# Patient Record
Sex: Male | Born: 1955
Health system: Southern US, Community
[De-identification: ages and names within clinical notes are randomized; demographics above are authoritative.]

## PROBLEM LIST (undated history)

## (undated) DIAGNOSIS — Z9889 Other specified postprocedural states: Secondary | ICD-10-CM

## (undated) DIAGNOSIS — R7303 Prediabetes: Secondary | ICD-10-CM

## (undated) DIAGNOSIS — K219 Gastro-esophageal reflux disease without esophagitis: Secondary | ICD-10-CM

## (undated) DIAGNOSIS — M1990 Unspecified osteoarthritis, unspecified site: Secondary | ICD-10-CM

## (undated) DIAGNOSIS — J189 Pneumonia, unspecified organism: Secondary | ICD-10-CM

## (undated) DIAGNOSIS — R011 Cardiac murmur, unspecified: Secondary | ICD-10-CM

## (undated) DIAGNOSIS — R112 Nausea with vomiting, unspecified: Secondary | ICD-10-CM

## (undated) DIAGNOSIS — I00 Rheumatic fever without heart involvement: Secondary | ICD-10-CM

## (undated) DIAGNOSIS — T8859XA Other complications of anesthesia, initial encounter: Secondary | ICD-10-CM

## (undated) DIAGNOSIS — E782 Mixed hyperlipidemia: Secondary | ICD-10-CM

## (undated) DIAGNOSIS — R42 Dizziness and giddiness: Secondary | ICD-10-CM

## (undated) DIAGNOSIS — I10 Essential (primary) hypertension: Secondary | ICD-10-CM

## (undated) HISTORY — DX: Prediabetes: R73.03

## (undated) HISTORY — DX: Gastro-esophageal reflux disease without esophagitis: K21.9

## (undated) HISTORY — DX: Cardiac murmur, unspecified: R01.1

## (undated) HISTORY — DX: Unspecified osteoarthritis, unspecified site: M19.90

## (undated) HISTORY — DX: Mixed hyperlipidemia: E78.2

## (undated) HISTORY — DX: Rheumatic fever without heart involvement: I00

---
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
## 1993-06-14 HISTORY — PX: SPINE SURGERY: SHX786

## 2010-06-14 HISTORY — PX: BACK SURGERY: SHX140

## 2011-06-15 HISTORY — PX: JOINT REPLACEMENT: SHX530

## 2014-05-01 ENCOUNTER — Ambulatory Visit (INDEPENDENT_AMBULATORY_CARE_PROVIDER_SITE_OTHER): Payer: 59 | Admitting: Family

## 2014-05-01 ENCOUNTER — Encounter: Payer: Self-pay | Admitting: Family

## 2014-05-01 VITALS — BP 140/92 | HR 63 | Temp 98.3°F | Resp 18 | Ht 74.0 in | Wt 212.1 lb

## 2014-05-01 DIAGNOSIS — M545 Low back pain, unspecified: Secondary | ICD-10-CM

## 2014-05-01 DIAGNOSIS — N200 Calculus of kidney: Secondary | ICD-10-CM

## 2014-05-01 LAB — POCT URINALYSIS DIPSTICK
BILIRUBIN UA: NEGATIVE
Blood, UA: NEGATIVE
Glucose, UA: NEGATIVE
KETONES UA: NEGATIVE
LEUKOCYTES UA: NEGATIVE
Protein, UA: NEGATIVE
Spec Grav, UA: 1.015
Urobilinogen, UA: NEGATIVE
pH, UA: 6

## 2014-05-01 MED ORDER — METHOCARBAMOL 500 MG PO TABS: 500.0000 mg | ORAL_TABLET | Freq: Four times a day (QID) | ORAL | Status: DC | PRN

## 2014-05-01 NOTE — Assessment & Plan Note (Addendum)
Chronic pain currently managed with lidocaine patch. Appears stable at present. Pt would like referral to neurosurgeon for when he is ready for follow up. Start mobic daily as needed for pain. Start robaxin as needed for sleep. Follow up as needed.

## 2014-05-01 NOTE — Assessment & Plan Note (Signed)
Currently asymptomatic. UA was negative. Continue to monitor. If symptoms develop will refer to urology for further management.

## 2014-05-01 NOTE — Progress Notes (Signed)
Pre visit review using our clinic review tool, if applicable. No additional management support is needed unless otherwise documented below in the visit note. 

## 2014-05-01 NOTE — Patient Instructions (Signed)
Thank you for choosing [facility].  Summary/Instructions:  Your prescription(s) have been submitted to your pharmacy. Please take as directed and contact our office if you believe you are having problem(s) with the medication(s).  Referrals have been made during this visit. You should expect to hear back from our schedulers in about 7-10 days in regards to establishing an appointment with the specialists we discussed.   If your symptoms worsen or fail to improve, please contact our office for further instruction, or in case of emergency go directly to the emergency room at the closest medical facility.   [place] - for your neurosurgery options.  Please start your robaxin as needed and let us know how it works.

## 2014-05-01 NOTE — Progress Notes (Signed)
   Subjective:    Patient ID: Joseph [name], male    DOB: 1955/07/08, 58 y.o.   MRN: [id number] . Chief Complaint  Patient presents with  . Establish Care    Discuss back pain and x-ray results    HPI:  Joseph [name] is a 58 y.o. male who presents today to establish care and discuss back pain and x-ray results.   1) X-ray results - Was recently seen for a disability exam and x-rays showed potential for kidney stone and atherosclerosis of abdominal aorta. Recommended follow up of UA to determine status of kidney stone. Currently not experiencing any urinary symptoms or back pain that is related to kidney stones on either side of his back. Does have back pain from previous surgery and that pain has not changed.   2) Back pain - Describes low back pain in the lumbar region that has been fairly consistent. He is limited in the medications that he can take secondary to intolerances to narcotics. The course of his back pain has remained the same without any improvement or worsening. Was recently prescribed a lidocaine patch which has helped somewhat with the pain. Denies any radiculopathy. Extension of his back makes the pain worse.  Previous history of back surgery -- laminectomy L3 for radiculopathy which relieved his radiating pain,   Allergies  Allergen Reactions  . Other     Intolerance to narcotic medication - causes stomach upset and dizziness and does not relieve pain.    No current outpatient prescriptions on file prior to visit.   No current facility-administered medications on file prior to visit.   Past Medical History  Diagnosis Date  . Arthritis   . Heart murmur   . Rheumatic fever   . GERD (gastroesophageal reflux disease)     Review of Systems    See HPI   Objective:    BP 140/92 mmHg  Pulse 63  Temp(Src) 98.3 F (36.8 C) (Oral)  Resp 18  Ht 6\' 2"  (1.88 m)  Wt 212 lb 1.9 oz (96.217 kg)  BMI 27.22 kg/m2  SpO2 97%  Nursing note and vital signs  reviewed.  Physical Exam  Constitutional: He is oriented to person, place, and time. He appears well-developed and well-nourished. No distress.  Cardiovascular: Normal rate, regular rhythm, normal heart sounds and intact distal pulses.   Pulmonary/Chest: Effort normal and breath sounds normal.  Abdominal: There is no CVA tenderness.  Musculoskeletal:  No obvious discoloration or edema of lower back noted. ROM limited in lumbar extension. All other are intact and appropriate. Sensation and reflexes are intact and appropriate bilaterally. (+) SLR on the right side  Neurological: He is alert and oriented to person, place, and time.  Skin: Skin is warm and dry.  Psychiatric: He has a normal mood and affect. His behavior is normal. Judgment and thought content normal.       Assessment & Plan:

## 2014-07-08 ENCOUNTER — Emergency Department (INDEPENDENT_AMBULATORY_CARE_PROVIDER_SITE_OTHER)
Admission: EM | Admit: 2014-07-08 | Discharge: 2014-07-08 | Disposition: A | Discharge status: Home / Self Care | Payer: 59 | Source: Home / Self Care | Attending: Emergency Medicine | Admitting: Emergency Medicine

## 2014-07-08 ENCOUNTER — Encounter (HOSPITAL_COMMUNITY): Payer: Self-pay | Admitting: Emergency Medicine

## 2014-07-08 DIAGNOSIS — B029 Zoster without complications: Secondary | ICD-10-CM | POA: Diagnosis not present

## 2014-07-08 MED ORDER — VALACYCLOVIR HCL 1 G PO TABS: 1000.0000 mg | ORAL_TABLET | Freq: Three times a day (TID) | ORAL | Status: AC

## 2014-07-08 MED ORDER — GABAPENTIN 300 MG PO CAPS: 300.0000 mg | ORAL_CAPSULE | Freq: Three times a day (TID) | ORAL | Status: DC | PRN

## 2014-07-08 NOTE — ED Notes (Signed)
Reports neck pain for 5 days, pain left side of head and into left arm: describes pain as aching.  Patient has a rash to left side of neck and around left ear and into hair

## 2014-07-08 NOTE — Discharge Instructions (Signed)
You likely have shingles. Take Valtrex 1 pill 3 times a day for 7 days. Use gabapentin every 8 hours as needed for pain. You can take 1 g, that 2 extra strength tablets, of Tylenol every 8 hours as needed for pain. You can also take 800 mg, that is 4 over-the-counter ibuprofen tablets, of ibuprofen every 8 hours as needed for pain. You should see improvement by the end of the week.  Follow up here or with her primary care provider as needed.

## 2014-07-08 NOTE — ED Provider Notes (Signed)
CSN: [id number]     Arrival date & time 07/08/14  1107 History   First MD Initiated Contact with Patient 07/08/14 1248     Chief Complaint  Patient presents with  . Neck Pain   (Consider location/radiation/quality/duration/timing/severity/associated sxs/prior Treatment) HPI He is a 59-year-old man here for evaluation of left neck pain and rash. He states 5 days ago he started having pain in the middle and the left side of his neck. It is an achy pain and went on to involve the left side of his head and his left arm as well. He states the pain on the left side of his head is a sensitive type of pain. He also has developed a rash on his left posterior neck, behind his left ear, the left neck, and the left shoulder. He does not remember having chickenpox as a child. He has had 2 cervical spine fusions. No numbness, tingling, weakness in his upper extremities.  Past Medical History  Diagnosis Date  . Arthritis   . Heart murmur   . Rheumatic fever   . GERD (gastroesophageal reflux disease)    Past Surgical History  Procedure Laterality Date  . Spine surgery  1995    C4-5, C5-C6   . Joint replacement Right 2013  . Tonsillectomy  1963   Family History  Problem Relation Age of Onset  . Arthritis Mother     Rheumatoid  . Diabetes Mother   . Arthritis Father   . Hypertension Father   . Diabetes Brother   . Lupus Brother    History  Substance Use Topics  . Smoking status: Former Smoker -- 1.50 packs/day for 20 years    Types: Cigarettes    Quit date: 05/01/1990  . Smokeless tobacco: Never Used  . Alcohol Use: No    Review of Systems  Musculoskeletal: Positive for neck pain.  Skin: Positive for rash.    Allergies  Other  Home Medications   Prior to Admission medications   Medication Sig Start Date End Date Taking? Authorizing Provider  gabapentin (NEURONTIN) 300 MG capsule Take 1 capsule (300 mg total) by mouth 3 (three) times daily as needed. 07/08/14   Melony Overly, MD   lidocaine (LIDODERM) 5 % Place 1 patch onto the skin daily. Remove & Discard patch within 12 hours or as directed by MD    Historical Provider, MD  meloxicam (MOBIC) 15 MG tablet Take 15 mg by mouth daily.    Historical Provider, MD  methocarbamol (ROBAXIN) 500 MG tablet Take 1 tablet (500 mg total) by mouth every 6 (six) hours as needed for muscle spasms. 05/01/14   Mauricio Po, FNP  omeprazole (PRILOSEC) 40 MG capsule Take 40 mg by mouth daily.    Historical Provider, MD  valACYclovir (VALTREX) 1000 MG tablet Take 1 tablet (1,000 mg total) by mouth 3 (three) times daily. 07/08/14 07/22/14  Melony Overly, MD   BP 176/112 mmHg  Pulse 70  Temp(Src) 98.1 F (36.7 C)  Resp 16  SpO2 100% Physical Exam  Constitutional: He is oriented to person, place, and time. He appears well-developed and well-nourished. No distress.  Neck: Neck supple.  Spurling causes neck pain on the left, no radiating pain.  Cardiovascular: Normal rate.   Pulmonary/Chest: Effort normal.  Neurological: He is alert and oriented to person, place, and time. He has normal strength. He exhibits normal muscle tone.  Reflex Scores:      Bicep reflexes are 2+ on the right side and  2+ on the left side.      Brachioradialis reflexes are 2+ on the right side and 2+ on the left side. Skin:  Multiple areas of erythema with overlying vesicles/papules. Located in the left posterior hairline, behind left ear, anterior to left ear, left lateral neck, left shoulder.    ED Course  Procedures (including critical care time) Labs Review Labs Reviewed - No data to display  Imaging Review No results found.   MDM   1. Shingles    Neuro exam is normal. We'll treat with Valtrex for 1 week. Gabapentin 3 times a day as needed for pain. He declined narcotic medication for pain. Reviewed doses of Tylenol and ibuprofen as in after visit summary. Follow-up as needed.    Melony Overly, MD 07/08/14 1345

## 2014-07-10 ENCOUNTER — Ambulatory Visit (INDEPENDENT_AMBULATORY_CARE_PROVIDER_SITE_OTHER): Payer: 59 | Admitting: Family

## 2014-07-10 ENCOUNTER — Encounter: Payer: Self-pay | Admitting: Family

## 2014-07-10 VITALS — BP 150/98 | HR 63 | Temp 98.0°F | Resp 18 | Ht 74.0 in | Wt 213.4 lb

## 2014-07-10 DIAGNOSIS — B029 Zoster without complications: Secondary | ICD-10-CM | POA: Diagnosis not present

## 2014-07-10 MED ORDER — ACETAMINOPHEN-CODEINE #3 300-30 MG PO TABS: 1.0000 | ORAL_TABLET | ORAL | Status: DC | PRN

## 2014-07-10 NOTE — Assessment & Plan Note (Signed)
Symptoms and exam consistent with herpes zoster. Already on Valtrex and gabapentin. Continue current dosage of Valtrex and gabapentin. Start Tylenol #3 as tolerated and needed for pain. Follow up if symptoms worsen or fail to improve.

## 2014-07-10 NOTE — Progress Notes (Signed)
Pre visit review using our clinic review tool, if applicable. No additional management support is needed unless otherwise documented below in the visit note. 

## 2014-07-10 NOTE — Patient Instructions (Signed)
Thank you for choosing [facility].  Summary/Instructions:  If your symptoms worsen or fail to improve, please contact our office for further instruction, or in case of emergency go directly to the emergency room at the closest medical facility.    Shingles Shingles (herpes zoster) is an infection that is caused by the same virus that causes chickenpox (varicella). The infection causes a painful skin rash and fluid-filled blisters, which eventually break open, crust over, and heal. It may occur in any area of the body, but it usually affects only one side of the body or face. The pain of shingles usually lasts about 1 month. However, some people with shingles may develop long-term (chronic) pain in the affected area of the body. Shingles often occurs many years after the person had chickenpox. It is more common:  In people older than 50 years.  In people with weakened immune systems, such as those with HIV, AIDS, or cancer.  In people taking medicines that weaken the immune system, such as transplant medicines.  In people under great stress. CAUSES  Shingles is caused by the varicella zoster virus (VZV), which also causes chickenpox. After a person is infected with the virus, it can remain in the person's body for years in an inactive state (dormant). To cause shingles, the virus reactivates and breaks out as an infection in a nerve root. The virus can be spread from person to person (contagious) through contact with open blisters of the shingles rash. It will only spread to people who have not had chickenpox. When these people are exposed to the virus, they may develop chickenpox. They will not develop shingles. Once the blisters scab over, the person is no longer contagious and cannot spread the virus to others. SIGNS AND SYMPTOMS  Shingles shows up in stages. The initial symptoms may be pain, itching, and tingling in an area of the skin. This pain is usually described as burning,  stabbing, or throbbing.In a few days or weeks, a painful red rash will appear in the area where the pain, itching, and tingling were felt. The rash is usually on one side of the body in a band or belt-like pattern. Then, the rash usually turns into fluid-filled blisters. They will scab over and dry up in approximately 2-3 weeks. Flu-like symptoms may also occur with the initial symptoms, the rash, or the blisters. These may include:  Fever.  Chills.  Headache.  Upset stomach. DIAGNOSIS  Your health care provider will perform a skin exam to diagnose shingles. Skin scrapings or fluid samples may also be taken from the blisters. This sample will be examined under a microscope or sent to a lab for further testing. TREATMENT  There is no specific cure for shingles. Your health care provider will likely prescribe medicines to help you manage the pain, recover faster, and avoid long-term problems. This may include antiviral drugs, anti-inflammatory drugs, and pain medicines. HOME CARE INSTRUCTIONS   Take a cool bath or apply cool compresses to the area of the rash or blisters as directed. This may help with the pain and itching.   Take medicines only as directed by your health care provider.   Rest as directed by your health care provider.  Keep your rash and blisters clean with mild soap and cool water or as directed by your health care provider.  Do not pick your blisters or scratch your rash. Apply an anti-itch cream or numbing creams to the affected area as directed by your health care  provider.  Keep your shingles rash covered with a loose bandage (dressing).  Avoid skin contact with:  Babies.   Pregnant women.   Children with eczema.   Elderly people with transplants.   People with chronic illnesses, such as leukemia or AIDS.   Wear loose-fitting clothing to help ease the pain of material rubbing against the rash.  Keep all follow-up visits as directed by your health  care provider.If the area involved is on your face, you may receive a referral for a specialist, such as an eye doctor (ophthalmologist) or an ear, nose, and throat (ENT) doctor. Keeping all follow-up visits will help you avoid eye problems, chronic pain, or disability.  SEEK IMMEDIATE MEDICAL CARE IF:   You have facial pain, pain around the eye area, or loss of feeling on one side of your face.  You have ear pain or ringing in your ear.  You have loss of taste.  Your pain is not relieved with prescribed medicines.   Your redness or swelling spreads.   You have more pain and swelling.  Your condition is worsening or has changed.   You have a fever. MAKE SURE YOU:  Understand these instructions.  Will watch your condition.  Will get help right away if you are not doing well or get worse. Document Released: 05/31/2005 Document Revised: 10/15/2013 Document Reviewed: 01/13/2012 [facility] Patient Information 2015 [place], [place]. This information is not intended to replace advice given to you by your health care provider. Make sure you discuss any questions you have with your health care provider.

## 2014-07-10 NOTE — Progress Notes (Signed)
   Subjective:    Patient ID: Joseph [name], male    DOB: Feb 27, 1956, 58 y.o.   MRN: [id number]  Chief Complaint  Patient presents with  . Shingles    possible shingles, rash that has spread all over neck and back of ear, nerve pain assited with it    HPI:  Joseph [name] is a 59 y.o. male who presents today   Associated symptoms of a vesicular rash that spread from his neck to the back of his ear  been going on for about a week. Indicates that he has been experiencing neuropathic pain in his neck and around the rash. Was seen at an Urgent Care and diagnosed with shingles and started on Valtrex and gabapentin. Pain continues to be of moderate to high intensity.   Allergies  Allergen Reactions  . Other     Intolerance to narcotic medication - causes stomach upset and dizziness and does not relieve pain.     Current Outpatient Prescriptions on File Prior to Visit  Medication Sig Dispense Refill  . gabapentin (NEURONTIN) 300 MG capsule Take 1 capsule (300 mg total) by mouth 3 (three) times daily as needed. 60 capsule 0  . lidocaine (LIDODERM) 5 % Place 1 patch onto the skin daily. Remove & Discard patch within 12 hours or as directed by MD    . meloxicam (MOBIC) 15 MG tablet Take 15 mg by mouth daily.    . methocarbamol (ROBAXIN) 500 MG tablet Take 1 tablet (500 mg total) by mouth every 6 (six) hours as needed for muscle spasms. 10 tablet 0  . omeprazole (PRILOSEC) 40 MG capsule Take 40 mg by mouth daily.    . valACYclovir (VALTREX) 1000 MG tablet Take 1 tablet (1,000 mg total) by mouth 3 (three) times daily. 21 tablet 0   No current facility-administered medications on file prior to visit.     Review of Systems  Constitutional: Negative for fever and chills.  Musculoskeletal: Positive for neck pain.  Skin: Positive for rash.      Objective:    BP 150/98 mmHg  Pulse 63  Temp(Src) 98 F (36.7 C) (Oral)  Resp 18  Ht 6\' 2"  (1.88 m)  Wt 213 lb 6.4 oz (96.798 kg)  BMI  27.39 kg/m2  SpO2 98% Nursing note and vital signs reviewed.  Physical Exam  Constitutional: He is oriented to person, place, and time. He appears well-developed and well-nourished. No distress.  Cardiovascular: Normal rate, regular rhythm, normal heart sounds and intact distal pulses.   Pulmonary/Chest: Effort normal and breath sounds normal.  Neurological: He is alert and oriented to person, place, and time.  Skin: Skin is warm and dry.  Grouped vesicles noted around C2-C3 dermatome.   Psychiatric: He has a normal mood and affect. His behavior is normal. Judgment and thought content normal.       Assessment & Plan:

## 2014-07-22 ENCOUNTER — Other Ambulatory Visit: Payer: Self-pay

## 2014-07-22 ENCOUNTER — Telehealth: Payer: Self-pay | Admitting: Family

## 2014-07-22 MED ORDER — GABAPENTIN 300 MG PO CAPS: 300.0000 mg | ORAL_CAPSULE | Freq: Three times a day (TID) | ORAL | Status: DC | PRN

## 2014-07-22 NOTE — Telephone Encounter (Signed)
Patient is requesting refill on gabapentin.  Patient uses [facility] in [place]

## 2014-07-22 NOTE — Telephone Encounter (Signed)
Rx sent 

## 2014-07-23 ENCOUNTER — Telehealth: Payer: Self-pay | Admitting: Family

## 2014-07-23 NOTE — Telephone Encounter (Signed)
[facility] in [place] is advising that they have not received gabapentin RX. Can we please fax one more time?

## 2014-07-23 NOTE — Telephone Encounter (Signed)
Faxed this morning.

## 2014-08-02 ENCOUNTER — Other Ambulatory Visit: Payer: Self-pay | Admitting: Family

## 2014-08-05 ENCOUNTER — Other Ambulatory Visit: Payer: Self-pay | Admitting: Family

## 2014-08-05 ENCOUNTER — Telehealth: Payer: Self-pay | Admitting: *Deleted

## 2014-08-05 MED ORDER — GABAPENTIN 300 MG PO CAPS: 600.0000 mg | ORAL_CAPSULE | Freq: Three times a day (TID) | ORAL | Status: DC | PRN

## 2014-08-05 NOTE — Telephone Encounter (Signed)
Pt stated that Joseph [name] told him to double up on his gabapentin, and which he did and is running out early. Pt is take 2 tablets three times a day. Will need updated script sent to pharmacy...Johny Chess

## 2014-08-05 NOTE — Telephone Encounter (Signed)
Left msg on triage stating Marya Amsler told him to double up on his gabapentin he has been taking two pills three times a day. Pt is needing up dated script. Per chart it still has 1 pill three times a day. Is this ok to change...Johny Chess

## 2014-08-05 NOTE — Telephone Encounter (Signed)
New prescription sent to pharmacy 

## 2014-09-13 ENCOUNTER — Other Ambulatory Visit: Payer: Self-pay

## 2014-09-13 ENCOUNTER — Telehealth: Payer: Self-pay | Admitting: Family

## 2014-09-13 DIAGNOSIS — M545 Low back pain, unspecified: Secondary | ICD-10-CM

## 2014-09-13 MED ORDER — MELOXICAM 15 MG PO TABS: 15.0000 mg | ORAL_TABLET | Freq: Every day | ORAL | Status: DC

## 2014-09-13 NOTE — Telephone Encounter (Signed)
Patient wants Meloxicam 15mg  prescribed to him. He claims that the provider knows that he takes this medicine but have not prescribed it for him. Does he need to come in for this?

## 2014-09-13 NOTE — Telephone Encounter (Signed)
erx done

## 2014-11-26 ENCOUNTER — Other Ambulatory Visit: Payer: Self-pay | Admitting: Family

## 2014-12-25 ENCOUNTER — Other Ambulatory Visit: Payer: Self-pay | Admitting: Family

## 2015-02-11 ENCOUNTER — Ambulatory Visit (INDEPENDENT_AMBULATORY_CARE_PROVIDER_SITE_OTHER): Payer: 59 | Admitting: Family

## 2015-02-11 ENCOUNTER — Other Ambulatory Visit (INDEPENDENT_AMBULATORY_CARE_PROVIDER_SITE_OTHER): Payer: 59

## 2015-02-11 ENCOUNTER — Encounter: Payer: Self-pay | Admitting: Family

## 2015-02-11 ENCOUNTER — Telehealth: Payer: Self-pay | Admitting: Family

## 2015-02-11 VITALS — BP 148/84 | HR 68 | Temp 98.3°F | Resp 18 | Ht 74.0 in | Wt 218.0 lb

## 2015-02-11 DIAGNOSIS — I10 Essential (primary) hypertension: Secondary | ICD-10-CM

## 2015-02-11 DIAGNOSIS — M545 Low back pain, unspecified: Secondary | ICD-10-CM

## 2015-02-11 LAB — BASIC METABOLIC PANEL
BUN: 19 mg/dL (ref 6–23)
CHLORIDE: 106 meq/L (ref 96–112)
CO2: 28 mEq/L (ref 19–32)
Calcium: 9.3 mg/dL (ref 8.4–10.5)
Creatinine, Ser: 1.27 mg/dL (ref 0.40–1.50)
GFR: 61.36 mL/min (ref >60.00)
Glucose, Bld: 80 mg/dL (ref 70–99)
POTASSIUM: 4.1 meq/L (ref 3.5–5.1)
SODIUM: 140 meq/L (ref 135–145)

## 2015-02-11 MED ORDER — CYCLOBENZAPRINE HCL 10 MG PO TABS: 10.0000 mg | ORAL_TABLET | Freq: Three times a day (TID) | ORAL | Status: DC | PRN

## 2015-02-11 MED ORDER — NEBIVOLOL HCL 10 MG PO TABS: 10.0000 mg | ORAL_TABLET | Freq: Every day | ORAL | Status: DC

## 2015-02-11 NOTE — Telephone Encounter (Signed)
Please inform patient that his kidney function and electrolytes look fine. No further treatments are needed at this time.

## 2015-02-11 NOTE — Progress Notes (Signed)
Pre visit review using our clinic review tool, if applicable. No additional management support is needed unless otherwise documented below in the visit note. 

## 2015-02-11 NOTE — Assessment & Plan Note (Addendum)
Blood pressure has been elevated recently with lifestyle management. Restart nebivolol to improve blood pressure. Follow up in 2 weeks for blood pressure evaluation. Obtain BMET to check kidney function.

## 2015-02-11 NOTE — Assessment & Plan Note (Signed)
Requests change from robaxin to Flexeril. Discontinue robaxin and start flexeril as needed for muscle spasms. Follow up if symptoms worsen or are no longer improved.

## 2015-02-11 NOTE — Progress Notes (Signed)
Subjective:    Patient ID: Joseph [name], male    DOB: 1956/03/22, 59 y.o.   MRN: [id number]  Chief Complaint  Patient presents with  . Hypertension    states that his BP has been up and down alot but its been more up the last couple of weeks    HPI:  Neville Walston is a 59 y.o. male with a PMH of shingles, low back pain and kidney stones who presents today for an acute office visit.   Previously controlled on blood pressure medication and experiencing the associated symptom of increased blood pressure for the last couple of weeks and reports that it waxes and wanes. Also notes that he has been doing more activities and working which increase his blood pressure. Modifying factors previously included Bystolic which he has not taken in a long time.   Allergies  Allergen Reactions  . Other     Intolerance to narcotic medication - causes stomach upset and dizziness and does not relieve pain.     Current Outpatient Prescriptions on File Prior to Visit  Medication Sig Dispense Refill  . gabapentin (NEURONTIN) 300 MG capsule TAKE 2 CAPSULES BY MOUTH 3 TIMES A DAY AS NEEDED 180 capsule 2  . lidocaine (LIDODERM) 5 % Place 1 patch onto the skin daily. Remove & Discard patch within 12 hours or as directed by MD    . meloxicam (MOBIC) 15 MG tablet TAKE 1 TABLET EVERY DAY 30 tablet 2  . omeprazole (PRILOSEC) 40 MG capsule Take 40 mg by mouth daily.     No current facility-administered medications on file prior to visit.    Review of Systems  Constitutional: Negative for fever and chills.  Eyes:       Negative for changes vision.   Respiratory: Negative for chest tightness and shortness of breath.   Cardiovascular: Negative for chest pain, palpitations and leg swelling.  Neurological: Negative for headaches.      Objective:    BP 148/84 mmHg  Pulse 68  Temp(Src) 98.3 F (36.8 C) (Oral)  Resp 18  Ht 6\' 2"  (1.88 m)  Wt 218 lb (98.884 kg)  BMI 27.98 kg/m2  SpO2 97% Nursing  note and vital signs reviewed.  Physical Exam  Constitutional: He is oriented to person, place, and time. He appears well-developed and well-nourished. No distress.  Cardiovascular: Normal rate, regular rhythm, normal heart sounds and intact distal pulses.   Pulmonary/Chest: Effort normal and breath sounds normal.  Neurological: He is alert and oriented to person, place, and time.  Skin: Skin is warm and dry.  Psychiatric: He has a normal mood and affect. His behavior is normal. Judgment and thought content normal.       Assessment & Plan:   Problem List Items Addressed This Visit      Cardiovascular and Mediastinum   Essential hypertension - Primary    Blood pressure has been elevated recently with lifestyle management. Restart nebivolol to improve blood pressure. Follow up in 2 weeks for blood pressure evaluation. Obtain BMET to check kidney function.       Relevant Medications   nebivolol (BYSTOLIC) 10 MG tablet   Other Relevant Orders   Basic Metabolic Panel (BMET)     Other   Low back pain    Requests change from robaxin to Flexeril. Discontinue robaxin and start flexeril as needed for muscle spasms. Follow up if symptoms worsen or are no longer improved.       Relevant Medications  cyclobenzaprine (FLEXERIL) 10 MG tablet

## 2015-02-11 NOTE — Patient Instructions (Addendum)
Thank you for choosing [facility].  Summary/Instructions:  Your prescription(s) have been submitted to your pharmacy or been printed and provided for you. Please take as directed and contact our office if you believe you are having problem(s) with the medication(s) or have any questions.  If your symptoms worsen or fail to improve, please contact our office for further instruction, or in case of emergency go directly to the emergency room at the closest medical facility.   Please continue to monitor your blood pressure at home.

## 2015-02-12 NOTE — Telephone Encounter (Signed)
Pt aware of results 

## 2015-02-12 NOTE — Telephone Encounter (Signed)
LVM for pt to call back.

## 2015-03-14 ENCOUNTER — Telehealth: Payer: Self-pay | Admitting: Family

## 2015-03-14 NOTE — Telephone Encounter (Signed)
Pt called and said that he lost he health ins.  He can not afford his Bysloic . Does Marya Amsler have any suggestion for this pt to get his meds?    Best number [phone]

## 2015-03-14 NOTE — Telephone Encounter (Signed)
There are two different options - the first is to switch medications to something that is cheaper and the second is to contact the drug company for a drug assistance request (this can usually be done through the drug website ([employer])

## 2015-03-27 DIAGNOSIS — R937 Abnormal findings on diagnostic imaging of other parts of musculoskeletal system: Secondary | ICD-10-CM | POA: Diagnosis not present

## 2015-03-27 DIAGNOSIS — M5412 Radiculopathy, cervical region: Secondary | ICD-10-CM | POA: Diagnosis not present

## 2015-03-27 DIAGNOSIS — M65311 Trigger thumb, right thumb: Secondary | ICD-10-CM | POA: Diagnosis not present

## 2015-03-27 DIAGNOSIS — M19019 Primary osteoarthritis, unspecified shoulder: Secondary | ICD-10-CM | POA: Diagnosis not present

## 2015-03-27 DIAGNOSIS — M47812 Spondylosis without myelopathy or radiculopathy, cervical region: Secondary | ICD-10-CM | POA: Diagnosis not present

## 2015-03-28 DIAGNOSIS — M47812 Spondylosis without myelopathy or radiculopathy, cervical region: Secondary | ICD-10-CM | POA: Diagnosis not present

## 2015-03-28 DIAGNOSIS — M19019 Primary osteoarthritis, unspecified shoulder: Secondary | ICD-10-CM | POA: Diagnosis not present

## 2015-03-28 DIAGNOSIS — M65311 Trigger thumb, right thumb: Secondary | ICD-10-CM | POA: Diagnosis not present

## 2015-04-21 ENCOUNTER — Other Ambulatory Visit: Payer: Self-pay | Admitting: Family

## 2015-04-21 NOTE — Telephone Encounter (Signed)
Last refill was 6/14 with quantity of 180 and 2 refills. Please advise

## 2015-04-30 ENCOUNTER — Encounter: Payer: Self-pay | Admitting: Family

## 2015-04-30 ENCOUNTER — Ambulatory Visit (INDEPENDENT_AMBULATORY_CARE_PROVIDER_SITE_OTHER): Payer: Medicare Other | Admitting: Family

## 2015-04-30 VITALS — BP 138/90 | HR 83 | Temp 97.8°F | Resp 18 | Ht 74.0 in | Wt 215.0 lb

## 2015-04-30 DIAGNOSIS — M545 Low back pain, unspecified: Secondary | ICD-10-CM

## 2015-04-30 MED ORDER — MELOXICAM 15 MG PO TABS: 15.0000 mg | ORAL_TABLET | Freq: Every day | ORAL | Status: DC

## 2015-04-30 NOTE — Assessment & Plan Note (Signed)
Symptoms and exam consistent with lumbar disc pathology that has been refractory to home exercise therapy. Obtain MRI. Refer to physical therapy and neurosurgery for further management. Continue current dosage of meloxicam, lidocaine patch, and gabapentin as needed. Continue home exercise therapy with exercises provided. Recommend heat 2-3 times per day or as needed. Follow-up pending physical therapy referral and neurosurgery referral.

## 2015-04-30 NOTE — Progress Notes (Signed)
Subjective:    Patient ID: Joseph [name], male    DOB: 02-Jan-1956, 59 y.o.   MRN: [id number]  Chief Complaint  Patient presents with  . Medication Management    having back pain and wants to know if he can get MRI    HPI:  Joseph [name] is a 59 y.o. male who  has a past medical history of Arthritis; Heart murmur; Rheumatic fever; and GERD (gastroesophageal reflux disease). and presents today for a follow up office visit.    Back issues - Continues to experience the associated symptom of pain located in his lower back and does express some radiculopathy down the left side. The new onset of pain has worsened in the last few months. Describes the pain as interfering with moving and walking. Previous history of micropscopic discectomy in his lower back about 5 years ago. Modifying factors include cyclobenzaprine and gabapentin which which does help with his symptoms. Relieving factors are sitting and resting. Does effect his ADLs and ability to work due to pain. Denies saddle anesthesia or changes to bowel or bladder habits.   Allergies  Allergen Reactions  . Other     Intolerance to narcotic medication - causes stomach upset and dizziness and does not relieve pain.      Current Outpatient Prescriptions on File Prior to Visit  Medication Sig Dispense Refill  . cyclobenzaprine (FLEXERIL) 10 MG tablet Take 1 tablet (10 mg total) by mouth 3 (three) times daily as needed for muscle spasms. 90 tablet 0  . gabapentin (NEURONTIN) 300 MG capsule TAKE 2 CAPSULES BY MOUTH 3 TIMES A DAY AS NEEDED 180 capsule 2  . lidocaine (LIDODERM) 5 % Place 1 patch onto the skin daily. Remove & Discard patch within 12 hours or as directed by MD    . nebivolol (BYSTOLIC) 10 MG tablet Take 1 tablet (10 mg total) by mouth daily. 90 tablet 0  . omeprazole (PRILOSEC) 40 MG capsule Take 40 mg by mouth daily.     No current facility-administered medications on file prior to visit.     Review of Systems    Constitutional: Negative for fever and chills.  Musculoskeletal: Positive for back pain.  Neurological: Negative for weakness and numbness.      Objective:    BP 138/90 mmHg  Pulse 83  Temp(Src) 97.8 F (36.6 C) (Oral)  Resp 18  Ht 6\' 2"  (1.88 m)  Wt 215 lb (97.523 kg)  BMI 27.59 kg/m2  SpO2 97% Nursing note and vital signs reviewed.  Physical Exam  Constitutional: He is oriented to person, place, and time. He appears well-developed and well-nourished. No distress.  Cardiovascular: Normal rate, regular rhythm, normal heart sounds and intact distal pulses.   Pulmonary/Chest: Effort normal and breath sounds normal.  Musculoskeletal:  Low back - no obvious deformity, discoloration, or edema noted. Tenderness elicited along lumbar spine at L4-L5 level. Range of motion is decreased in flexion to approximately half normal. Lateral bending to the left results in discomfort. Straight leg raise is positive bilaterally. Faber's test is negative. Distal pulses, sensation, and reflexes are intact and appropriate.  Neurological: He is alert and oriented to person, place, and time.  Skin: Skin is warm and dry.  Psychiatric: He has a normal mood and affect. His behavior is normal. Judgment and thought content normal.       Assessment & Plan:   Problem List Items Addressed This Visit      Other   Low back pain -  Primary    Symptoms and exam consistent with lumbar disc pathology that has been refractory to home exercise therapy. Obtain MRI. Refer to physical therapy and neurosurgery for further management. Continue current dosage of meloxicam, lidocaine patch, and gabapentin as needed. Continue home exercise therapy with exercises provided. Recommend heat 2-3 times per day or as needed. Follow-up pending physical therapy referral and neurosurgery referral.      Relevant Medications   meloxicam (MOBIC) 15 MG tablet   Other Relevant Orders   MR Lumbar Spine Wo Contrast   Ambulatory referral  to Physical Therapy   Ambulatory referral to Neurosurgery

## 2015-04-30 NOTE — Progress Notes (Signed)
Pre visit review using our clinic review tool, if applicable. No additional management support is needed unless otherwise documented below in the visit note. 

## 2015-04-30 NOTE — Patient Instructions (Signed)
Thank you for choosing [facility].  Summary/Instructions:  Your prescription(s) have been submitted to your pharmacy or been printed and provided for you. Please take as directed and contact our office if you believe you are having problem(s) with the medication(s) or have any questions.  If your symptoms worsen or fail to improve, please contact our office for further instruction, or in case of emergency go directly to the emergency room at the closest medical facility.   Sciatica With Rehab The sciatic nerve runs from the back down the leg and is responsible for sensation and control of the muscles in the back (posterior) side of the thigh, lower leg, and foot. Sciatica is a condition that is characterized by inflammation of this nerve.  SYMPTOMS   Signs of nerve damage, including numbness and/or weakness along the posterior side of the lower extremity.  Pain in the back of the thigh that may also travel down the leg.  Pain that worsens when sitting for long periods of time.  Occasionally, pain in the back or buttock. CAUSES  Inflammation of the sciatic nerve is the cause of sciatica. The inflammation is due to something irritating the nerve. Common sources of irritation include:  Sitting for long periods of time.  Direct trauma to the nerve.  Arthritis of the spine.  Herniated or ruptured disk.  Slipping of the vertebrae (spondylolisthesis).  Pressure from soft tissues, such as muscles or ligament-like tissue (fascia). RISK INCREASES WITH:  Sports that place pressure or stress on the spine (football or weightlifting).  Poor strength and flexibility.  Failure to warm up properly before activity.  Family history of low back pain or disk disorders.  Previous back injury or surgery.  Poor body mechanics, especially when lifting, or poor posture. PREVENTION   Warm up and stretch properly before activity.  Maintain physical fitness:  Strength, flexibility, and  endurance.  Cardiovascular fitness.  Learn and use proper technique, especially with posture and lifting. When possible, have coach correct improper technique.  Avoid activities that place stress on the spine. PROGNOSIS If treated properly, then sciatica usually resolves within 6 weeks. However, occasionally surgery is necessary.  RELATED COMPLICATIONS   Permanent nerve damage, including pain, numbness, tingle, or weakness.  Chronic back pain.  Risks of surgery: infection, bleeding, nerve damage, or damage to surrounding tissues. TREATMENT Treatment initially involves resting from any activities that aggravate your symptoms. The use of ice and medication may help reduce pain and inflammation. The use of strengthening and stretching exercises may help reduce pain with activity. These exercises may be performed at home or with referral to a therapist. A therapist may recommend further treatments, such as transcutaneous electronic nerve stimulation (TENS) or ultrasound. Your caregiver may recommend corticosteroid injections to help reduce inflammation of the sciatic nerve. If symptoms persist despite non-surgical (conservative) treatment, then surgery may be recommended. MEDICATION  If pain medication is necessary, then nonsteroidal anti-inflammatory medications, such as aspirin and ibuprofen, or other minor pain relievers, such as acetaminophen, are often recommended.  Do not take pain medication for 7 days before surgery.  Prescription pain relievers may be given if deemed necessary by your caregiver. Use only as directed and only as much as you need.  Ointments applied to the skin may be helpful.  Corticosteroid injections may be given by your caregiver. These injections should be reserved for the most serious cases, because they may only be given a certain number of times. HEAT AND COLD  Cold treatment (icing) relieves  pain and reduces inflammation. Cold treatment should be applied  for 10 to 15 minutes every 2 to 3 hours for inflammation and pain and immediately after any activity that aggravates your symptoms. Use ice packs or massage the area with a piece of ice (ice massage).  Heat treatment may be used prior to performing the stretching and strengthening activities prescribed by your caregiver, physical therapist, or athletic trainer. Use a heat pack or soak the injury in warm water. SEEK MEDICAL CARE IF:  Treatment seems to offer no benefit, or the condition worsens.  Any medications produce adverse side effects. EXERCISES  RANGE OF MOTION (ROM) AND STRETCHING EXERCISES - Sciatica Most people with sciatic will find that their symptoms worsen with either excessive bending forward (flexion) or arching at the low back (extension). The exercises which will help resolve your symptoms will focus on the opposite motion. Your physician, physical therapist or athletic trainer will help you determine which exercises will be most helpful to resolve your low back pain. Do not complete any exercises without first consulting with your clinician. Discontinue any exercises which worsen your symptoms until you speak to your clinician. If you have pain, numbness or tingling which travels down into your buttocks, leg or foot, the goal of the therapy is for these symptoms to move closer to your back and eventually resolve. Occasionally, these leg symptoms will get better, but your low back pain may worsen; this is typically an indication of progress in your rehabilitation. Be certain to be very alert to any changes in your symptoms and the activities in which you participated in the 24 hours prior to the change. Sharing this information with your clinician will allow him/her to most efficiently treat your condition. These exercises may help you when beginning to rehabilitate your injury. Your symptoms may resolve with or without further involvement from your physician, physical therapist or  athletic trainer. While completing these exercises, remember:   Restoring tissue flexibility helps normal motion to return to the joints. This allows healthier, less painful movement and activity.  An effective stretch should be held for at least 30 seconds.  A stretch should never be painful. You should only feel a gentle lengthening or release in the stretched tissue. FLEXION RANGE OF MOTION AND STRETCHING EXERCISES: STRETCH - Flexion, Single Knee to Chest   Lie on a firm bed or floor with both legs extended in front of you.  Keeping one leg in contact with the floor, bring your opposite knee to your chest. Hold your leg in place by either grabbing behind your thigh or at your knee.  Pull until you feel a gentle stretch in your low back. Hold __________ seconds.  Slowly release your grasp and repeat the exercise with the opposite side. Repeat __________ times. Complete this exercise __________ times per day.  STRETCH - Flexion, Double Knee to Chest  Lie on a firm bed or floor with both legs extended in front of you.  Keeping one leg in contact with the floor, bring your opposite knee to your chest.  Tense your stomach muscles to support your back and then lift your other knee to your chest. Hold your legs in place by either grabbing behind your thighs or at your knees.  Pull both knees toward your chest until you feel a gentle stretch in your low back. Hold __________ seconds.  Tense your stomach muscles and slowly return one leg at a time to the floor. Repeat __________ times. Complete this exercise  __________ times per day.  STRETCH - Low Trunk Rotation   Lie on a firm bed or floor. Keeping your legs in front of you, bend your knees so they are both pointed toward the ceiling and your feet are flat on the floor.  Extend your arms out to the side. This will stabilize your upper body by keeping your shoulders in contact with the floor.  Gently and slowly drop both knees together  to one side until you feel a gentle stretch in your low back. Hold for __________ seconds.  Tense your stomach muscles to support your low back as you bring your knees back to the starting position. Repeat the exercise to the other side. Repeat __________ times. Complete this exercise __________ times per day  EXTENSION RANGE OF MOTION AND FLEXIBILITY EXERCISES: STRETCH - Extension, Prone on Elbows  Lie on your stomach on the floor, a bed will be too soft. Place your palms about shoulder width apart and at the height of your head.  Place your elbows under your shoulders. If this is too painful, stack pillows under your chest.  Allow your body to relax so that your hips drop lower and make contact more completely with the floor.  Hold this position for __________ seconds.  Slowly return to lying flat on the floor. Repeat __________ times. Complete this exercise __________ times per day.  RANGE OF MOTION - Extension, Prone Press Ups  Lie on your stomach on the floor, a bed will be too soft. Place your palms about shoulder width apart and at the height of your head.  Keeping your back as relaxed as possible, slowly straighten your elbows while keeping your hips on the floor. You may adjust the placement of your hands to maximize your comfort. As you gain motion, your hands will come more underneath your shoulders.  Hold this position __________ seconds.  Slowly return to lying flat on the floor. Repeat __________ times. Complete this exercise __________ times per day.  STRENGTHENING EXERCISES - Sciatica  These exercises may help you when beginning to rehabilitate your injury. These exercises should be done near your "sweet spot." This is the neutral, low-back arch, somewhere between fully rounded and fully arched, that is your least painful position. When performed in this safe range of motion, these exercises can be used for people who have either a flexion or extension based injury. These  exercises may resolve your symptoms with or without further involvement from your physician, physical therapist or athletic trainer. While completing these exercises, remember:   Muscles can gain both the endurance and the strength needed for everyday activities through controlled exercises.  Complete these exercises as instructed by your physician, physical therapist or athletic trainer. Progress with the resistance and repetition exercises only as your caregiver advises.  You may experience muscle soreness or fatigue, but the pain or discomfort you are trying to eliminate should never worsen during these exercises. If this pain does worsen, stop and make certain you are following the directions exactly. If the pain is still present after adjustments, discontinue the exercise until you can discuss the trouble with your clinician. STRENGTHENING - Deep Abdominals, Pelvic Tilt   Lie on a firm bed or floor. Keeping your legs in front of you, bend your knees so they are both pointed toward the ceiling and your feet are flat on the floor.  Tense your lower abdominal muscles to press your low back into the floor. This motion will rotate your pelvis so  that your tail bone is scooping upwards rather than pointing at your feet or into the floor.  With a gentle tension and even breathing, hold this position for __________ seconds. Repeat __________ times. Complete this exercise __________ times per day.  STRENGTHENING - Abdominals, Crunches   Lie on a firm bed or floor. Keeping your legs in front of you, bend your knees so they are both pointed toward the ceiling and your feet are flat on the floor. Cross your arms over your chest.  Slightly tip your chin down without bending your neck.  Tense your abdominals and slowly lift your trunk high enough to just clear your shoulder blades. Lifting higher can put excessive stress on the low back and does not further strengthen your abdominal muscles.  Control  your return to the starting position. Repeat __________ times. Complete this exercise __________ times per day.  STRENGTHENING - Quadruped, Opposite UE/LE Lift  Assume a hands and knees position on a firm surface. Keep your hands under your shoulders and your knees under your hips. You may place padding under your knees for comfort.  Find your neutral spine and gently tense your abdominal muscles so that you can maintain this position. Your shoulders and hips should form a rectangle that is parallel with the floor and is not twisted.  Keeping your trunk steady, lift your right hand no higher than your shoulder and then your left leg no higher than your hip. Make sure you are not holding your breath. Hold this position __________ seconds.  Continuing to keep your abdominal muscles tense and your back steady, slowly return to your starting position. Repeat with the opposite arm and leg. Repeat __________ times. Complete this exercise __________ times per day.  STRENGTHENING - Abdominals and Quadriceps, Straight Leg Raise   Lie on a firm bed or floor with both legs extended in front of you.  Keeping one leg in contact with the floor, bend the other knee so that your foot can rest flat on the floor.  Find your neutral spine, and tense your abdominal muscles to maintain your spinal position throughout the exercise.  Slowly lift your straight leg off the floor about 6 inches for a count of 15, making sure to not hold your breath.  Still keeping your neutral spine, slowly lower your leg all the way to the floor. Repeat this exercise with each leg __________ times. Complete this exercise __________ times per day. POSTURE AND BODY MECHANICS CONSIDERATIONS - Sciatica Keeping correct posture when sitting, standing or completing your activities will reduce the stress put on different body tissues, allowing injured tissues a chance to heal and limiting painful experiences. The following are general  guidelines for improved posture. Your physician or physical therapist will provide you with any instructions specific to your needs. While reading these guidelines, remember:  The exercises prescribed by your provider will help you have the flexibility and strength to maintain correct postures.  The correct posture provides the optimal environment for your joints to work. All of your joints have less wear and tear when properly supported by a spine with good posture. This means you will experience a healthier, less painful body.  Correct posture must be practiced with all of your activities, especially prolonged sitting and standing. Correct posture is as important when doing repetitive low-stress activities (typing) as it is when doing a single heavy-load activity (lifting). RESTING POSITIONS Consider which positions are most painful for you when choosing a resting position. If you  have pain with flexion-based activities (sitting, bending, stooping, squatting), choose a position that allows you to rest in a less flexed posture. You would want to avoid curling into a fetal position on your side. If your pain worsens with extension-based activities (prolonged standing, working overhead), avoid resting in an extended position such as sleeping on your stomach. Most people will find more comfort when they rest with their spine in a more neutral position, neither too rounded nor too arched. Lying on a non-sagging bed on your side with a pillow between your knees, or on your back with a pillow under your knees will often provide some relief. Keep in mind, being in any one position for a prolonged period of time, no matter how correct your posture, can still lead to stiffness. PROPER SITTING POSTURE In order to minimize stress and discomfort on your spine, you must sit with correct posture Sitting with good posture should be effortless for a healthy body. Returning to good posture is a gradual process. Many  people can work toward this most comfortably by using various supports until they have the flexibility and strength to maintain this posture on their own. When sitting with proper posture, your ears will fall over your shoulders and your shoulders will fall over your hips. You should use the back of the chair to support your upper back. Your low back will be in a neutral position, just slightly arched. You may place a small pillow or folded towel at the base of your low back for support.  When working at a desk, create an environment that supports good, upright posture. Without extra support, muscles fatigue and lead to excessive strain on joints and other tissues. Keep these recommendations in mind: CHAIR:   A chair should be able to slide under your desk when your back makes contact with the back of the chair. This allows you to work closely.  The chair's height should allow your eyes to be level with the upper part of your monitor and your hands to be slightly lower than your elbows. BODY POSITION  Your feet should make contact with the floor. If this is not possible, use a foot rest.  Keep your ears over your shoulders. This will reduce stress on your neck and low back. INCORRECT SITTING POSTURES   If you are feeling tired and unable to assume a healthy sitting posture, do not slouch or slump. This puts excessive strain on your back tissues, causing more damage and pain. Healthier options include:  Using more support, like a lumbar pillow.  Switching tasks to something that requires you to be upright or walking.  Talking a brief walk.  Lying down to rest in a neutral-spine position. PROLONGED STANDING WHILE SLIGHTLY LEANING FORWARD  When completing a task that requires you to lean forward while standing in one place for a long time, place either foot up on a stationary 2-4 inch high object to help maintain the best posture. When both feet are on the ground, the low back tends to lose its  slight inward curve. If this curve flattens (or becomes too large), then the back and your other joints will experience too much stress, fatigue more quickly and can cause pain.  CORRECT STANDING POSTURES Proper standing posture should be assumed with all daily activities, even if they only take a few moments, like when brushing your teeth. As in sitting, your ears should fall over your shoulders and your shoulders should fall over your hips. You  should keep a slight tension in your abdominal muscles to brace your spine. Your tailbone should point down to the ground, not behind your body, resulting in an over-extended swayback posture.  INCORRECT STANDING POSTURES  Common incorrect standing postures include a forward head, locked knees and/or an excessive swayback. WALKING Walk with an upright posture. Your ears, shoulders and hips should all line-up. PROLONGED ACTIVITY IN A FLEXED POSITION When completing a task that requires you to bend forward at your waist or lean over a low surface, try to find a way to stabilize 3 of 4 of your limbs. You can place a hand or elbow on your thigh or rest a knee on the surface you are reaching across. This will provide you more stability so that your muscles do not fatigue as quickly. By keeping your knees relaxed, or slightly bent, you will also reduce stress across your low back. CORRECT LIFTING TECHNIQUES DO :   Assume a wide stance. This will provide you more stability and the opportunity to get as close as possible to the object which you are lifting.  Tense your abdominals to brace your spine; then bend at the knees and hips. Keeping your back locked in a neutral-spine position, lift using your leg muscles. Lift with your legs, keeping your back straight.  Test the weight of unknown objects before attempting to lift them.  Try to keep your elbows locked down at your sides in order get the best strength from your shoulders when carrying an object.  Always  ask for help when lifting heavy or awkward objects. INCORRECT LIFTING TECHNIQUES DO NOT:   Lock your knees when lifting, even if it is a small object.  Bend and twist. Pivot at your feet or move your feet when needing to change directions.  Assume that you cannot safely pick up a paperclip without proper posture.   This information is not intended to replace advice given to you by your health care provider. Make sure you discuss any questions you have with your health care provider.   Document Released: 05/31/2005 Document Revised: 10/15/2014 Document Reviewed: 09/12/2008 [employer]vier Interactive Patient Education [employer].

## 2015-05-15 ENCOUNTER — Other Ambulatory Visit: Payer: Self-pay | Admitting: Family

## 2015-05-15 DIAGNOSIS — M545 Low back pain, unspecified: Secondary | ICD-10-CM

## 2015-05-23 ENCOUNTER — Telehealth: Payer: Self-pay | Admitting: *Deleted

## 2015-05-23 DIAGNOSIS — M545 Low back pain, unspecified: Secondary | ICD-10-CM

## 2015-05-23 MED ORDER — OMEPRAZOLE 40 MG PO CPDR: 40.0000 mg | DELAYED_RELEASE_CAPSULE | Freq: Every day | ORAL | Status: DC

## 2015-05-23 MED ORDER — MELOXICAM 15 MG PO TABS: 15.0000 mg | ORAL_TABLET | Freq: Every day | ORAL | Status: DC

## 2015-05-23 MED ORDER — GABAPENTIN 300 MG PO CAPS: ORAL_CAPSULE | ORAL | Status: DC

## 2015-05-23 NOTE — Telephone Encounter (Signed)
Received call pt states he has to use mail service "[employer]" for his omeperazole, meloxicam & Gabapentin. Inform pt will send [employer] new rx;'s...Joseph [name]

## 2015-05-26 ENCOUNTER — Other Ambulatory Visit: Payer: Self-pay | Admitting: Family

## 2015-05-28 ENCOUNTER — Ambulatory Visit
Admission: RE | Admit: 2015-05-28 | Discharge: 2015-05-28 | Disposition: A | Discharge status: Home / Self Care | Payer: Medicare Other | Source: Ambulatory Visit | Attending: Family | Admitting: Family

## 2015-05-28 DIAGNOSIS — M545 Low back pain, unspecified: Secondary | ICD-10-CM

## 2015-05-28 DIAGNOSIS — M4806 Spinal stenosis, lumbar region: Secondary | ICD-10-CM | POA: Diagnosis not present

## 2015-05-28 DIAGNOSIS — Z01818 Encounter for other preprocedural examination: Secondary | ICD-10-CM | POA: Diagnosis not present

## 2015-06-04 DIAGNOSIS — Z6827 Body mass index (BMI) 27.0-27.9, adult: Secondary | ICD-10-CM | POA: Diagnosis not present

## 2015-06-04 DIAGNOSIS — M5136 Other intervertebral disc degeneration, lumbar region: Secondary | ICD-10-CM | POA: Diagnosis not present

## 2015-06-04 DIAGNOSIS — I10 Essential (primary) hypertension: Secondary | ICD-10-CM | POA: Diagnosis not present

## 2015-06-19 ENCOUNTER — Telehealth: Payer: Self-pay | Admitting: Family

## 2015-06-19 DIAGNOSIS — M545 Low back pain, unspecified: Secondary | ICD-10-CM

## 2015-06-19 NOTE — Telephone Encounter (Signed)
Pt called in and he needs a refill on his lidocaine (LIDODERM) 5 % [id number] .  He said that the pharmacy told him that he would need a PA for it?  Have you seen this refill or a PA for this ?????

## 2015-06-26 MED ORDER — LIDOCAINE 5 % EX PTCH: 1.0000 | MEDICATED_PATCH | CUTANEOUS | Status: DC

## 2015-06-26 NOTE — Telephone Encounter (Signed)
Pt left msg on triage inquiring about PA status on his Lidoderm. Corrine or Taylor pls contact pt concerning PA...Joseph [name]

## 2015-06-26 NOTE — Telephone Encounter (Signed)
The medication was never sent in by [facility] so we never received PA for it. Joseph [name] ok'd sending the lidoderm in. Sent in the rx and will start on the PA

## 2015-06-27 NOTE — Telephone Encounter (Signed)
PA done for lidocaine patch 5% waiting on response

## 2015-07-04 DIAGNOSIS — M5416 Radiculopathy, lumbar region: Secondary | ICD-10-CM | POA: Diagnosis not present

## 2015-07-04 DIAGNOSIS — M5136 Other intervertebral disc degeneration, lumbar region: Secondary | ICD-10-CM | POA: Diagnosis not present

## 2015-07-04 NOTE — Telephone Encounter (Signed)
Fax received that PA has been denied.   Forwarding to PCP and Assistance for alternative solutions.

## 2015-07-08 NOTE — Telephone Encounter (Signed)
Spoke with pts wife and she advised that pt was in pain management clinic. I made her aware that the pain management clinic should be handling the pain and to let them know that lidocaine patches work best for him. Pts wife is going to call Pain management. They are aware PA has been denied.

## 2015-07-08 NOTE — Telephone Encounter (Signed)
Pt left msg on triage stating needing status on Lidocaine Patches. MD order 2 weeks ago no one has called him to inform him of anything. Lovena Le will you contact pt concerning PA...Johny Chess

## 2015-07-28 DIAGNOSIS — M5136 Other intervertebral disc degeneration, lumbar region: Secondary | ICD-10-CM | POA: Diagnosis not present

## 2015-07-28 DIAGNOSIS — M5416 Radiculopathy, lumbar region: Secondary | ICD-10-CM | POA: Diagnosis not present

## 2015-08-14 ENCOUNTER — Other Ambulatory Visit: Payer: Self-pay | Admitting: Family

## 2015-08-18 DIAGNOSIS — M5416 Radiculopathy, lumbar region: Secondary | ICD-10-CM | POA: Diagnosis not present

## 2015-09-10 DIAGNOSIS — M5136 Other intervertebral disc degeneration, lumbar region: Secondary | ICD-10-CM | POA: Diagnosis not present

## 2015-09-10 DIAGNOSIS — M5416 Radiculopathy, lumbar region: Secondary | ICD-10-CM | POA: Diagnosis not present

## 2015-09-19 DIAGNOSIS — M5416 Radiculopathy, lumbar region: Secondary | ICD-10-CM | POA: Diagnosis not present

## 2015-09-30 DIAGNOSIS — M5416 Radiculopathy, lumbar region: Secondary | ICD-10-CM | POA: Diagnosis not present

## 2015-10-21 ENCOUNTER — Encounter: Payer: Self-pay | Admitting: Family

## 2015-10-21 ENCOUNTER — Ambulatory Visit (INDEPENDENT_AMBULATORY_CARE_PROVIDER_SITE_OTHER): Payer: Medicare Other | Admitting: Family

## 2015-10-21 VITALS — BP 148/92 | HR 72 | Temp 98.2°F | Resp 16 | Ht 74.0 in | Wt 215.0 lb

## 2015-10-21 DIAGNOSIS — G629 Polyneuropathy, unspecified: Secondary | ICD-10-CM

## 2015-10-21 DIAGNOSIS — M545 Low back pain, unspecified: Secondary | ICD-10-CM

## 2015-10-21 NOTE — Patient Instructions (Addendum)
Thank you for choosing [facility].  Summary/Instructions:  Please continue to take the medications as prescribed.   They will call to schedule your appointment with the [facility].   If your symptoms worsen or fail to improve, please contact our office for further instruction, or in case of emergency go directly to the emergency room at the closest medical facility.

## 2015-10-21 NOTE — Progress Notes (Signed)
Pre visit review using our clinic review tool, if applicable. No additional management support is needed unless otherwise documented below in the visit note. 

## 2015-10-21 NOTE — Assessment & Plan Note (Signed)
Continues to experience low back pain despite pain interventions with worsening of symptoms. Continue current dosage of cyclobenzaprine and gabapentin. Continue lidocaine patches as needed. Has failed treatment with Lyrica. Continue ice/heat and home exercise therapy and follow up pending referral.

## 2015-10-21 NOTE — Assessment & Plan Note (Signed)
New onset neuropathy/radiculopathy since most recent spinal injection and confirmed nerve irritation on nerve conduction study from pain management. Gabapentin does help. Would like referral to back specialist with referral placed to [facility].

## 2015-10-21 NOTE — Progress Notes (Signed)
Subjective:    Patient ID: Joseph [name], male    DOB: 15-Oct-1955, 60 y.o.   MRN: [id number]  Chief Complaint  Patient presents with  . Back Pain    says that an injection that was given to him messed his back up and he wants to know what to do from here, right lower back    HPI:  Joseph [name] is a 60 y.o. male who  has a past medical history of Arthritis; Heart murmur; Rheumatic fever; and GERD (gastroesophageal reflux disease). and presents today for a follow up office visit.   Previously evaluated in the office for lumbar disc pathology that was refractory to conservative treatment including home exercise therapy. Has been seen by neurosurgery and pain management where he received injections into his lumbar spine. Reports that he notes extreme pain in his right leg during the injection which has continued to occur. Has returned to see Dr. Maryjean Ka with a nerve conduction test which showed that he had an "irritated nerve" that there was nothing that they could do anything. Modifying factors include gabapentin which has helped a little with his symptoms.   Allergies  Allergen Reactions  . Other     Intolerance to narcotic medication - causes stomach upset and dizziness and does not relieve pain.      Current Outpatient Prescriptions on File Prior to Visit  Medication Sig Dispense Refill  . cyclobenzaprine (FLEXERIL) 10 MG tablet Take 1 tablet (10 mg total) by mouth 3 (three) times daily as needed for muscle spasms. 90 tablet 0  . gabapentin (NEURONTIN) 300 MG capsule TAKE 2 CAPSULES BY MOUTH 3 TIMES A DAY AS NEEDED 540 capsule 1  . lidocaine (LIDODERM) 5 % Place 1 patch onto the skin daily. Remove & Discard patch within 12 hours or as directed by MD 30 patch 0  . meloxicam (MOBIC) 15 MG tablet TAKE 1 TABLET (15 MG TOTAL) BY MOUTH DAILY. 90 tablet 1  . omeprazole (PRILOSEC) 40 MG capsule TAKE 1 CAPSULE (40 MG TOTAL) BY MOUTH DAILY. 90 capsule 1   No current facility-administered  medications on file prior to visit.     Past Surgical History  Procedure Laterality Date  . Spine surgery  1995    C4-5, C5-C6   . Joint replacement Right 2013  . Tonsillectomy  1963    Review of Systems  Constitutional: Negative for fever and chills.  Musculoskeletal: Positive for back pain.  Neurological: Positive for numbness. Negative for weakness.      Objective:    BP 148/92 mmHg  Pulse 72  Temp(Src) 98.2 F (36.8 C) (Oral)  Resp 16  Ht 6\' 2"  (1.88 m)  Wt 215 lb (97.523 kg)  BMI 27.59 kg/m2  SpO2 95% Nursing note and vital signs reviewed.  Physical Exam  Constitutional: He is oriented to person, place, and time. He appears well-developed and well-nourished. No distress.  Cardiovascular: Normal rate, regular rhythm, normal heart sounds and intact distal pulses.   Pulmonary/Chest: Effort normal and breath sounds normal.  Musculoskeletal:  Low back - no obvious deformity, discoloration, or edema noted. Tenderness elicited along lumbar spine and right side paraspinal musculature. Range of motion is limited secondary to pain. Straight leg raise is positive on the right side. Distal pulses and sensation are intact and appropriate.  Neurological: He is alert and oriented to person, place, and time.  Skin: Skin is warm and dry.  Psychiatric: He has a normal mood and affect. His behavior is normal.  Judgment and thought content normal.       Assessment & Plan:   Problem List Items Addressed This Visit    None       I have discontinued Mr. Rothgeb's nebivolol. I am also having him maintain his cyclobenzaprine, gabapentin, lidocaine, omeprazole, and meloxicam.   No orders of the defined types were placed in this encounter.     Follow-up: No Follow-up on file.  Mauricio Po, FNP

## 2015-10-31 ENCOUNTER — Encounter: Payer: Self-pay | Admitting: Internal Medicine

## 2015-10-31 ENCOUNTER — Ambulatory Visit (INDEPENDENT_AMBULATORY_CARE_PROVIDER_SITE_OTHER): Payer: Medicare Other | Admitting: Internal Medicine

## 2015-10-31 VITALS — BP 126/80 | HR 74 | Resp 20 | Wt 208.0 lb

## 2015-10-31 DIAGNOSIS — I10 Essential (primary) hypertension: Secondary | ICD-10-CM | POA: Diagnosis not present

## 2015-10-31 DIAGNOSIS — R1310 Dysphagia, unspecified: Secondary | ICD-10-CM | POA: Diagnosis not present

## 2015-10-31 DIAGNOSIS — K219 Gastro-esophageal reflux disease without esophagitis: Secondary | ICD-10-CM

## 2015-10-31 NOTE — Progress Notes (Signed)
Pre visit review using our clinic review tool, if applicable. No additional management support is needed unless otherwise documented below in the visit note. 

## 2015-10-31 NOTE — Progress Notes (Signed)
   Subjective:    Patient ID: Joseph [name], male    DOB: 26-Sep-1955, 60 y.o.   MRN: [id number]  HPI  Here with onset 2-3 mo worsening of difficulty swallowing, which seemed to culminate in simply being unable to eat or drink at all today, Mylanta not helping.  Tried solids last PM, and fluids that seem to get caught about mid chest and results in immediate vomiting.  Has had mild worsening reflux over last few months, but no other abd pain,  bowel change or blood.  Pt denies fever, wt loss, night sweats, loss of appetite, or other constitutional symptoms  Has lost wt recently. Wt Readings from Last 3 Encounters:  10/31/15 208 lb (94.348 kg)  10/21/15 215 lb (97.523 kg)  04/30/15 215 lb (97.523 kg)   Past Medical History  Diagnosis Date  . Arthritis   . Heart murmur   . Rheumatic fever   . GERD (gastroesophageal reflux disease)    Past Surgical History  Procedure Laterality Date  . Spine surgery  1995    C4-5, C5-C6   . Joint replacement Right 2013  . Tonsillectomy  1963    reports that he quit smoking about 25 years ago. His smoking use included Cigarettes. He has a 30 pack-year smoking history. He has never used smokeless tobacco. He reports that he does not drink alcohol or use illicit drugs. family history includes Arthritis in his father and mother; Diabetes in his brother and mother; Hypertension in his father; Lupus in his brother. Allergies  Allergen Reactions  . Other     Intolerance to narcotic medication - causes stomach upset and dizziness and does not relieve pain.     Review of Systems All otherwise neg per pt    Objective:   Physical Exam BP 126/80 mmHg  Pulse 74  Resp 20  Wt 208 lb (94.348 kg)  SpO2 94% VS noted,  Constitutional: Pt appears in no apparent distress HENT: Head: NCAT.  Right Ear: External ear normal.  Left Ear: External ear normal.  Eyes: . Pupils are equal, round, and reactive to light. Conjunctivae and EOM are normal Neck: Normal range  of motion. Neck supple.  Cardiovascular: Normal rate and regular rhythm.   Pulmonary/Chest: Effort normal and breath sounds without rales or wheezing.  Abd:  Soft, NT, ND, + BS Neurological: Pt is alert. Not confused , motor grossly intact Skin: Skin is warm. No rash, no LE edema Psychiatric: Pt behavior is normal. No agitation.     Assessment & Plan:

## 2015-10-31 NOTE — Assessment & Plan Note (Signed)
Quite severe, simply unable to tolerate any amount of solid or liquid since last PM without vomiting, have high suspicion for mechanical obstruttion, less likely functional, for ER referral now

## 2015-10-31 NOTE — Patient Instructions (Signed)
Please go to ER now for further evaluation  Please continue all other medications as before  Please have the pharmacy call with any other refills you may need.  Please keep your appointments with your specialists as you may have planned

## 2015-10-31 NOTE — Assessment & Plan Note (Signed)
To cont PPI,  to f/u any worsening symptoms or concerns  

## 2015-10-31 NOTE — Assessment & Plan Note (Signed)
stable overall by history and exam, recent data reviewed with pt, and pt to continue medical treatment as before,  to f/u any worsening symptoms or concerns BP Readings from Last 3 Encounters:  10/31/15 126/80  10/21/15 148/92  04/30/15 138/90

## 2015-11-04 DIAGNOSIS — R195 Other fecal abnormalities: Secondary | ICD-10-CM | POA: Diagnosis not present

## 2015-11-04 DIAGNOSIS — M5431 Sciatica, right side: Secondary | ICD-10-CM | POA: Diagnosis not present

## 2015-11-04 DIAGNOSIS — Z1211 Encounter for screening for malignant neoplasm of colon: Secondary | ICD-10-CM | POA: Diagnosis not present

## 2015-11-04 DIAGNOSIS — M4806 Spinal stenosis, lumbar region: Secondary | ICD-10-CM | POA: Diagnosis not present

## 2015-11-04 DIAGNOSIS — M5416 Radiculopathy, lumbar region: Secondary | ICD-10-CM | POA: Diagnosis not present

## 2015-11-04 DIAGNOSIS — M549 Dorsalgia, unspecified: Secondary | ICD-10-CM | POA: Diagnosis not present

## 2015-11-04 DIAGNOSIS — R1314 Dysphagia, pharyngoesophageal phase: Secondary | ICD-10-CM | POA: Diagnosis not present

## 2015-11-04 DIAGNOSIS — M4726 Other spondylosis with radiculopathy, lumbar region: Secondary | ICD-10-CM | POA: Diagnosis not present

## 2015-11-17 DIAGNOSIS — M5116 Intervertebral disc disorders with radiculopathy, lumbar region: Secondary | ICD-10-CM | POA: Diagnosis not present

## 2015-11-17 DIAGNOSIS — M4806 Spinal stenosis, lumbar region: Secondary | ICD-10-CM | POA: Diagnosis not present

## 2015-11-17 DIAGNOSIS — M5117 Intervertebral disc disorders with radiculopathy, lumbosacral region: Secondary | ICD-10-CM | POA: Diagnosis not present

## 2015-11-17 DIAGNOSIS — M961 Postlaminectomy syndrome, not elsewhere classified: Secondary | ICD-10-CM | POA: Diagnosis not present

## 2015-11-17 DIAGNOSIS — M5126 Other intervertebral disc displacement, lumbar region: Secondary | ICD-10-CM | POA: Diagnosis not present

## 2015-11-17 DIAGNOSIS — M47812 Spondylosis without myelopathy or radiculopathy, cervical region: Secondary | ICD-10-CM | POA: Diagnosis not present

## 2015-11-17 DIAGNOSIS — N281 Cyst of kidney, acquired: Secondary | ICD-10-CM | POA: Diagnosis not present

## 2015-11-17 DIAGNOSIS — M5021 Other cervical disc displacement,  high cervical region: Secondary | ICD-10-CM | POA: Diagnosis not present

## 2015-11-17 DIAGNOSIS — Z9889 Other specified postprocedural states: Secondary | ICD-10-CM | POA: Diagnosis not present

## 2015-11-17 DIAGNOSIS — M4726 Other spondylosis with radiculopathy, lumbar region: Secondary | ICD-10-CM | POA: Diagnosis not present

## 2015-11-20 DIAGNOSIS — Z6826 Body mass index (BMI) 26.0-26.9, adult: Secondary | ICD-10-CM | POA: Diagnosis not present

## 2015-11-20 DIAGNOSIS — M4726 Other spondylosis with radiculopathy, lumbar region: Secondary | ICD-10-CM | POA: Diagnosis not present

## 2015-11-20 DIAGNOSIS — M4722 Other spondylosis with radiculopathy, cervical region: Secondary | ICD-10-CM | POA: Diagnosis not present

## 2015-11-20 DIAGNOSIS — Q8509 Other neurofibromatosis: Secondary | ICD-10-CM | POA: Diagnosis not present

## 2015-11-25 DIAGNOSIS — R1310 Dysphagia, unspecified: Secondary | ICD-10-CM | POA: Diagnosis not present

## 2015-11-25 DIAGNOSIS — K649 Unspecified hemorrhoids: Secondary | ICD-10-CM | POA: Diagnosis not present

## 2015-11-25 DIAGNOSIS — K210 Gastro-esophageal reflux disease with esophagitis: Secondary | ICD-10-CM | POA: Diagnosis not present

## 2015-11-25 DIAGNOSIS — R195 Other fecal abnormalities: Secondary | ICD-10-CM | POA: Diagnosis not present

## 2015-11-25 DIAGNOSIS — K209 Esophagitis, unspecified: Secondary | ICD-10-CM | POA: Diagnosis not present

## 2015-11-25 DIAGNOSIS — D125 Benign neoplasm of sigmoid colon: Secondary | ICD-10-CM | POA: Diagnosis not present

## 2015-11-25 DIAGNOSIS — R194 Change in bowel habit: Secondary | ICD-10-CM | POA: Diagnosis not present

## 2015-11-25 DIAGNOSIS — K2950 Unspecified chronic gastritis without bleeding: Secondary | ICD-10-CM | POA: Diagnosis not present

## 2015-11-26 DIAGNOSIS — K224 Dyskinesia of esophagus: Secondary | ICD-10-CM | POA: Diagnosis not present

## 2015-11-26 DIAGNOSIS — K219 Gastro-esophageal reflux disease without esophagitis: Secondary | ICD-10-CM | POA: Diagnosis not present

## 2015-11-26 DIAGNOSIS — R1310 Dysphagia, unspecified: Secondary | ICD-10-CM | POA: Diagnosis not present

## 2015-11-26 DIAGNOSIS — R001 Bradycardia, unspecified: Secondary | ICD-10-CM | POA: Diagnosis not present

## 2015-11-26 DIAGNOSIS — Z8719 Personal history of other diseases of the digestive system: Secondary | ICD-10-CM | POA: Diagnosis not present

## 2015-11-26 DIAGNOSIS — J029 Acute pharyngitis, unspecified: Secondary | ICD-10-CM | POA: Diagnosis not present

## 2015-12-08 DIAGNOSIS — Z6827 Body mass index (BMI) 27.0-27.9, adult: Secondary | ICD-10-CM | POA: Diagnosis not present

## 2015-12-08 DIAGNOSIS — I10 Essential (primary) hypertension: Secondary | ICD-10-CM | POA: Diagnosis not present

## 2015-12-08 DIAGNOSIS — R200 Anesthesia of skin: Secondary | ICD-10-CM | POA: Diagnosis not present

## 2015-12-08 DIAGNOSIS — M5416 Radiculopathy, lumbar region: Secondary | ICD-10-CM | POA: Diagnosis not present

## 2015-12-08 DIAGNOSIS — R202 Paresthesia of skin: Secondary | ICD-10-CM | POA: Diagnosis not present

## 2015-12-12 DIAGNOSIS — M47816 Spondylosis without myelopathy or radiculopathy, lumbar region: Secondary | ICD-10-CM | POA: Insufficient documentation

## 2015-12-12 DIAGNOSIS — G5711 Meralgia paresthetica, right lower limb: Secondary | ICD-10-CM | POA: Diagnosis not present

## 2015-12-12 DIAGNOSIS — M48061 Spinal stenosis, lumbar region without neurogenic claudication: Secondary | ICD-10-CM | POA: Insufficient documentation

## 2015-12-12 DIAGNOSIS — M48062 Spinal stenosis, lumbar region with neurogenic claudication: Secondary | ICD-10-CM | POA: Insufficient documentation

## 2015-12-12 DIAGNOSIS — M5136 Other intervertebral disc degeneration, lumbar region: Secondary | ICD-10-CM | POA: Insufficient documentation

## 2015-12-12 DIAGNOSIS — M1288 Other specific arthropathies, not elsewhere classified, other specified site: Secondary | ICD-10-CM | POA: Diagnosis not present

## 2015-12-12 DIAGNOSIS — D3610 Benign neoplasm of peripheral nerves and autonomic nervous system, unspecified: Secondary | ICD-10-CM | POA: Insufficient documentation

## 2015-12-12 DIAGNOSIS — M4806 Spinal stenosis, lumbar region: Secondary | ICD-10-CM | POA: Diagnosis not present

## 2015-12-12 DIAGNOSIS — G5710 Meralgia paresthetica, unspecified lower limb: Secondary | ICD-10-CM | POA: Insufficient documentation

## 2016-01-15 DIAGNOSIS — M545 Low back pain: Secondary | ICD-10-CM | POA: Diagnosis not present

## 2016-01-15 DIAGNOSIS — M4806 Spinal stenosis, lumbar region: Secondary | ICD-10-CM | POA: Diagnosis not present

## 2016-01-15 DIAGNOSIS — M47892 Other spondylosis, cervical region: Secondary | ICD-10-CM | POA: Diagnosis not present

## 2016-01-15 DIAGNOSIS — M5136 Other intervertebral disc degeneration, lumbar region: Secondary | ICD-10-CM | POA: Diagnosis not present

## 2016-01-15 DIAGNOSIS — M4712 Other spondylosis with myelopathy, cervical region: Secondary | ICD-10-CM | POA: Insufficient documentation

## 2016-02-09 DIAGNOSIS — M19049 Primary osteoarthritis, unspecified hand: Secondary | ICD-10-CM | POA: Insufficient documentation

## 2016-02-09 DIAGNOSIS — M79641 Pain in right hand: Secondary | ICD-10-CM | POA: Insufficient documentation

## 2016-02-09 DIAGNOSIS — M19041 Primary osteoarthritis, right hand: Secondary | ICD-10-CM | POA: Diagnosis not present

## 2016-02-23 ENCOUNTER — Other Ambulatory Visit: Payer: Self-pay | Admitting: Family

## 2016-07-14 ENCOUNTER — Encounter: Payer: Self-pay | Admitting: Family

## 2016-07-14 ENCOUNTER — Other Ambulatory Visit (INDEPENDENT_AMBULATORY_CARE_PROVIDER_SITE_OTHER): Payer: Medicare Other

## 2016-07-14 ENCOUNTER — Ambulatory Visit (INDEPENDENT_AMBULATORY_CARE_PROVIDER_SITE_OTHER): Payer: Medicare Other | Admitting: Family

## 2016-07-14 DIAGNOSIS — R42 Dizziness and giddiness: Secondary | ICD-10-CM | POA: Diagnosis not present

## 2016-07-14 LAB — CBC WITH DIFFERENTIAL/PLATELET
BASOS ABS: 0.1 10*3/uL (ref 0.0–0.1)
Basophils Relative: 1 % (ref 0.0–3.0)
EOS ABS: 0.1 10*3/uL (ref 0.0–0.7)
Eosinophils Relative: 2.3 % (ref 0.0–5.0)
HCT: 46.7 % (ref 39.0–52.0)
Hemoglobin: 15.9 g/dL (ref 13.0–17.0)
Lymphocytes Relative: 31.5 % (ref 12.0–46.0)
Lymphs Abs: 1.8 10*3/uL (ref 0.7–4.0)
MCHC: 34 g/dL (ref 30.0–36.0)
MCV: 90 fl (ref 78.0–100.0)
MONO ABS: 0.4 10*3/uL (ref 0.1–1.0)
Monocytes Relative: 6.9 % (ref 3.0–12.0)
Neutro Abs: 3.3 10*3/uL (ref 1.4–7.7)
Neutrophils Relative %: 58.3 % (ref 43.0–77.0)
PLATELETS: 216 10*3/uL (ref 150.0–400.0)
RBC: 5.18 Mil/uL (ref 4.22–5.81)
RDW: 13.6 % (ref 11.5–15.5)
WBC: 5.7 10*3/uL (ref 4.0–10.5)

## 2016-07-14 LAB — COMPREHENSIVE METABOLIC PANEL
ALK PHOS: 76 U/L (ref 39–117)
ALT: 15 U/L (ref 0–53)
AST: 15 U/L (ref 0–37)
Albumin: 4.9 g/dL (ref 3.5–5.2)
BILIRUBIN TOTAL: 0.6 mg/dL (ref 0.2–1.2)
BUN: 16 mg/dL (ref 6–23)
CO2: 30 mEq/L (ref 19–32)
CREATININE: 1.31 mg/dL (ref 0.40–1.50)
Calcium: 9.8 mg/dL (ref 8.4–10.5)
Chloride: 105 mEq/L (ref 96–112)
GFR: 58.92 mL/min — AB (ref >60.00)
Glucose, Bld: 94 mg/dL (ref 70–99)
Potassium: 4.2 mEq/L (ref 3.5–5.1)
SODIUM: 141 meq/L (ref 135–145)
TOTAL PROTEIN: 7.5 g/dL (ref 6.0–8.3)

## 2016-07-14 MED ORDER — MECLIZINE HCL 32 MG PO TABS: 32.0000 mg | ORAL_TABLET | Freq: Three times a day (TID) | ORAL | 0 refills | Status: DC | PRN

## 2016-07-14 NOTE — Progress Notes (Signed)
Subjective:    Patient ID: Joseph [name], male    DOB: [birth year], 61 y.o.   MRN: [id number]  Chief Complaint  Patient presents with  . Dizziness    5 days ago had a some dizziness and throwing up from it, still has some light dizziness from it    HPI:  Joseph [name] is a 61 y.o. male who  has a past medical history of Arthritis; GERD (gastroesophageal reflux disease); Heart murmur; and Rheumatic fever. and presents today for an office visit.  This is a new problem. Associated symptom of dizziness has been going on for about 5 days. Started initially when he got out of bed. Originally had symptoms of the room was spinning. Describes as things are running by him at a high rate a speed. Aggravated by looking around he feels a lightheadedness. Modifying factors include cleaning out his ears which did not help very much. No changes in medication. Indicates he tried to drink a lot of water which did not seem to make a significant difference. He does have a strange feeling in his neck with history of cervical surgeries.   Allergies  Allergen Reactions  . Other     Intolerance to narcotic medication - causes stomach upset and dizziness and does not relieve pain.       Outpatient Medications Prior to Visit  Medication Sig Dispense Refill  . lidocaine (LIDODERM) 5 % Place 1 patch onto the skin daily. Remove & Discard patch within 12 hours or as directed by MD 30 patch 0  . cyclobenzaprine (FLEXERIL) 10 MG tablet Take 1 tablet (10 mg total) by mouth 3 (three) times daily as needed for muscle spasms. 90 tablet 0  . gabapentin (NEURONTIN) 300 MG capsule TAKE 2 CAPSULES BY MOUTH 3 TIMES A DAY AS NEEDED 540 capsule 1  . meloxicam (MOBIC) 15 MG tablet TAKE 1 TABLET (15 MG TOTAL) BY MOUTH DAILY. 90 tablet 1  . omeprazole (PRILOSEC) 40 MG capsule TAKE 1 CAPSULE (40 MG TOTAL) BY MOUTH DAILY. 90 capsule 1   No facility-administered medications prior to visit.       Past Surgical History:    Procedure Laterality Date  . JOINT REPLACEMENT Right 2013  . SPINE SURGERY  1995   C4-5, C5-C6   . TONSILLECTOMY  1963      Past Medical History:  Diagnosis Date  . Arthritis   . GERD (gastroesophageal reflux disease)   . Heart murmur   . Rheumatic fever       Review of Systems  Constitutional: Negative for chills and fever.  HENT: Negative for congestion.   Respiratory: Negative for chest tightness and shortness of breath.   Cardiovascular: Negative for chest pain, palpitations and leg swelling.  Neurological: Positive for dizziness and light-headedness. Negative for weakness.      Objective:    BP (!) 140/94 (BP Location: Left Arm, Patient Position: Sitting, Cuff Size: Large)   Pulse 68   Temp 98.2 F (36.8 C) (Oral)   Resp 16   Ht 6\' 2"  (1.88 m)   Wt 206 lb 1.9 oz (93.5 kg)   SpO2 95%   BMI 26.46 kg/m  Nursing note and vital signs reviewed.  Physical Exam  Constitutional: He is oriented to person, place, and time. He appears well-developed and well-nourished. No distress.  Eyes: Conjunctivae and EOM are normal. Pupils are equal, round, and reactive to light.  Neck: Neck supple.  Cardiovascular: Normal rate, regular rhythm, normal heart sounds and  intact distal pulses.  Exam reveals no gallop and no friction rub.   No murmur heard. Pulmonary/Chest: Effort normal and breath sounds normal.  Lymphadenopathy:    He has no cervical adenopathy.  Neurological: He is alert and oriented to person, place, and time. No cranial nerve deficit.  Modified Dix-Hallpike with increase in symptoms but no spinning.   Skin: Skin is warm and dry.  Psychiatric: He has a normal mood and affect. His behavior is normal. Judgment and thought content normal.       Assessment & Plan:   Problem List Items Addressed This Visit      Other   Dizziness    Symptoms and exam consistent with possible benign positional vertigo. Obtain CBC with differential and comprehensive metabolic  panel. Neurological exam is normal. Unlikely intracranial pathology.  Encourage continue drink plenty of fluids. Start meclizine. If symptoms worsen or do not improve consider referral to physical therapy or possible imaging.      Relevant Medications   meclizine (ANTIVERT) 32 MG tablet   Other Relevant Orders   Comprehensive metabolic panel   CBC w/Diff       I have discontinued Joseph [name]'s cyclobenzaprine, omeprazole, meloxicam, and gabapentin. I am also having him start on meclizine. Additionally, I am having him maintain his lidocaine and pantoprazole.   Meds ordered this encounter  Medications  . pantoprazole (PROTONIX) 40 MG tablet    Sig: Take 40 mg by mouth daily.  . meclizine (ANTIVERT) 32 MG tablet    Sig: Take 1 tablet (32 mg total) by mouth 3 (three) times daily as needed.    Dispense:  30 tablet    Refill:  0    Order Specific Question:   Supervising Provider    Answer:   Pricilla Holm A [id number]     Follow-up: Return if symptoms worsen or fail to improve.  Mauricio Po, FNP

## 2016-07-14 NOTE — Assessment & Plan Note (Addendum)
Symptoms and exam consistent with possible benign positional vertigo. Obtain CBC with differential and comprehensive metabolic panel. Neurological exam is normal. Unlikely intracranial pathology.  Encourage continue drink plenty of fluids. Start meclizine. If symptoms worsen or do not improve consider referral to physical therapy or possible imaging.

## 2016-07-14 NOTE — Patient Instructions (Signed)
Thank you for choosing [facility].  SUMMARY AND INSTRUCTIONS:  Medication:  Your prescription(s) have been submitted to your pharmacy or been printed and provided for you. Please take as directed and contact our office if you believe you are having problem(s) with the medication(s) or have any questions.  Labs:  Please stop by the lab on the lower level of the building for your blood work. Your results will be released to [place] (or called to you) after review, usually within 72 hours after test completion. If any changes need to be made, you will be notified at that same time.  1.) The lab is open from 7:30am to 5:30 pm Monday-Friday 2.) No appointment is necessary 3.) Fasting (if needed) is 6-8 hours after food and drink; black coffee and water are okay   Follow up:  If your symptoms worsen or fail to improve, please contact our office for further instruction, or in case of emergency go directly to the emergency room at the closest medical facility.    Dizziness Dizziness is a common problem. It makes you feel unsteady or lightheaded. You may feel like you are about to pass out (faint). Dizziness can lead to injury if you stumble or fall. Anyone can get dizzy, but dizziness is more common in older adults. This condition can be caused by a number of things, including:  Medicines.  Dehydration.  Illness. Follow these instructions at home: Following these instructions may help with your condition: Eating and drinking  Drink enough fluid to keep your pee (urine) clear or pale yellow. This helps to keep you from getting dehydrated. Try to drink more clear fluids, such as water.  Do not drink alcohol.  Limit how much caffeine you drink or eat if told by your doctor.  Limit how much salt you drink or eat if told by your doctor. Activity  Avoid making quick movements.  When you stand up from sitting in a chair, steady yourself until you feel okay.  In the morning,  first sit up on the side of the bed. When you feel okay, stand slowly while you hold onto something. Do this until you know that your balance is fine.  Move your legs often if you need to stand in one place for a long time. Tighten and relax your muscles in your legs while you are standing.  Do not drive or use heavy machinery if you feel dizzy.  Avoid bending down if you feel dizzy. Place items in your home so that they are easy for you to reach without leaning over. Lifestyle  Do not use any tobacco products, including cigarettes, chewing tobacco, or electronic cigarettes. If you need help quitting, ask your doctor.  Try to lower your stress level, such as with yoga or meditation. Talk with your doctor if you need help. General instructions  Watch your dizziness for any changes.  Take medicines only as told by your doctor. Talk with your doctor if you think that your dizziness is caused by a medicine that you are taking.  Tell a friend or a family member that you are feeling dizzy. If he or she notices any changes in your behavior, have this person call your doctor.  Keep all follow-up visits as told by your doctor. This is important. Contact a doctor if:  Your dizziness does not go away.  Your dizziness or light-headedness gets worse.  You feel sick to your stomach (nauseous).  You have trouble hearing.  You have new  symptoms.  You are unsteady on your feet or you feel like the room is spinning. Get help right away if:  You throw up (vomit) or have diarrhea and are unable to eat or drink anything.  You have trouble:  Talking.  Walking.  Swallowing.  Using your arms, hands, or legs.  You feel generally weak.  You are not thinking clearly or you have trouble forming sentences. It may take a friend or family member to notice this.  You have:  Chest pain.  Pain in your belly (abdomen).  Shortness of breath.  Sweating.  Your vision changes.  You are  bleeding.  You have a headache.  You have neck pain or a stiff neck.  You have a fever. This information is not intended to replace advice given to you by your health care provider. Make sure you discuss any questions you have with your health care provider. Document Released: 05/20/2011 Document Revised: 11/06/2015 Document Reviewed: 05/27/2014 Elsevier Interactive Patient Education  2017 [employer].

## 2016-07-29 ENCOUNTER — Telehealth: Payer: Self-pay | Admitting: Family

## 2016-07-29 DIAGNOSIS — R42 Dizziness and giddiness: Secondary | ICD-10-CM

## 2016-07-29 NOTE — Telephone Encounter (Signed)
Please advise 

## 2016-07-29 NOTE — Telephone Encounter (Signed)
Patient states he was to call Marya Amsler if he had dizziness.  States that [facility] prescribed him medication for this but it is not helping.  States that PT might be the next step.  Please follow up in regard.

## 2016-07-29 NOTE — Telephone Encounter (Signed)
Referral to physical therapy placed.

## 2016-07-30 NOTE — Telephone Encounter (Signed)
Pt aware.

## 2016-08-03 ENCOUNTER — Ambulatory Visit: Payer: Medicare Other | Attending: Family | Admitting: Physical Therapy

## 2016-08-03 ENCOUNTER — Encounter: Payer: Self-pay | Admitting: Physical Therapy

## 2016-08-03 VITALS — BP 153/107

## 2016-08-03 DIAGNOSIS — R42 Dizziness and giddiness: Secondary | ICD-10-CM | POA: Diagnosis not present

## 2016-08-03 DIAGNOSIS — H8111 Benign paroxysmal vertigo, right ear: Secondary | ICD-10-CM

## 2016-08-03 NOTE — Therapy (Signed)
[facility] [address] [place], [place], [address] Phone: [phone]   Fax:  [phone]  Physical Therapy Evaluation  Patient Details  Name: Joseph [name] MRN: [id number] Date of Birth: August 08, 1955 Referring Provider: Dr. Terri Piedra  Encounter Date: 08/03/2016      PT End of Session - 08/03/16 1659    Visit Number 1   Number of Visits 4   Date for PT Re-Evaluation 09/02/16   Authorization Type Medicare   Authorization Time Period 08-03-16 - 10-02-16   PT Start Time 0850   PT Stop Time 0932   PT Time Calculation (min) 42 min      Past Medical History:  Diagnosis Date  . Arthritis   . GERD (gastroesophageal reflux disease)   . Heart murmur   . Rheumatic fever     Past Surgical History:  Procedure Laterality Date  . JOINT REPLACEMENT Right 2013  . SPINE SURGERY  1995   C4-5, C5-C6   . TONSILLECTOMY  1963    Vitals:   08/03/16 0851  BP: (!) 153/107         Subjective Assessment - 08/03/16 1637    Subjective Pt reports this episode started 2 weeks ago with him initially experiencing stumbling 4 weeks ago; pt got out of bed on morning of 07-07-16 and became extremely dizzy, had nausea and vomiting and total body sweating;  went to PCP a few days later;  pt also reports episode last Wednesday that started after he did some work in garage moving up and down alot while working on a go cart; states he feels much better today because he has been sitting alot these past few days                                                                                             Patient is accompained by: Family member   Pertinent History Cervical DDD, tinittus- bil. ears; s/p cervical fusion 1994; wife reports pt is in need of another fusion but pt declines due to cervical fusion "not going well"    Patient Stated Goals resolve the vertigo   Pain Type Acute pain   Pain Onset 1 to 4 weeks ago   Pain Frequency Constant             [facility] PT Assessment - 08/03/16 0856      Assessment   Medical Diagnosis Vertigo   Referring Provider Dr. Terri Piedra   Onset Date/Surgical Date 07/07/16     Precautions   Precautions None     Balance Screen   Has the patient fallen in the past 6 months No   Has the patient had a decrease in activity level because of a fear of falling?  No   Is the patient reluctant to leave their home because of a fear of falling?  No            Vestibular Assessment - 08/03/16 0906      Vestibular Assessment   General Observation Pt is a 61 year old male with c/o vertigo that started on 07-07-16 with nausea  and vomiting:  Pt reports he takes Meclizine prn, but did not take any yesterday.  Pt states he has been sitting around alot which has helped to alleviate the vertigo.     Symptom Behavior   Type of Dizziness Spinning   Frequency of Dizziness none within past few days   Duration of Dizziness minutes with exception of onset episode which lasted hours on 07-07-16   Aggravating Factors Activity in general   Relieving Factors Rest     Occulomotor Exam   Occulomotor Alignment Normal   Spontaneous Absent     Positional Testing   Dix-Hallpike Dix-Hallpike Right;Dix-Hallpike Left   Sidelying Test Sidelying Right;Sidelying Left     Dix-Hallpike Right   Dix-Hallpike Right Duration none   Dix-Hallpike Right Symptoms No nystagmus     Dix-Hallpike Left   Dix-Hallpike Left Duration none   Dix-Hallpike Left Symptoms No nystagmus     Sidelying Right   Sidelying Right Duration approx. 5 secs of pt reporting symptoms but denies spinning sensation in Rt sidelying   Sidelying Right Symptoms No nystagmus     Sidelying Left   Sidelying Left Duration none   Sidelying Left Symptoms No nystagmus                       PT Education - 08/03/16 1658    Education provided Yes   Education Details BPPV info; Laruth Bouchard- Daroff exercises prn   Person(s) Educated  Patient;Spouse   Methods Explanation;Handout   Comprehension Verbalized understanding;Returned demonstration             PT Long Term Goals - 08/03/16 1726      PT LONG TERM GOAL #1   Title Pt will report no vertigo with any bed mobility to indicate resolution of BPPV. (08-31-16)   Time 4   Period Weeks   Status New     PT LONG TERM GOAL #2   Title Pt will improve DHI score from 76% (severe) to </= 10% (mild) to improve functional mobility.  (08-31-16)   Baseline 76%   Time 4   Period Weeks   Status New     PT LONG TERM GOAL #3   Title Independent in HEP for habituation exercises prn.  (08-31-16)     PT LONG TERM GOAL #4   Title Pt will report ability to perform activities in garage requiring repetitive movements without vertigo.  (08-31-16)   Time 4   Period Weeks   Status New               Plan - 08/03/16 1701    Clinical Impression Statement Pt is a 61 year old male with c/o vertigo started on 07-07-16, but unable to provoke during today's initial PT evaluation.  No nystagmus observed with any positional testing, however, pt reports a feeling of pressure or tightness in his head.  Pt did have high BP reading of 153/107 - etiology of vertigo unknown at this time - unclear whether pt has BPPV with symptoms unable to be provoked or if symptoms due to HTN.  PMH includes s/p cervical fusion C4-5 and C5-6, arthritis, heart murmur and rheumatic fever.  Rehab Potential Good   PT Frequency 1x / week   PT Duration 4 weeks   PT Treatment/Interventions ADLs/Self Care Home Management;Canalith Repostioning;Gait training;Patient/family education;Neuromuscular re-education;Balance training;Therapeutic exercise;Therapeutic activities;Vestibular   PT Next Visit Plan re-assess vertigo - Rt BPPV?; DVA   PT Home Exercise Plan Brandt-Daroff    Consulted and  Agree with Plan of Care Patient      Patient will benefit from skilled therapeutic intervention in order to improve the following deficits and impairments:  Dizziness  Visit Diagnosis: BPPV (benign paroxysmal positional vertigo), right - Plan: PT plan of care cert/re-cert  Dizziness and giddiness - Plan: PT plan of care cert/re-cert      G-Codes - [id number]    Functional Assessment Tool Used (Outpatient Only) DHI score 76%; some vague symptoms reported during eval but minimal in intensity   Functional Limitation Changing and maintaining body position   Changing and Maintaining Body Position Current Status [id number]) At least 60 percent but less than 80 percent impaired, limited or restricted   Changing and Maintaining Body Position Goal Status [id number]) At least 1 percent but less than 20 percent impaired, limited or restricted       Problem List Patient Active Problem List   Diagnosis Date Noted  . Dizziness 07/14/2016  . Dysphagia 10/31/2015  . GERD (gastroesophageal reflux disease) 10/31/2015  . Neuropathy ([place]) 10/21/2015  . Essential hypertension 02/11/2015  . Shingles 07/10/2014  . Low back pain 05/01/2014  . Kidney stone 05/01/2014    Alda Lea, PT 08/03/2016, 5:40 PM  [facility] [address] [place], [place], [address] Phone: [phone]   Fax:  [phone]  Name: Steffon Tayman MRN: [id number] Date of Birth: Jan 08, 1956

## 2016-08-03 NOTE — Patient Instructions (Signed)
Sit to Side-Lying    Sit on edge of bed. 1. Turn head 45 to right. 2. Maintain head position and lie down slowly on left side. Hold until symptoms subside. 3. Sit up slowly. Hold until symptoms subside. 4. Turn head 45 to left. 5. Maintain head position and lie down slowly on right side. Hold until symptoms subside. 6. Sit up slowly. Repeat sequence __5__ times per session. Do __3__ sessions per day.  Copyright  VHI. All rights reserved.  Benign Positional Vertigo Introduction Vertigo is the feeling that you or your surroundings are moving when they are not. Benign positional vertigo is the most common form of vertigo. The cause of this condition is not serious (is benign). This condition is triggered by certain movements and positions (is positional). This condition can be dangerous if it occurs while you are doing something that could endanger you or others, such as driving. What are the causes? In many cases, the cause of this condition is not known. It may be caused by a disturbance in an area of the inner ear that helps your brain to sense movement and balance. This disturbance can be caused by a viral infection (labyrinthitis), head injury, or repetitive motion. What increases the risk? This condition is more likely to develop in:  Women.  People who are 38 years of age or older. What are the signs or symptoms? Symptoms of this condition usually happen when you move your head or your eyes in different directions. Symptoms may start suddenly, and they usually last for less than a minute. Symptoms may include:  Loss of balance and falling.  Feeling like you are spinning or moving.  Feeling like your surroundings are spinning or moving.  Nausea and vomiting.  Blurred vision.  Dizziness.  Involuntary eye movement (nystagmus). Symptoms can be mild and cause only slight annoyance, or they can be severe and interfere with daily life. Episodes of benign positional vertigo may  return (recur) over time, and they may be triggered by certain movements. Symptoms may improve over time. How is this diagnosed? This condition is usually diagnosed by medical history and a physical exam of the head, neck, and ears. You may be referred to a health care provider who specializes in ear, nose, and throat (ENT) problems (otolaryngologist) or a provider who specializes in disorders of the nervous system (neurologist). You may have additional testing, including:  MRI.  A CT scan.  Eye movement tests. Your health care provider may ask you to change positions quickly while he or she watches you for symptoms of benign positional vertigo, such as nystagmus. Eye movement may be tested with an electronystagmogram (ENG), caloric stimulation, the Dix-Hallpike test, or the roll test.  An electroencephalogram (EEG). This records electrical activity in your brain.  Hearing tests. How is this treated? Usually, your health care provider will treat this by moving your head in specific positions to adjust your inner ear back to normal. Surgery may be needed in severe cases, but this is rare. In some cases, benign positional vertigo may resolve on its own in 2-4 weeks. Follow these instructions at home: Safety  Move slowly.Avoid sudden body or head movements.  Avoid driving.  Avoid operating heavy machinery.  Avoid doing any tasks that would be dangerous to you or others if a vertigo episode would occur.  If you have trouble walking or keeping your balance, try using a cane for stability. If you feel dizzy or unstable, sit down right away.  Return  to your normal activities as told by your health care provider. Ask your health care provider what activities are safe for you. General instructions  Take over-the-counter and prescription medicines only as told by your health care provider.  Avoid certain positions or movements as told by your health care provider.  Drink enough fluid to keep  your urine clear or pale yellow.  Keep all follow-up visits as told by your health care provider. This is important. Contact a health care provider if:  You have a fever.  Your condition gets worse or you develop new symptoms.  Your family or friends notice any behavioral changes.  Your nausea or vomiting gets worse.  You have numbness or a "pins and needles" sensation. Get help right away if:  You have difficulty speaking or moving.  You are always dizzy.  You faint.  You develop severe headaches.  You have weakness in your legs or arms.  You have changes in your hearing or vision.  You develop a stiff neck.  You develop sensitivity to light. This information is not intended to replace advice given to you by your health care provider. Make sure you discuss any questions you have with your health care provider. Document Released: 03/08/2006 Document Revised: 11/06/2015 Document Reviewed: 09/23/2014  2017 Elsevier

## 2016-08-16 ENCOUNTER — Encounter: Payer: Medicare Other | Admitting: Physical Therapy

## 2016-12-22 DIAGNOSIS — Z5181 Encounter for therapeutic drug level monitoring: Secondary | ICD-10-CM | POA: Diagnosis not present

## 2016-12-22 DIAGNOSIS — K209 Esophagitis, unspecified: Secondary | ICD-10-CM | POA: Diagnosis not present

## 2016-12-22 DIAGNOSIS — R1310 Dysphagia, unspecified: Secondary | ICD-10-CM | POA: Diagnosis not present

## 2016-12-22 DIAGNOSIS — Z79899 Other long term (current) drug therapy: Secondary | ICD-10-CM | POA: Diagnosis not present

## 2016-12-22 DIAGNOSIS — K219 Gastro-esophageal reflux disease without esophagitis: Secondary | ICD-10-CM | POA: Diagnosis not present

## 2017-05-16 ENCOUNTER — Other Ambulatory Visit: Payer: Self-pay | Admitting: Family

## 2017-05-17 DIAGNOSIS — M9903 Segmental and somatic dysfunction of lumbar region: Secondary | ICD-10-CM | POA: Diagnosis not present

## 2017-05-17 DIAGNOSIS — M9904 Segmental and somatic dysfunction of sacral region: Secondary | ICD-10-CM | POA: Diagnosis not present

## 2017-05-17 DIAGNOSIS — M9901 Segmental and somatic dysfunction of cervical region: Secondary | ICD-10-CM | POA: Diagnosis not present

## 2017-05-17 DIAGNOSIS — M9905 Segmental and somatic dysfunction of pelvic region: Secondary | ICD-10-CM | POA: Diagnosis not present

## 2017-05-20 DIAGNOSIS — M9903 Segmental and somatic dysfunction of lumbar region: Secondary | ICD-10-CM | POA: Diagnosis not present

## 2017-05-20 DIAGNOSIS — M9901 Segmental and somatic dysfunction of cervical region: Secondary | ICD-10-CM | POA: Diagnosis not present

## 2017-05-20 DIAGNOSIS — M9905 Segmental and somatic dysfunction of pelvic region: Secondary | ICD-10-CM | POA: Diagnosis not present

## 2017-05-20 DIAGNOSIS — M9904 Segmental and somatic dysfunction of sacral region: Secondary | ICD-10-CM | POA: Diagnosis not present

## 2017-05-27 DIAGNOSIS — M9904 Segmental and somatic dysfunction of sacral region: Secondary | ICD-10-CM | POA: Diagnosis not present

## 2017-05-27 DIAGNOSIS — M9901 Segmental and somatic dysfunction of cervical region: Secondary | ICD-10-CM | POA: Diagnosis not present

## 2017-05-27 DIAGNOSIS — M9903 Segmental and somatic dysfunction of lumbar region: Secondary | ICD-10-CM | POA: Diagnosis not present

## 2017-05-27 DIAGNOSIS — M9905 Segmental and somatic dysfunction of pelvic region: Secondary | ICD-10-CM | POA: Diagnosis not present

## 2017-05-31 DIAGNOSIS — M9901 Segmental and somatic dysfunction of cervical region: Secondary | ICD-10-CM | POA: Diagnosis not present

## 2017-05-31 DIAGNOSIS — M9903 Segmental and somatic dysfunction of lumbar region: Secondary | ICD-10-CM | POA: Diagnosis not present

## 2017-05-31 DIAGNOSIS — M9905 Segmental and somatic dysfunction of pelvic region: Secondary | ICD-10-CM | POA: Diagnosis not present

## 2017-05-31 DIAGNOSIS — M9904 Segmental and somatic dysfunction of sacral region: Secondary | ICD-10-CM | POA: Diagnosis not present

## 2017-06-08 DIAGNOSIS — M9905 Segmental and somatic dysfunction of pelvic region: Secondary | ICD-10-CM | POA: Diagnosis not present

## 2017-06-08 DIAGNOSIS — M9904 Segmental and somatic dysfunction of sacral region: Secondary | ICD-10-CM | POA: Diagnosis not present

## 2017-06-08 DIAGNOSIS — M9903 Segmental and somatic dysfunction of lumbar region: Secondary | ICD-10-CM | POA: Diagnosis not present

## 2017-06-08 DIAGNOSIS — M9901 Segmental and somatic dysfunction of cervical region: Secondary | ICD-10-CM | POA: Diagnosis not present

## 2017-06-21 DIAGNOSIS — M9903 Segmental and somatic dysfunction of lumbar region: Secondary | ICD-10-CM | POA: Diagnosis not present

## 2017-06-21 DIAGNOSIS — M9904 Segmental and somatic dysfunction of sacral region: Secondary | ICD-10-CM | POA: Diagnosis not present

## 2017-06-21 DIAGNOSIS — M9901 Segmental and somatic dysfunction of cervical region: Secondary | ICD-10-CM | POA: Diagnosis not present

## 2017-06-21 DIAGNOSIS — M9905 Segmental and somatic dysfunction of pelvic region: Secondary | ICD-10-CM | POA: Diagnosis not present

## 2017-06-24 DIAGNOSIS — M9902 Segmental and somatic dysfunction of thoracic region: Secondary | ICD-10-CM | POA: Diagnosis not present

## 2017-06-24 DIAGNOSIS — M9904 Segmental and somatic dysfunction of sacral region: Secondary | ICD-10-CM | POA: Diagnosis not present

## 2017-06-24 DIAGNOSIS — M9905 Segmental and somatic dysfunction of pelvic region: Secondary | ICD-10-CM | POA: Diagnosis not present

## 2017-06-24 DIAGNOSIS — M9903 Segmental and somatic dysfunction of lumbar region: Secondary | ICD-10-CM | POA: Diagnosis not present

## 2017-06-29 DIAGNOSIS — M9902 Segmental and somatic dysfunction of thoracic region: Secondary | ICD-10-CM | POA: Diagnosis not present

## 2017-06-29 DIAGNOSIS — M9903 Segmental and somatic dysfunction of lumbar region: Secondary | ICD-10-CM | POA: Diagnosis not present

## 2017-06-29 DIAGNOSIS — M9905 Segmental and somatic dysfunction of pelvic region: Secondary | ICD-10-CM | POA: Diagnosis not present

## 2017-06-29 DIAGNOSIS — M9904 Segmental and somatic dysfunction of sacral region: Secondary | ICD-10-CM | POA: Diagnosis not present

## 2017-07-04 DIAGNOSIS — M9903 Segmental and somatic dysfunction of lumbar region: Secondary | ICD-10-CM | POA: Diagnosis not present

## 2017-07-04 DIAGNOSIS — M9904 Segmental and somatic dysfunction of sacral region: Secondary | ICD-10-CM | POA: Diagnosis not present

## 2017-07-04 DIAGNOSIS — M9905 Segmental and somatic dysfunction of pelvic region: Secondary | ICD-10-CM | POA: Diagnosis not present

## 2017-07-04 DIAGNOSIS — M9902 Segmental and somatic dysfunction of thoracic region: Secondary | ICD-10-CM | POA: Diagnosis not present

## 2017-07-08 DIAGNOSIS — M9902 Segmental and somatic dysfunction of thoracic region: Secondary | ICD-10-CM | POA: Diagnosis not present

## 2017-07-08 DIAGNOSIS — M9904 Segmental and somatic dysfunction of sacral region: Secondary | ICD-10-CM | POA: Diagnosis not present

## 2017-07-08 DIAGNOSIS — M9903 Segmental and somatic dysfunction of lumbar region: Secondary | ICD-10-CM | POA: Diagnosis not present

## 2017-07-08 DIAGNOSIS — M9905 Segmental and somatic dysfunction of pelvic region: Secondary | ICD-10-CM | POA: Diagnosis not present

## 2017-07-15 DIAGNOSIS — M9904 Segmental and somatic dysfunction of sacral region: Secondary | ICD-10-CM | POA: Diagnosis not present

## 2017-07-15 DIAGNOSIS — M9902 Segmental and somatic dysfunction of thoracic region: Secondary | ICD-10-CM | POA: Diagnosis not present

## 2017-07-15 DIAGNOSIS — M9905 Segmental and somatic dysfunction of pelvic region: Secondary | ICD-10-CM | POA: Diagnosis not present

## 2017-07-15 DIAGNOSIS — M9903 Segmental and somatic dysfunction of lumbar region: Secondary | ICD-10-CM | POA: Diagnosis not present

## 2017-08-02 DIAGNOSIS — M9905 Segmental and somatic dysfunction of pelvic region: Secondary | ICD-10-CM | POA: Diagnosis not present

## 2017-08-02 DIAGNOSIS — M9904 Segmental and somatic dysfunction of sacral region: Secondary | ICD-10-CM | POA: Diagnosis not present

## 2017-08-02 DIAGNOSIS — M9903 Segmental and somatic dysfunction of lumbar region: Secondary | ICD-10-CM | POA: Diagnosis not present

## 2017-08-02 DIAGNOSIS — M9902 Segmental and somatic dysfunction of thoracic region: Secondary | ICD-10-CM | POA: Diagnosis not present

## 2017-08-09 DIAGNOSIS — M9903 Segmental and somatic dysfunction of lumbar region: Secondary | ICD-10-CM | POA: Diagnosis not present

## 2017-08-09 DIAGNOSIS — M9904 Segmental and somatic dysfunction of sacral region: Secondary | ICD-10-CM | POA: Diagnosis not present

## 2017-08-09 DIAGNOSIS — M9905 Segmental and somatic dysfunction of pelvic region: Secondary | ICD-10-CM | POA: Diagnosis not present

## 2017-08-09 DIAGNOSIS — M9902 Segmental and somatic dysfunction of thoracic region: Secondary | ICD-10-CM | POA: Diagnosis not present

## 2017-08-17 DIAGNOSIS — M9904 Segmental and somatic dysfunction of sacral region: Secondary | ICD-10-CM | POA: Diagnosis not present

## 2017-08-17 DIAGNOSIS — M9902 Segmental and somatic dysfunction of thoracic region: Secondary | ICD-10-CM | POA: Diagnosis not present

## 2017-08-17 DIAGNOSIS — M9903 Segmental and somatic dysfunction of lumbar region: Secondary | ICD-10-CM | POA: Diagnosis not present

## 2017-08-17 DIAGNOSIS — M9905 Segmental and somatic dysfunction of pelvic region: Secondary | ICD-10-CM | POA: Diagnosis not present

## 2017-09-12 ENCOUNTER — Ambulatory Visit (INDEPENDENT_AMBULATORY_CARE_PROVIDER_SITE_OTHER): Payer: Medicare Other | Admitting: Family Medicine

## 2017-09-12 ENCOUNTER — Encounter: Payer: Self-pay | Admitting: Family Medicine

## 2017-09-12 VITALS — BP 130/82 | HR 68 | Temp 98.3°F | Ht 75.0 in | Wt 212.4 lb

## 2017-09-12 DIAGNOSIS — Z0000 Encounter for general adult medical examination without abnormal findings: Secondary | ICD-10-CM | POA: Diagnosis not present

## 2017-09-12 DIAGNOSIS — E663 Overweight: Secondary | ICD-10-CM

## 2017-09-12 DIAGNOSIS — Z87891 Personal history of nicotine dependence: Secondary | ICD-10-CM | POA: Diagnosis not present

## 2017-09-12 DIAGNOSIS — Z125 Encounter for screening for malignant neoplasm of prostate: Secondary | ICD-10-CM | POA: Diagnosis not present

## 2017-09-12 DIAGNOSIS — E785 Hyperlipidemia, unspecified: Secondary | ICD-10-CM

## 2017-09-12 DIAGNOSIS — K219 Gastro-esophageal reflux disease without esophagitis: Secondary | ICD-10-CM | POA: Diagnosis not present

## 2017-09-12 DIAGNOSIS — Z114 Encounter for screening for human immunodeficiency virus [HIV]: Secondary | ICD-10-CM | POA: Diagnosis not present

## 2017-09-12 DIAGNOSIS — I10 Essential (primary) hypertension: Secondary | ICD-10-CM | POA: Diagnosis not present

## 2017-09-12 DIAGNOSIS — Z1159 Encounter for screening for other viral diseases: Secondary | ICD-10-CM | POA: Diagnosis not present

## 2017-09-12 LAB — COMPREHENSIVE METABOLIC PANEL
ALBUMIN: 4.3 g/dL (ref 3.5–5.2)
ALT: 15 U/L (ref 0–53)
AST: 17 U/L (ref 0–37)
Alkaline Phosphatase: 69 U/L (ref 39–117)
BILIRUBIN TOTAL: 0.7 mg/dL (ref 0.2–1.2)
BUN: 17 mg/dL (ref 6–23)
CALCIUM: 9.6 mg/dL (ref 8.4–10.5)
CHLORIDE: 105 meq/L (ref 96–112)
CO2: 30 meq/L (ref 19–32)
Creatinine, Ser: 1.27 mg/dL (ref 0.40–1.50)
GFR: 60.84 mL/min (ref >60.00)
Glucose, Bld: 105 mg/dL — ABNORMAL HIGH (ref 70–99)
Potassium: 4.2 mEq/L (ref 3.5–5.1)
Sodium: 142 mEq/L (ref 135–145)
Total Protein: 7 g/dL (ref 6.0–8.3)

## 2017-09-12 LAB — LIPID PANEL
CHOL/HDL RATIO: 5
CHOLESTEROL: 201 mg/dL — AB (ref 0–200)
HDL: 41.5 mg/dL (ref >39.00)
LDL CALC: 131 mg/dL — AB (ref 0–99)
NonHDL: 159.87
Triglycerides: 145 mg/dL (ref 0.0–149.0)
VLDL: 29 mg/dL (ref 0.0–40.0)

## 2017-09-12 LAB — PSA, MEDICARE: PSA: 1.61 ng/ml (ref 0.10–4.00)

## 2017-09-12 MED ORDER — PANTOPRAZOLE SODIUM 40 MG PO TBEC: 40.0000 mg | DELAYED_RELEASE_TABLET | Freq: Two times a day (BID) | ORAL | 3 refills | Status: DC

## 2017-09-12 NOTE — Patient Instructions (Signed)
Keep the diet clean.  Stay active.  Give [place] 2-3 business days to get the results of your labs back. If labs are normal, you will likely receive a letter in the mail unless you have MyChart. This can take longer than 2-3 business days.   Let us know if you need anything.

## 2017-09-12 NOTE — Progress Notes (Addendum)
Subjective:    Joseph [name] is a 62 y.o. male who presents for Medicare Initial preventive examination.   Preventive Screening-Counseling & Management  Tobacco Social History   Tobacco Use  Smoking Status Former Smoker  . Packs/day: 1.50  . Years: 20.00  . Pack years: 30.00  . Types: Cigarettes  . Last attempt to quit: 05/01/1990  . Years since quitting: 27.3  Smokeless Tobacco Never Used    Problems Prior to Visit 1. GERD/Dysphagia 2. Osteoarthritis  Current Problems (verified) Patient Active Problem List   Diagnosis Date Noted  . Hyperlipidemia 09/14/2017  . History of tobacco abuse 09/14/2017  . Overweight (BMI 25.0-29.9) 09/14/2017  . Dizziness 07/14/2016  . Dysphagia 10/31/2015  . GERD (gastroesophageal reflux disease) 10/31/2015  . Neuropathy 10/21/2015  . Essential hypertension 02/11/2015  . Shingles 07/10/2014  . Low back pain 05/01/2014  . Kidney stone 05/01/2014    Medications Prior to Visit Current Outpatient Medications on File Prior to Visit  Medication Sig Dispense Refill  . lidocaine (LIDODERM) 5 % Place 1 patch onto the skin daily. Remove & Discard patch within 12 hours or as directed by MD 30 patch 0    Current Medications (verified) Current Outpatient Medications  Medication Sig Dispense Refill  . lidocaine (LIDODERM) 5 % Place 1 patch onto the skin daily. Remove & Discard patch within 12 hours or as directed by MD 30 patch 0  . pantoprazole (PROTONIX) 40 MG tablet Take 1 tablet (40 mg total) by mouth 2 (two) times daily. 180 tablet 3    Allergies (verified) Other - Intolerance to narcotics  PAST HISTORY  Family History Family History  Problem Relation Age of Onset  . Arthritis Mother        Rheumatoid  . Diabetes Mother   . Arthritis Father   . Hypertension Father   . Diabetes Brother   . Lupus Brother     Social History Social History   Tobacco Use  . Smoking status: Former Smoker    Packs/day: 1.50    Years: 20.00     Pack years: 30.00    Types: Cigarettes    Last attempt to quit: 05/01/1990    Years since quitting: 27.3  . Smokeless tobacco: Never Used  Substance Use Topics  . Alcohol use: No    Alcohol/week: 0.0 oz    Are there smokers in your home (other than you)?  No  Risk Factors Current exercise habits: physically active at home  Dietary issues discussed: Yes, doing welll overall   Cardiac risk factors: advanced age (older than 61 for men, 32 for women) and male gender.  Depression Screen (Note: if answer to either of the following is "Yes", a more complete depression screening is indicated)   Q1: Over the past two weeks, have you felt down, depressed or hopeless? No  Q2: Over the past two weeks, have you felt little interest or pleasure in doing things? No  Have you lost interest or pleasure in daily life? No  Do you often feel hopeless? No  Do you cry easily over simple problems? No  Activities of Daily Living In your present state of health, do you have any difficulty performing the following activities?:  Driving? No Managing money?  No Feeding yourself? No Getting from bed to chair? No Climbing a flight of stairs? No Preparing food and eating?: No Bathing or showering? No Getting dressed: No Getting to the toilet? No Using the toilet:No Moving around from place to place: No  In the past year have you fallen or had a near fall?:No  Are you sexually active?  Yes  Do you have more than one partner?  No  Hearing Difficulties: No Do you often ask people to speak up or repeat themselves? No Do you experience ringing or noises in your ears? No Do you have difficulty understanding soft or whispered voices? No   Do you feel that you have a problem with memory? No  Do you often misplace items? No  Do you feel safe at home?  Yes  Cognitive Testing  Alert? Yes  Normal Appearance?Yes  Oriented to person? Yes  Place? Yes   Time? Yes  Displays appropriate judgment?Yes  Advanced  Directives have been discussed with the patient? Yes   List the Names of Other Physician/Practitioners you currently use: 1.    Indicate any recent Medical Services you may have received from other than [facility] providers in the past year (date may be approximate).  Immunization History  Administered Date(s) Administered  . Td 09/13/2015    Screening Tests Health Maintenance  Topic Date Due  . INFLUENZA VACCINE  01/12/2018  . TETANUS/TDAP  09/12/2025  . COLONOSCOPY  11/24/2025  . Hepatitis C Screening  Completed  . HIV Screening  Completed    All answers were reviewed with the patient and necessary referrals were made:  [name], DO   09/14/2017   History reviewed: allergies, current medications, past family history, past medical history, past social history, past surgical history and problem list  Review of Systems +joint pain 2/2 OA 10 pt review of systems neg otherwise    Objective:     Blood pressure 130/82, pulse 68, temperature 98.3 F (36.8 C), temperature source Oral, height 6\' 3"  (1.905 m), weight 212 lb 6 oz (96.3 kg), SpO2 93 %. Body mass index is 26.55 kg/m.  BP 130/82 (BP Location: Left Arm, Patient Position: Sitting, Cuff Size: Large)   Pulse 68   Temp 98.3 F (36.8 C) (Oral)   Ht 6\' 3"  (1.905 m)   Wt 212 lb 6 oz (96.3 kg)   SpO2 93%   BMI 26.55 kg/m   General Appearance:    Alert, cooperative, no distress, appears stated age  Head:    Normocephalic, without obvious abnormality, atraumatic  Eyes:    PERRL, conjunctiva/corneas clear, EOM's intact, fundi    benign, both eyes       Ears:    Normal TM's and external ear canals, both ears  Nose:   Nares normal, septum midline, mucosa normal, no drainage    or sinus tenderness  Throat:   Lips, mucosa, and tongue normal; teeth and gums normal  Neck:   Supple, symmetrical, trachea midline, no adenopathy;       thyroid:  No enlargement/tenderness/nodules  Lungs:     Clear to auscultation  bilaterally, respirations unlabored  Chest wall:    No tenderness or deformity  Heart:    Regular rate and rhythm, S1 and S2 normal, no bruits  Abdomen:     Soft, non-tender, bowel sounds active all four quadrants,    no masses, no organomegaly  Extremities:   Extremities normal, atraumatic, no cyanosis or edema  Skin:   Skin color, texture, turgor normal, no rashes or lesions  Neurologic:   CNII-XII intact. Normal strength, sensation and reflexes      throughout       Assessment:   Welcome to Medicare preventive visit - Plan: Comprehensive metabolic panel, Lipid  panel  Gastroesophageal reflux disease, esophagitis presence not specified  Screening for HIV (human immunodeficiency virus) - Plan: HIV antibody  Screening for prostate cancer - Plan: PSA, Medicare  Encounter for hepatitis C screening test for low risk patient - Plan: Hepatitis C antibody  Overweight (BMI 25.0-29.9)  Essential hypertension  History of tobacco abuse  Hyperlipidemia, unspecified hyperlipidemia type    Plan:     During the course of the visit the patient was educated and counseled about appropriate screening and preventive services including:    Prostate cancer screening- risks and benefits of this discussed, he agreed to undergo testing  Colorectal cancer screening  Nutrition counseling   Hep C/HIV screening  Diet review for nutrition referral? Not indicated   Patient Instructions (the written plan) was given to the patient.  Medicare Attestation I have personally reviewed: The patient's medical and social history Their use of alcohol, tobacco or illicit drugs Their current medications and supplements The patient's functional ability including ADLs, fall risks, home safety risks, cognitive, and hearing and visual impairment Diet and physical activities Evidence for depression or mood disorders  The patient's weight, height, BMI, and visual acuity have been recorded in the chart.  I  have made referrals, counseling, and provided education to the patient based on review of the above and I have provided the patient with a written personalized care plan for preventive services.     [place], DO   09/14/2017

## 2017-09-12 NOTE — Progress Notes (Signed)
Pre visit review using our clinic review tool, if applicable. No additional management support is needed unless otherwise documented below in the visit note. 

## 2017-09-13 LAB — HEPATITIS C ANTIBODY
Hepatitis C Ab: NONREACTIVE
SIGNAL TO CUT-OFF: 0.02 (ref <1.00)

## 2017-09-13 LAB — HIV ANTIBODY (ROUTINE TESTING W REFLEX): HIV 1&2 Ab, 4th Generation: NONREACTIVE

## 2017-09-14 ENCOUNTER — Other Ambulatory Visit: Payer: Self-pay | Admitting: Family Medicine

## 2017-09-14 DIAGNOSIS — Z87891 Personal history of nicotine dependence: Secondary | ICD-10-CM | POA: Insufficient documentation

## 2017-09-14 DIAGNOSIS — E663 Overweight: Secondary | ICD-10-CM | POA: Insufficient documentation

## 2017-09-14 DIAGNOSIS — R739 Hyperglycemia, unspecified: Secondary | ICD-10-CM

## 2017-09-14 DIAGNOSIS — E785 Hyperlipidemia, unspecified: Secondary | ICD-10-CM | POA: Insufficient documentation

## 2017-09-14 DIAGNOSIS — E782 Mixed hyperlipidemia: Secondary | ICD-10-CM

## 2017-09-26 DIAGNOSIS — M9902 Segmental and somatic dysfunction of thoracic region: Secondary | ICD-10-CM | POA: Diagnosis not present

## 2017-09-26 DIAGNOSIS — M9905 Segmental and somatic dysfunction of pelvic region: Secondary | ICD-10-CM | POA: Diagnosis not present

## 2017-09-26 DIAGNOSIS — M9903 Segmental and somatic dysfunction of lumbar region: Secondary | ICD-10-CM | POA: Diagnosis not present

## 2017-09-26 DIAGNOSIS — M9904 Segmental and somatic dysfunction of sacral region: Secondary | ICD-10-CM | POA: Diagnosis not present

## 2017-10-03 DIAGNOSIS — M9903 Segmental and somatic dysfunction of lumbar region: Secondary | ICD-10-CM | POA: Diagnosis not present

## 2017-10-03 DIAGNOSIS — M9902 Segmental and somatic dysfunction of thoracic region: Secondary | ICD-10-CM | POA: Diagnosis not present

## 2017-10-03 DIAGNOSIS — M9904 Segmental and somatic dysfunction of sacral region: Secondary | ICD-10-CM | POA: Diagnosis not present

## 2017-10-03 DIAGNOSIS — M9905 Segmental and somatic dysfunction of pelvic region: Secondary | ICD-10-CM | POA: Diagnosis not present

## 2017-10-04 DIAGNOSIS — M9903 Segmental and somatic dysfunction of lumbar region: Secondary | ICD-10-CM | POA: Diagnosis not present

## 2017-10-04 DIAGNOSIS — M9904 Segmental and somatic dysfunction of sacral region: Secondary | ICD-10-CM | POA: Diagnosis not present

## 2017-10-04 DIAGNOSIS — M9905 Segmental and somatic dysfunction of pelvic region: Secondary | ICD-10-CM | POA: Diagnosis not present

## 2017-10-04 DIAGNOSIS — M9902 Segmental and somatic dysfunction of thoracic region: Secondary | ICD-10-CM | POA: Diagnosis not present

## 2017-10-06 DIAGNOSIS — M47812 Spondylosis without myelopathy or radiculopathy, cervical region: Secondary | ICD-10-CM | POA: Diagnosis not present

## 2017-10-06 DIAGNOSIS — M19011 Primary osteoarthritis, right shoulder: Secondary | ICD-10-CM | POA: Diagnosis not present

## 2017-10-10 ENCOUNTER — Telehealth: Payer: Self-pay | Admitting: Family Medicine

## 2017-10-10 NOTE — Telephone Encounter (Signed)
I am not sure what this is referencing. It was a blank message when it was routed to me. TY.

## 2017-10-10 NOTE — Telephone Encounter (Signed)
l °

## 2017-10-21 ENCOUNTER — Other Ambulatory Visit: Payer: Self-pay | Admitting: Family Medicine

## 2017-10-21 DIAGNOSIS — R42 Dizziness and giddiness: Secondary | ICD-10-CM

## 2017-10-21 NOTE — Telephone Encounter (Signed)
Referral done

## 2017-10-21 NOTE — Telephone Encounter (Signed)
He is needing asap as nose has pressure and is getting dizzy just when blowing his nose. And has vertigo

## 2017-10-21 NOTE — Telephone Encounter (Signed)
OK. For future reference, an appointment would hasten this process. TY.

## 2017-10-21 NOTE — Telephone Encounter (Signed)
Pt needing referrral from Dr. Nani Ravens for an appt.    [facility] ENT w/ Dr. Wilburn Cornelia - having issues w/ his ear - has has this ongoing issue w/ dizziness - vertigo

## 2017-11-01 DIAGNOSIS — R42 Dizziness and giddiness: Secondary | ICD-10-CM | POA: Diagnosis not present

## 2017-11-01 DIAGNOSIS — H903 Sensorineural hearing loss, bilateral: Secondary | ICD-10-CM | POA: Insufficient documentation

## 2017-11-01 DIAGNOSIS — H9311 Tinnitus, right ear: Secondary | ICD-10-CM | POA: Insufficient documentation

## 2017-11-23 DIAGNOSIS — Z96611 Presence of right artificial shoulder joint: Secondary | ICD-10-CM | POA: Diagnosis not present

## 2017-11-23 DIAGNOSIS — M25511 Pain in right shoulder: Secondary | ICD-10-CM | POA: Diagnosis not present

## 2018-02-27 ENCOUNTER — Telehealth: Payer: Self-pay | Admitting: Family Medicine

## 2018-02-27 NOTE — Telephone Encounter (Signed)
Copied from [place] [id number]. Topic: Referral - Request >> Feb 27, 2018  9:37 AM Judyann Munson wrote: Reason for CRM:  Patient is calling to request a referral for a dermatologist in regards to back of shoulder ( left). Please advise

## 2018-02-27 NOTE — Telephone Encounter (Signed)
Needs appt here first. Derm will not accept my referrals without an office note. TY.

## 2018-02-27 NOTE — Telephone Encounter (Signed)
Called informed the patient of instructions. Scheduled appt this coming up Wednesday to discuss referral to derm

## 2018-03-01 ENCOUNTER — Encounter: Payer: Self-pay | Admitting: Family Medicine

## 2018-03-01 ENCOUNTER — Ambulatory Visit (INDEPENDENT_AMBULATORY_CARE_PROVIDER_SITE_OTHER): Payer: Medicare Other | Admitting: Family Medicine

## 2018-03-01 ENCOUNTER — Telehealth: Payer: Self-pay | Admitting: Family Medicine

## 2018-03-01 VITALS — BP 120/80 | HR 61 | Temp 98.0°F | Ht 75.0 in | Wt 211.5 lb

## 2018-03-01 DIAGNOSIS — L989 Disorder of the skin and subcutaneous tissue, unspecified: Secondary | ICD-10-CM

## 2018-03-01 DIAGNOSIS — M545 Low back pain, unspecified: Secondary | ICD-10-CM

## 2018-03-01 DIAGNOSIS — L820 Inflamed seborrheic keratosis: Secondary | ICD-10-CM | POA: Diagnosis not present

## 2018-03-01 DIAGNOSIS — M1990 Unspecified osteoarthritis, unspecified site: Secondary | ICD-10-CM

## 2018-03-01 DIAGNOSIS — D489 Neoplasm of uncertain behavior, unspecified: Secondary | ICD-10-CM

## 2018-03-01 DIAGNOSIS — R739 Hyperglycemia, unspecified: Secondary | ICD-10-CM | POA: Diagnosis not present

## 2018-03-01 DIAGNOSIS — N529 Male erectile dysfunction, unspecified: Secondary | ICD-10-CM

## 2018-03-01 DIAGNOSIS — E782 Mixed hyperlipidemia: Secondary | ICD-10-CM | POA: Diagnosis not present

## 2018-03-01 LAB — LDL CHOLESTEROL, DIRECT: LDL DIRECT: 151 mg/dL

## 2018-03-01 LAB — LIPID PANEL
CHOLESTEROL: 216 mg/dL — AB (ref 0–200)
HDL: 43.8 mg/dL (ref >39.00)
NonHDL: 172.27
Total CHOL/HDL Ratio: 5
Triglycerides: 207 mg/dL — ABNORMAL HIGH (ref 0.0–149.0)
VLDL: 41.4 mg/dL — AB (ref 0.0–40.0)

## 2018-03-01 LAB — HEMOGLOBIN A1C: HEMOGLOBIN A1C: 5.7 % (ref 4.6–6.5)

## 2018-03-01 LAB — TESTOSTERONE: Testosterone: 279.42 ng/dL — ABNORMAL LOW (ref 300.00–890.00)

## 2018-03-01 MED ORDER — DICLOFENAC SODIUM 2 % TD SOLN: TRANSDERMAL | 1 refills | Status: DC

## 2018-03-01 NOTE — Progress Notes (Signed)
Chief Complaint  Patient presents with  . Referral    dermatology    Joseph [name] is a 62 y.o. male here for a skin complaint.  2 lesions Duration: several weeks Location: back-upper left and left mid respectively Pruritic? No Painful? No Drainage? No New soaps/lotions/topicals/detergents? No The one on the upper back is been there for several weeks and increasingly growing in size and recently became crusty/scaly.  He does have a history of actinic keratosis. There is also another lesion that he is unsure whether or not it is changing on his back.  It is dark.  Patient has a history of arthritis.  He has a coworker that uses pen said topical anti-inflammatory cream.  He is interested in this prescription if possible.  Patient has been having erectile dysfunction issues.  His libido is still present though.  He would like his testosterone checked.  ROS:  Const: No fevers Skin: As noted in HPI  Past Medical History:  Diagnosis Date  . Arthritis   . GERD (gastroesophageal reflux disease)   . Heart murmur   . Rheumatic fever    Allergies  Allergen Reactions  . Other     Intolerance to narcotic medication - causes stomach upset and dizziness and does not relieve pain.      BP 120/80 (BP Location: Left Arm, Patient Position: Sitting, Cuff Size: Large)   Pulse 61   Temp 98 F (36.7 C) (Oral)   Ht 6\' 3"  (1.905 m)   Wt 95.9 kg   SpO2 97%   BMI 26.44 kg/m  Gen: awake, alert, appearing stated age Lungs: No accessory muscle use Skin: On the left upper back, there is a 0.5 x 0.3 cm lesion that is scaly and mildly hyperemic and raised; close to the center of the back on the left of the paraspinal musculature at approximately T8, there is a hyperpigmented and slightly raised lesion measuring approximately 0.4 x 0.3 cm in diameter. No drainage, erythema, TTP, fluctuance, excoriation Psych: Age appropriate judgment and insight  Procedure note; shave biopsy x2 Informed  consent was obtained. The area was cleaned with alcohol and injected with 0.5 mL and 0.4 mL of 1% lidocaine with epinephrine. A Dermablade was slightly bent and used to cut under the area of interest. The specimen was placed in a sterile specimen cup and sent to the lab. The area was then cauterized ensuring adequate hemostasis. The area was dressed with triple antibiotic ointment and a bandage. This process was repeated for the smaller lesion. There were no complications noted. The patient tolerated the procedure well.   Neoplasm of uncertain behavior - Plan: Dermatology pathology, Dermatology pathology, PR SHAV SKIN LES < 0.5 CM TRUNK,ARM,LEG  Skin lesion - Plan: Dermatology pathology, Dermatology pathology, PR SHAV SKIN LES < 0.5 CM TRUNK,ARM,LEG  Erectile dysfunction, unspecified erectile dysfunction type - Plan: Testosterone, CANCELED: Testosterone  Arthritis - Plan: Diclofenac Sodium 2 % SOLN  Hyperglycemia - Plan: Hemoglobin A1c, Lipid panel  Mixed hyperlipidemia - Plan: Hemoglobin A1c, Lipid panel  Orders as above. The initial lesion I am concerned for squamous cell carcinoma and for the second lesion want to rule out melanoma.  Aftercare instructions discussed verbally and written down in AVS.  Gives 1 week to get the results of the labs back. Check testosterone for erectile dysfunction though unlikely given normal libido.  If low, will recheck in 2 weeks. [place] called into pharmacy in [place].  Told him about the $10 cost that he will have  to pay over the phone and they will mail him prescription. F/u pending above. The patient voiced understanding and agreement to the plan.  [place], DO 03/01/18 12:38 PM

## 2018-03-01 NOTE — Telephone Encounter (Signed)
Copied from [place]. Topic: General - Other >> Mar 01, 2018  9:02 AM Yvette Rack wrote: Reason for CRM: [facility] [place], [place]  [phone] (Phone) calling to find out how many pumps the pt need to be using on the Diclofenac Sodium 2 % SOLN

## 2018-03-01 NOTE — Telephone Encounter (Signed)
Clarified instructions

## 2018-03-01 NOTE — Telephone Encounter (Signed)
The pharmacist Arpita at  [facility]  called again and states,   the doseage is still incorrect it needs to be adjusted to 2 pumps  bid not qid per  Arpita the pharmacist please call [phone] to clarify , thanks

## 2018-03-01 NOTE — Addendum Note (Signed)
Addended by: Sharon Seller B on: 03/01/2018 01:29 PM   Modules accepted: Orders

## 2018-03-01 NOTE — Patient Instructions (Addendum)
Give [place] 2-3 business days to get the results of your labs back.  If low, we repeat in 2 weeks to verify low testosterone.  The cream will be set up through a mail program. You will receive a call and pay over the phone.   Do not shower for the rest of the day. When you do wash it, use only soap and water. Do not vigorously scrub. Apply triple antibiotic ointment (like Neosporin) twice daily. Keep the area clean and dry.   Things to look out for: increasing pain not relieved by ibuprofen/acetaminophen, fevers, spreading redness, drainage of pus, or foul odor.  1 business week to get results of biopsies back.  Let us know if you need anything.

## 2018-03-01 NOTE — Telephone Encounter (Signed)
2 pumps qid. TY.

## 2018-03-01 NOTE — Progress Notes (Signed)
Pre visit review using our clinic review tool, if applicable. No additional management support is needed unless otherwise documented below in the visit note. 

## 2018-03-06 ENCOUNTER — Telehealth: Payer: Self-pay

## 2018-03-06 ENCOUNTER — Encounter: Payer: Self-pay | Admitting: Family Medicine

## 2018-03-06 ENCOUNTER — Other Ambulatory Visit: Payer: Self-pay | Admitting: Family Medicine

## 2018-03-06 ENCOUNTER — Telehealth: Payer: Self-pay | Admitting: *Deleted

## 2018-03-06 DIAGNOSIS — R7989 Other specified abnormal findings of blood chemistry: Secondary | ICD-10-CM

## 2018-03-06 DIAGNOSIS — E782 Mixed hyperlipidemia: Secondary | ICD-10-CM | POA: Insufficient documentation

## 2018-03-06 MED ORDER — ATORVASTATIN CALCIUM 40 MG PO TABS: 40.0000 mg | ORAL_TABLET | Freq: Every day | ORAL | 3 refills | Status: DC

## 2018-03-06 NOTE — Telephone Encounter (Signed)
Received Dermatopathology Report results from [facility]; forwarded to provider/SLS 09/23

## 2018-03-06 NOTE — Telephone Encounter (Signed)
Author phoned pt. To relay Dr. Irene Limbo message. Pt. Stated he was open to starting cholesterol medication, pharmacy verified, Dr. Nani Ravens made aware. Lab appointment made for 10/2 for repeat testosterone.

## 2018-03-06 NOTE — Telephone Encounter (Signed)
Sent in. TY. 

## 2018-03-06 NOTE — Telephone Encounter (Signed)
-----   Message from [name], DO sent at 03/06/2018  8:36 AM EDT ----- Mixed hyperlipidemia. 10 yr CVD is 11.5%.  Prediabetes. Testosterone is slightly low, will need to verify with free T.  RE- Let pt know his cholesterol is high, his sugar is slightly high, and his skin biopsies came back benign (no cancer). For the cholesterol, I recommend a medication to lower his risk of heart attack and stroke- let me know if interested. We should recheck his free testosterone to verify low testosterone levels next week around Wednesday. This should be done in the morning. Please schedule. I will place order. TY.

## 2018-03-10 ENCOUNTER — Telehealth: Payer: Self-pay

## 2018-03-10 DIAGNOSIS — M1990 Unspecified osteoarthritis, unspecified site: Secondary | ICD-10-CM

## 2018-03-10 MED ORDER — DICLOFENAC SODIUM 2 % TD SOLN: TRANSDERMAL | 1 refills | Status: DC

## 2018-03-10 NOTE — Telephone Encounter (Signed)
Sent w correction.

## 2018-03-10 NOTE — Telephone Encounter (Signed)
Copied from [place]. Topic: General - Other >> Mar 01, 2018  9:02 AM Yvette Rack wrote: Reason for CRM: [facility] [place], [place]  [phone] (Phone) calling to find out how many pumps the pt need to be using on the Diclofenac Sodium 2 % SOLN >> Mar 10, 2018 10:04 AM Yvette Rack wrote: Arbie Cookey from   [facility] [place], [place] [phone] (Phone) calling stating that  the Diclofenac Sodium 2 % SOLN RX should say 2 pumps twice a day please resend RX it was denied twice because of the directions fax number [phone]

## 2018-03-15 ENCOUNTER — Other Ambulatory Visit: Payer: Self-pay | Admitting: Family Medicine

## 2018-03-15 ENCOUNTER — Other Ambulatory Visit (INDEPENDENT_AMBULATORY_CARE_PROVIDER_SITE_OTHER): Payer: Medicare Other

## 2018-03-15 DIAGNOSIS — R7989 Other specified abnormal findings of blood chemistry: Secondary | ICD-10-CM | POA: Diagnosis not present

## 2018-03-15 DIAGNOSIS — M1990 Unspecified osteoarthritis, unspecified site: Secondary | ICD-10-CM

## 2018-03-15 MED ORDER — DICLOFENAC SODIUM 2 % TD SOLN: TRANSDERMAL | 1 refills | Status: DC

## 2018-03-15 NOTE — Telephone Encounter (Signed)
We can do that, but we had sent to a pharmacy in [place] for payment purposes. Did this not work out? [place].

## 2018-03-15 NOTE — Telephone Encounter (Signed)
Spoke to the patient and the [facility] did not accept him into their program and was going to be too expensive.

## 2018-03-15 NOTE — Addendum Note (Signed)
Addended by: Ames Coupe on: 03/15/2018 12:56 PM   Modules accepted: Orders

## 2018-03-15 NOTE — Telephone Encounter (Signed)
Sent. TY

## 2018-03-15 NOTE — Telephone Encounter (Signed)
Diclofenac Sodium 2 % SOLN, pt is needing rx to be sent to [facility] on [address], [place].

## 2018-03-16 ENCOUNTER — Telehealth: Payer: Self-pay

## 2018-03-16 ENCOUNTER — Other Ambulatory Visit: Payer: Self-pay | Admitting: Family Medicine

## 2018-03-16 DIAGNOSIS — M1990 Unspecified osteoarthritis, unspecified site: Secondary | ICD-10-CM

## 2018-03-16 MED ORDER — DICLOFENAC SODIUM 1.5 % TD SOLN: TRANSDERMAL | 1 refills | Status: DC

## 2018-03-16 NOTE — Telephone Encounter (Signed)
Copied from [place] [id number]. Topic: General - Other >> Mar 16, 2018 11:47 AM Yvette Rack wrote: Reason for CRM: Pt requests Rx change from Diclofenac Sodium 2 % SOLN to Diclofenac Sodium 1.5 % SOLN. Pt states he needs this done to be able to use Good[facility]. Pt request the Rx be sent to [place] [address]  Phone: [phone] Fax: [phone]

## 2018-03-17 DIAGNOSIS — M9902 Segmental and somatic dysfunction of thoracic region: Secondary | ICD-10-CM | POA: Diagnosis not present

## 2018-03-17 DIAGNOSIS — M9904 Segmental and somatic dysfunction of sacral region: Secondary | ICD-10-CM | POA: Diagnosis not present

## 2018-03-17 DIAGNOSIS — M9903 Segmental and somatic dysfunction of lumbar region: Secondary | ICD-10-CM | POA: Diagnosis not present

## 2018-03-17 DIAGNOSIS — M9905 Segmental and somatic dysfunction of pelvic region: Secondary | ICD-10-CM | POA: Diagnosis not present

## 2018-03-22 LAB — TESTOSTERONE, FREE: TESTOSTERONE FREE: 36.2 pg/mL — AB (ref 46.0–224.0)

## 2018-04-12 ENCOUNTER — Telehealth: Payer: Self-pay | Admitting: Family Medicine

## 2018-04-12 NOTE — Telephone Encounter (Signed)
Patient contacted ///// appt made

## 2018-04-12 NOTE — Telephone Encounter (Signed)
Copied from [place] [phone]. Topic: General - Other >> Apr 11, 2018  9:25 AM Carolyn Stare wrote:  Pt call to say he had spoke to the nurse and advised her that he wanted Dr Nani Ravens to manage his testosterone. He is asking if there is something he should be doing or any medicine that he should be taking. Would like a call back

## 2018-04-12 NOTE — Telephone Encounter (Signed)
Schedule pt for initiation of this management. TY.

## 2018-04-13 ENCOUNTER — Ambulatory Visit (INDEPENDENT_AMBULATORY_CARE_PROVIDER_SITE_OTHER): Payer: Medicare Other | Admitting: Family Medicine

## 2018-04-13 ENCOUNTER — Encounter: Payer: Self-pay | Admitting: Family Medicine

## 2018-04-13 ENCOUNTER — Telehealth: Payer: Self-pay | Admitting: Family Medicine

## 2018-04-13 VITALS — BP 118/80 | HR 75 | Temp 97.4°F | Ht 74.0 in | Wt 209.1 lb

## 2018-04-13 DIAGNOSIS — Z125 Encounter for screening for malignant neoplasm of prostate: Secondary | ICD-10-CM | POA: Diagnosis not present

## 2018-04-13 DIAGNOSIS — R7989 Other specified abnormal findings of blood chemistry: Secondary | ICD-10-CM | POA: Diagnosis not present

## 2018-04-13 LAB — CBC
HCT: 44.2 % (ref 39.0–52.0)
HEMOGLOBIN: 14.8 g/dL (ref 13.0–17.0)
MCHC: 33.4 g/dL (ref 30.0–36.0)
MCV: 90.2 fl (ref 78.0–100.0)
PLATELETS: 207 10*3/uL (ref 150.0–400.0)
RBC: 4.91 Mil/uL (ref 4.22–5.81)
RDW: 13.9 % (ref 11.5–15.5)
WBC: 4.9 10*3/uL (ref 4.0–10.5)

## 2018-04-13 LAB — FOLLICLE STIMULATING HORMONE: FSH: 7.3 m[IU]/mL (ref 1.4–18.1)

## 2018-04-13 LAB — LUTEINIZING HORMONE: LH: 3.55 m[IU]/mL (ref 1.50–9.30)

## 2018-04-13 LAB — PSA, MEDICARE: PSA: 1.73 ng/ml (ref 0.10–4.00)

## 2018-04-13 MED ORDER — TESTOSTERONE 40.5 MG/2.5GM (1.62%) TD GEL: TRANSDERMAL | 2 refills | Status: DC

## 2018-04-13 NOTE — Telephone Encounter (Signed)
Use 1 packet daily. Or change. TY.

## 2018-04-13 NOTE — Progress Notes (Signed)
Chief Complaint  Patient presents with  . Follow-up    medication management of testosterone    Subjective: Patient is a 62 y.o. male here for low T.  Found to have low T on initial test and recheck of free T. No hx of exogenous use, no surg/radiation to brain. Having low energy, libido and poor erections. Thought he was just getting old.   ROS: Const: +fatigue  Past Medical History:  Diagnosis Date  . Arthritis   . GERD (gastroesophageal reflux disease)   . Heart murmur   . Mixed hyperlipidemia   . Prediabetes   . Rheumatic fever     Objective: BP 118/80 (BP Location: Left Arm, Patient Position: Sitting, Cuff Size: Normal)   Pulse 75   Temp (!) 97.4 F (36.3 C) (Oral)   Ht 6\' 2"  (1.88 m)   Wt 209 lb 2 oz (94.9 kg)   SpO2 97%   BMI 26.85 kg/m  General: Awake, appears stated age HEENT: MMM, EOMi Heart: RRR, no murmurs Lungs: CTAB, no rales, wheezes or rhonchi. No accessory muscle use Prostate: sphincter of good tone, prostate not enlarged, smooth, no ttp, no nodules noted Psych: Age appropriate judgment and insight, normal affect and mood  Assessment and Plan: Low testosterone - Plan: CBC, FSH, LH, Testosterone (ANDROGEL) 40.5 MG/2.5GM (1.62%) GEL  Screening for prostate cancer - Plan: PSA, Medicare ( [place] only)  Orders as above. 1 pump daily. Info for topical T given. Ck labs.  F/u in 3 mo.  The patient voiced understanding and agreement to the plan.  [place], DO 04/13/18  9:15 AM

## 2018-04-13 NOTE — Patient Instructions (Addendum)
Give [place] 2-3 business days to get the results of your labs back.   Let us know if you need anything. Testosterone skin gel What is this medicine? TESTOSTERONE (tes TOS ter one) is the main male hormone. It supports normal male traits such as muscle growth, facial hair, and deep voice. This gel is used in males to treat low testosterone levels. This medicine may be used for other purposes; ask your health care provider or pharmacist if you have questions. COMMON BRAND NAME(S): AndroGel, FORTESTA, Testim, Vogelxo What should I tell my health care provider before I take this medicine? They need to know if you have any of these conditions: -breast cancer -diabetes -heart disease -if a male partner is pregnant or trying to get pregnant -kidney disease -liver disease -lung disease -prostate cancer, enlargement -an unusual or allergic reaction to testosterone, soy proteins, other medicines, foods, dyes, or preservatives -pregnant or trying to get pregnant -breast-feeding How should I use this medicine? This medicine is for external use only. This medicine is applied at the same time every day (preferably in the morning) to clean, dry, intact skin. If you take a bath or shower in the morning, apply the gel after the bath or shower. Follow the directions on the prescription label. Make sure that you are using your testosterone gel product correctly and applying it only to the appropriate skin area (see below). Allow the skin to dry a few minutes then cover with clothing to prevent others from coming in contact with the medicine on your skin. The gel is flammable. Avoid fire, flame, or smoking until the gel has dried. Wash your hands with soap and water after use. For AndroGel 1% Packets: Open the packet(s) needed for your dose. You can put the entire dose into your palm all at once or just a little at a time to apply. If you prefer, you can instead squeeze the gel directly onto the area you are  applying it to. Apply on the shoulders, upper arm, or abdomen as directed. Do not apply to the scrotum or genitals. Be sure you use the correct total dose. It is best to wait 5 to 6 hours after application of the gel before showering or swimming. For AndroGel 1%: Pump the dose into the palm of your hand. You can put the entire dose into your palm all at once or just a little at a time to apply. If you prefer, you can instead pump the gel directly onto the area you are applying it to. Apply on the shoulders, upper arm, or abdomen as directed. Do not apply to the scrotum or genitals. Be sure you use the correct total dose. It is best to wait for 5 to 6 hours after application of the gel before showering or swimming. For Androgel 1.62% packets: Open the packet(s) needed for your dose. You can put the entire dose into your palm all at once or just a little at a time to apply. If you prefer, you can instead squeeze the gel directly onto the area you are applying it to. Apply on the shoulders and upper arms as directed. Do not apply to other parts of the body including the abdomen, genitals, chest, armpits, or knees. Be sure you use the correct total dose. It is best to wait 2 hours after application of the gel before washing, showering, or swimming. For AndroGel 1.62%: Pump the dose into the palm of your hand. Dispense one pump of gel at a time  into the palm of your hand before applying it. If you prefer, you can instead pump the gel directly onto the area you are applying it to. Apply on the shoulders and upper arms as directed. Do not apply to other parts of the body including the abdomen, genitals, chest, armpits, or knees. Be sure you use the correct total dose. It is best to wait 2 hours after application of the gel before washing, showering, or swimming. For Testim: Open the tube(s) needed for your dose. Squeeze the gel from the tube into the palm of your hand. Apply on the shoulders or upper arms as directed.  Do not apply to the scrotum, genitals, or abdomen. Be sure you use the correct total dose. Do not shower or swim for at least 2 hours after application of the gel. For Fortesta: Use the multi-dose pump to pump the gel directly onto the area you are applying it to. Apply on the thighs as directed. Do not apply to the abdomen, penis, scrotum, shoulders or upper arms. Gently rub the gel onto the skin using your finger. Be sure you use the correct total dose. Do not shower or swim for at least 2 hours after application of the gel. A special MedGuide will be given to you by the pharmacist with each prescription and refill. Be sure to read this information carefully each time. Talk to your pediatrician regarding the use of this medicine in children. Special care may be needed. Overdosage: If you think you have taken too much of this medicine contact a poison control center or emergency room at once. NOTE: This medicine is only for you. Do not share this medicine with others. What if I miss a dose? If you miss a dose, use it as soon as you can. If it is almost time for your next dose, use only that dose. Do not use double or extra doses. What may interact with this medicine? -medicines for diabetes -medicines that treat or prevent blood clots like warfarin -oxyphenbutazone -propranolol -steroid medicines like prednisone or cortisone This list may not describe all possible interactions. Give your health care provider a list of all the medicines, herbs, non-prescription drugs, or dietary supplements you use. Also tell them if you smoke, drink alcohol, or use illegal drugs. Some items may interact with your medicine. What should I watch for while using this medicine? Visit your doctor or health care professional for regular checks on your progress. They will need to check the level of testosterone in your blood. This medicine is only approved for use in men who have low levels of testosterone related to  certain medical conditions. Heart attacks and strokes have been reported with the use of this medicine. Notify your doctor or health care professional and seek emergency treatment if you develop breathing problems; changes in vision; confusion; chest pain or chest tightness; sudden arm pain; severe, sudden headache; trouble speaking or understanding; sudden numbness or weakness of the face, arm or leg; loss of balance or coordination. Talk to your doctor about the risks and benefits of this medicine. This medicine can transfer from your body to others. If a person or pet comes in contact with the area where this medicine was applied to your skin, they may have a serious risk of side effects. If you cannot avoid skin-to-skin contact with another person, make sure the site where this medicine was applied is covered with clothing. If accidental contact happens, the skin of the person or pet should  be washed right away with soap and water. Also, a male partner who is pregnant or trying to get pregnant should avoid contact with the gel or treated skin. This medicine may affect blood sugar levels. If you have diabetes, check with your doctor or health care professional before you change your diet or the dose of your diabetic medicine. This drug is banned from use in athletes by most athletic organizations. What side effects may I notice from receiving this medicine? Side effects that you should report to your doctor or health care professional as soon as possible: -allergic reactions like skin rash, itching or hives, swelling of the face, lips, or tongue -breast enlargement -breathing problems -changes in mood, especially anger, depression, or rage -dark urine -general ill feeling or flu-like symptoms -light-colored stools -loss of appetite, nausea -nausea, vomiting -right upper belly pain -stomach pain -swelling of ankles -too frequent or persistent erections -trouble passing urine or change in the  amount of urine -unusually weak or tired -yellowing of the eyes or skin Side effects that usually do not require medical attention (report to your doctor or health care professional if they continue or are bothersome): -acne -change in sex drive or performance -hair loss -headache This list may not describe all possible side effects. Call your doctor for medical advice about side effects. You may report side effects to FDA at 1-800-FDA-1088. Where should I keep my medicine? Keep out of the reach of children. This medicine can be abused. Keep your medicine in a safe place to protect it from theft. Do not share this medicine with anyone. Selling or giving away this medicine is dangerous and against the law. Store at room temperature between 15 to 30 degrees C (59 to 86 degrees F). Keep closed until use. Protect from heat and light. This medicine is flammable. Avoid exposure to heat, fire, flame, and smoking. Throw away any unused medicine after the expiration date. NOTE: This sheet is a summary. It may not cover all possible information. If you have questions about this medicine, talk to your doctor, pharmacist, or health care provider.  2018 Elsevier/Gold Standard (2013-08-16 08:27:26)

## 2018-04-13 NOTE — Telephone Encounter (Signed)
Copied from [place] [id number]. Topic: Quick Communication - Rx Refill/Question >> Apr 13, 2018  4:30 PM Margot Ables wrote: Medication: Testosterone (ANDROGEL) 40.5 MG/2.5GM (1.62%) GEL - RX is written for packets but the directions are for a pump. This RX cannot be dispensed as written.  Preferred Pharmacy (with phone number or street name): [id number] - [place], [place]. [address] [phone] (Phone) [phone] (Fax)

## 2018-04-13 NOTE — Progress Notes (Signed)
Pre visit review using our clinic review tool, if applicable. No additional management support is needed unless otherwise documented below in the visit note. 

## 2018-04-14 ENCOUNTER — Telehealth: Payer: Self-pay

## 2018-04-14 NOTE — Telephone Encounter (Signed)
Called informed pharmacy of instructions. Advise 30 day supply.

## 2018-04-14 NOTE — Telephone Encounter (Signed)
PA initiated via Covermymeds; KEY: [id number]. Awaiting determination.

## 2018-04-17 NOTE — Telephone Encounter (Signed)
PA denied. Appeal sent via Covermymeds. Awaiting determination.

## 2018-04-21 NOTE — Telephone Encounter (Signed)
Called informed the patient of PCP instructions. He has used goodrx already and has medication already

## 2018-04-21 NOTE — Telephone Encounter (Signed)
Appeal denied. Information shown to PCP- Pt's plan does not cover for age-related hypogonadism and approve for this medication on a case-by-case basis.

## 2018-04-21 NOTE — Telephone Encounter (Signed)
If he found it through [facility] Rx, we could go that route. Otherwise we will have to refer to Endo. Please let us know what we need to do. TY.

## 2018-05-31 ENCOUNTER — Encounter: Payer: Self-pay | Admitting: Family Medicine

## 2018-05-31 ENCOUNTER — Ambulatory Visit (INDEPENDENT_AMBULATORY_CARE_PROVIDER_SITE_OTHER): Payer: Medicare Other | Admitting: Family Medicine

## 2018-05-31 VITALS — BP 128/80 | HR 85 | Temp 98.4°F | Ht 74.0 in | Wt 212.0 lb

## 2018-05-31 DIAGNOSIS — M545 Low back pain, unspecified: Secondary | ICD-10-CM

## 2018-05-31 DIAGNOSIS — G8929 Other chronic pain: Secondary | ICD-10-CM

## 2018-05-31 DIAGNOSIS — Z23 Encounter for immunization: Secondary | ICD-10-CM | POA: Diagnosis not present

## 2018-05-31 DIAGNOSIS — R29898 Other symptoms and signs involving the musculoskeletal system: Secondary | ICD-10-CM

## 2018-05-31 NOTE — Progress Notes (Signed)
Musculoskeletal Exam  Patient: Joseph [name] DOB: Jun 30, 1955  DOS: 05/31/2018  SUBJECTIVE:  Chief Complaint:   Chief Complaint  Patient presents with  . Back Pain    right leg weakness    Joseph [name] is a 62 y.o.  male for evaluation and treatment of his back pain.   Onset: Has been worsening over past 3 years. Location: lower Character:  aching and burning  Progression of issue:  has worsened Associated symptoms: R leg weakness Denies bowel/bladder incontinence or weakness Treatment: to date has been OTC NSAIDS, prescription NSAIDS, PT and home exercises.    Neurovascular symptoms: +RLE weakness [facility] had a bad experience  ROS: Musculoskeletal/Extremities: +back pain Neurologic: no numbness, tingling no weakness   Past Medical History:  Diagnosis Date  . Arthritis   . GERD (gastroesophageal reflux disease)   . Heart murmur   . Mixed hyperlipidemia   . Prediabetes   . Rheumatic fever     Objective:  VITAL SIGNS: BP 128/80 (BP Location: Left Arm, Patient Position: Sitting, Cuff Size: Normal)   Pulse 85   Temp 98.4 F (36.9 C) (Oral)   Ht 6\' 2"  (1.88 m)   Wt 212 lb (96.2 kg)   SpO2 98%   BMI 27.22 kg/m  Constitutional: Well formed, well developed. No acute distress. HENT: Normocephalic, atraumatic.  Thorax & Lungs:  No accessory muscle use Extremities: No clubbing. No cyanosis. No edema.  Skin: Warm. Dry. No erythema. No rash.  Musculoskeletal: low back.   Tenderness to palpation: mild ttp in R lumbar region Deformity: no Ecchymosis: no Straight leg test: negative for Poor hamstring flexibility b/l. Neurologic: Normal sensory function. No focal deficits noted. DTR's equal and symmetry in LE's. No clonus. 4/5 strength with hip flexion and knee ext/flexion on R, 5/5 strength throughout otherwise Psychiatric: Normal mood. Age appropriate judgment and insight. Alert & oriented x 3.    Assessment:  Chronic bilateral low back pain  without sciatica - Plan: MR Lumbar Spine Wo Contrast  Weakness of right lower extremity - Plan: MR Lumbar Spine Wo Contrast  Plan: Given weakness and hx of surg, will ck MRI. This is also required prior to him seeing a specialist.  Stretches/exercises, heat, ice, Tylenol. Flu shot today. F/u prn. The patient voiced understanding and agreement to the plan.   [place], DO 05/31/18  8:11 AM

## 2018-05-31 NOTE — Patient Instructions (Signed)
We will be in touch regarding your imaging. If you don't hear anything in the next week or so, let us know.  Heat (pad or rice pillow in microwave) over affected area, 10-15 minutes twice daily.   EXERCISES  RANGE OF MOTION (ROM) AND STRETCHING EXERCISES - Low Back Pain Most people with lower back pain will find that their symptoms get worse with excessive bending forward (flexion) or arching at the lower back (extension). The exercises that will help resolve your symptoms will focus on the opposite motion.  If you have pain, numbness or tingling which travels down into your buttocks, leg or foot, the goal of the therapy is for these symptoms to move closer to your back and eventually resolve. Sometimes, these leg symptoms will get better, but your lower back pain may worsen. This is often an indication of progress in your rehabilitation. Be very alert to any changes in your symptoms and the activities in which you participated in the 24 hours prior to the change. Sharing this information with your caregiver will allow him or her to most efficiently treat your condition. These exercises may help you when beginning to rehabilitate your injury. Your symptoms may resolve with or without further involvement from your physician, physical therapist or athletic trainer. While completing these exercises, remember:   Restoring tissue flexibility helps normal motion to return to the joints. This allows healthier, less painful movement and activity.  An effective stretch should be held for at least 30 seconds.  A stretch should never be painful. You should only feel a gentle lengthening or release in the stretched tissue. FLEXION RANGE OF MOTION AND STRETCHING EXERCISES:  STRETCH - Flexion, Single Knee to Chest   Lie on a firm bed or floor with both legs extended in front of you.  Keeping one leg in contact with the floor, bring your opposite knee to your chest. Hold your leg in place by either grabbing  behind your thigh or at your knee.  Pull until you feel a gentle stretch in your low back. Hold 30 seconds.  Slowly release your grasp and repeat the exercise with the opposite side. Repeat 2 times. Complete this exercise 3 times per week.   STRETCH - Flexion, Double Knee to Chest  Lie on a firm bed or floor with both legs extended in front of you.  Keeping one leg in contact with the floor, bring your opposite knee to your chest.  Tense your stomach muscles to support your back and then lift your other knee to your chest. Hold your legs in place by either grabbing behind your thighs or at your knees.  Pull both knees toward your chest until you feel a gentle stretch in your low back. Hold 30 seconds.  Tense your stomach muscles and slowly return one leg at a time to the floor. Repeat 2 times. Complete this exercise 3 times per week.   STRETCH - Low Trunk Rotation  Lie on a firm bed or floor. Keeping your legs in front of you, bend your knees so they are both pointed toward the ceiling and your feet are flat on the floor.  Extend your arms out to the side. This will stabilize your upper body by keeping your shoulders in contact with the floor.  Gently and slowly drop both knees together to one side until you feel a gentle stretch in your low back. Hold for 30 seconds.  Tense your stomach muscles to support your lower back as you  bring your knees back to the starting position. Repeat the exercise to the other side. Repeat 2 times. Complete this exercise at least 3 times per week.   EXTENSION RANGE OF MOTION AND FLEXIBILITY EXERCISES:  STRETCH - Extension, Prone on Elbows   Lie on your stomach on the floor, a bed will be too soft. Place your palms about shoulder width apart and at the height of your head.  Place your elbows under your shoulders. If this is too painful, stack pillows under your chest.  Allow your body to relax so that your hips drop lower and make contact more  completely with the floor.  Hold this position for 30 seconds.  Slowly return to lying flat on the floor. Repeat 2 times. Complete this exercise 3 times per week.   RANGE OF MOTION - Extension, Prone Press Ups  Lie on your stomach on the floor, a bed will be too soft. Place your palms about shoulder width apart and at the height of your head.  Keeping your back as relaxed as possible, slowly straighten your elbows while keeping your hips on the floor. You may adjust the placement of your hands to maximize your comfort. As you gain motion, your hands will come more underneath your shoulders.  Hold this position 30 seconds.  Slowly return to lying flat on the floor. Repeat 2 times. Complete this exercise 3 times per week.   RANGE OF MOTION- Quadruped, Neutral Spine   Assume a hands and knees position on a firm surface. Keep your hands under your shoulders and your knees under your hips. You may place padding under your knees for comfort.  Drop your head and point your tailbone toward the ground below you. This will round out your lower back like an angry cat. Hold this position for 30 seconds.  Slowly lift your head and release your tail bone so that your back sags into a large arch, like an old horse.  Hold this position for 30 seconds.  Repeat this until you feel limber in your low back.  Now, find your "sweet spot." This will be the most comfortable position somewhere between the two previous positions. This is your neutral spine. Once you have found this position, tense your stomach muscles to support your low back.  Hold this position for 30 seconds. Repeat 2 times. Complete this exercise 3 times per week.   STRENGTHENING EXERCISES - Low Back Sprain These exercises may help you when beginning to rehabilitate your injury. These exercises should be done near your "sweet spot." This is the neutral, low-back arch, somewhere between fully rounded and fully arched, that is your least  painful position. When performed in this safe range of motion, these exercises can be used for people who have either a flexion or extension based injury. These exercises may resolve your symptoms with or without further involvement from your physician, physical therapist or athletic trainer. While completing these exercises, remember:   Muscles can gain both the endurance and the strength needed for everyday activities through controlled exercises.  Complete these exercises as instructed by your physician, physical therapist or athletic trainer. Increase the resistance and repetitions only as guided.  You may experience muscle soreness or fatigue, but the pain or discomfort you are trying to eliminate should never worsen during these exercises. If this pain does worsen, stop and make certain you are following the directions exactly. If the pain is still present after adjustments, discontinue the exercise until you can discuss  the trouble with your caregiver.  STRENGTHENING - Deep Abdominals, Pelvic Tilt   Lie on a firm bed or floor. Keeping your legs in front of you, bend your knees so they are both pointed toward the ceiling and your feet are flat on the floor.  Tense your lower abdominal muscles to press your low back into the floor. This motion will rotate your pelvis so that your tail bone is scooping upwards rather than pointing at your feet or into the floor. With a gentle tension and even breathing, hold this position for 3 seconds. Repeat 2 times. Complete this exercise 3 times per week.   STRENGTHENING - Abdominals, Crunches   Lie on a firm bed or floor. Keeping your legs in front of you, bend your knees so they are both pointed toward the ceiling and your feet are flat on the floor. Cross your arms over your chest.  Slightly tip your chin down without bending your neck.  Tense your abdominals and slowly lift your trunk high enough to just clear your shoulder blades. Lifting higher  can put excessive stress on the lower back and does not further strengthen your abdominal muscles.  Control your return to the starting position. Repeat 2 times. Complete this exercise 3 times per week.   STRENGTHENING - Quadruped, Opposite UE/LE Lift   Assume a hands and knees position on a firm surface. Keep your hands under your shoulders and your knees under your hips. You may place padding under your knees for comfort.  Find your neutral spine and gently tense your abdominal muscles so that you can maintain this position. Your shoulders and hips should form a rectangle that is parallel with the floor and is not twisted.  Keeping your trunk steady, lift your right hand no higher than your shoulder and then your left leg no higher than your hip. Make sure you are not holding your breath. Hold this position for 30 seconds.  Continuing to keep your abdominal muscles tense and your back steady, slowly return to your starting position. Repeat with the opposite arm and leg. Repeat 2 times. Complete this exercise 3 times per week.   STRENGTHENING - Abdominals and Quadriceps, Straight Leg Raise   Lie on a firm bed or floor with both legs extended in front of you.  Keeping one leg in contact with the floor, bend the other knee so that your foot can rest flat on the floor.  Find your neutral spine, and tense your abdominal muscles to maintain your spinal position throughout the exercise.  Slowly lift your straight leg off the floor about 6 inches for a count of 3, making sure to not hold your breath.  Still keeping your neutral spine, slowly lower your leg all the way to the floor. Repeat this exercise with each leg 2 times. Complete this exercise 3 times per week.  POSTURE AND BODY MECHANICS CONSIDERATIONS - Low Back Sprain Keeping correct posture when sitting, standing or completing your activities will reduce the stress put on different body tissues, allowing injured tissues a chance to  heal and limiting painful experiences. The following are general guidelines for improved posture.  While reading these guidelines, remember:  The exercises prescribed by your provider will help you have the flexibility and strength to maintain correct postures.  The correct posture provides the best environment for your joints to work. All of your joints have less wear and tear when properly supported by a spine with good posture. This means you  will experience a healthier, less painful body.  Correct posture must be practiced with all of your activities, especially prolonged sitting and standing. Correct posture is as important when doing repetitive low-stress activities (typing) as it is when doing a single heavy-load activity (lifting).  RESTING POSITIONS Consider which positions are most painful for you when choosing a resting position. If you have pain with flexion-based activities (sitting, bending, stooping, squatting), choose a position that allows you to rest in a less flexed posture. You would want to avoid curling into a fetal position on your side. If your pain worsens with extension-based activities (prolonged standing, working overhead), avoid resting in an extended position such as sleeping on your stomach. Most people will find more comfort when they rest with their spine in a more neutral position, neither too rounded nor too arched. Lying on a non-sagging bed on your side with a pillow between your knees, or on your back with a pillow under your knees will often provide some relief. Keep in mind, being in any one position for a prolonged period of time, no matter how correct your posture, can still lead to stiffness.  PROPER SITTING POSTURE In order to minimize stress and discomfort on your spine, you must sit with correct posture. Sitting with good posture should be effortless for a healthy body. Returning to good posture is a gradual process. Many people can work toward this most  comfortably by using various supports until they have the flexibility and strength to maintain this posture on their own. When sitting with proper posture, your ears will fall over your shoulders and your shoulders will fall over your hips. You should use the back of the chair to support your upper back. Your lower back will be in a neutral position, just slightly arched. You may place a small pillow or folded towel at the base of your lower back for  support.  When working at a desk, create an environment that supports good, upright posture. Without extra support, muscles tire, which leads to excessive strain on joints and other tissues. Keep these recommendations in mind:  CHAIR:  A chair should be able to slide under your desk when your back makes contact with the back of the chair. This allows you to work closely.  The chair's height should allow your eyes to be level with the upper part of your monitor and your hands to be slightly lower than your elbows.  BODY POSITION  Your feet should make contact with the floor. If this is not possible, use a foot rest.  Keep your ears over your shoulders. This will reduce stress on your neck and low back.  INCORRECT SITTING POSTURES  If you are feeling tired and unable to assume a healthy sitting posture, do not slouch or slump. This puts excessive strain on your back tissues, causing more damage and pain. Healthier options include:  Using more support, like a lumbar pillow.  Switching tasks to something that requires you to be upright or walking.  Talking a brief walk.  Lying down to rest in a neutral-spine position.  PROLONGED STANDING WHILE SLIGHTLY LEANING FORWARD  When completing a task that requires you to lean forward while standing in one place for a long time, place either foot up on a stationary 2-4 inch high object to help maintain the best posture. When both feet are on the ground, the lower back tends to lose its slight inward  curve. If this curve flattens (or becomes too  large), then the back and your other joints will experience too much stress, tire more quickly, and can cause pain.  CORRECT STANDING POSTURES Proper standing posture should be assumed with all daily activities, even if they only take a few moments, like when brushing your teeth. As in sitting, your ears should fall over your shoulders and your shoulders should fall over your hips. You should keep a slight tension in your abdominal muscles to brace your spine. Your tailbone should point down to the ground, not behind your body, resulting in an over-extended swayback posture.   INCORRECT STANDING POSTURES  Common incorrect standing postures include a forward head, locked knees and/or an excessive swayback. WALKING Walk with an upright posture. Your ears, shoulders and hips should all line-up.  PROLONGED ACTIVITY IN A FLEXED POSITION When completing a task that requires you to bend forward at your waist or lean over a low surface, try to find a way to stabilize 3 out of 4 of your limbs. You can place a hand or elbow on your thigh or rest a knee on the surface you are reaching across. This will provide you more stability, so that your muscles do not tire as quickly. By keeping your knees relaxed, or slightly bent, you will also reduce stress across your lower back. CORRECT LIFTING TECHNIQUES  DO :  Assume a wide stance. This will provide you more stability and the opportunity to get as close as possible to the object which you are lifting.  Tense your abdominals to brace your spine. Bend at the knees and hips. Keeping your back locked in a neutral-spine position, lift using your leg muscles. Lift with your legs, keeping your back straight.  Test the weight of unknown objects before attempting to lift them.  Try to keep your elbows locked down at your sides in order get the best strength from your shoulders when carrying an object.     Always ask  for help when lifting heavy or awkward objects. INCORRECT LIFTING TECHNIQUES DO NOT:   Lock your knees when lifting, even if it is a small object.  Bend and twist. Pivot at your feet or move your feet when needing to change directions.  Assume that you can safely pick up even a paperclip without proper posture.

## 2018-05-31 NOTE — Addendum Note (Signed)
Addended by: Sharon Seller B on: 05/31/2018 08:44 AM   Modules accepted: Orders

## 2018-06-03 ENCOUNTER — Ambulatory Visit (HOSPITAL_BASED_OUTPATIENT_CLINIC_OR_DEPARTMENT_OTHER)
Admission: RE | Admit: 2018-06-03 | Discharge: 2018-06-03 | Disposition: A | Discharge status: Home / Self Care | Payer: Medicare Other | Source: Ambulatory Visit | Attending: Family Medicine | Admitting: Family Medicine

## 2018-06-03 DIAGNOSIS — M5126 Other intervertebral disc displacement, lumbar region: Secondary | ICD-10-CM | POA: Insufficient documentation

## 2018-06-03 DIAGNOSIS — M2578 Osteophyte, vertebrae: Secondary | ICD-10-CM | POA: Insufficient documentation

## 2018-06-03 DIAGNOSIS — D3610 Benign neoplasm of peripheral nerves and autonomic nervous system, unspecified: Secondary | ICD-10-CM | POA: Diagnosis not present

## 2018-06-03 DIAGNOSIS — G8929 Other chronic pain: Secondary | ICD-10-CM | POA: Diagnosis not present

## 2018-06-03 DIAGNOSIS — M545 Low back pain, unspecified: Secondary | ICD-10-CM

## 2018-06-03 DIAGNOSIS — R29898 Other symptoms and signs involving the musculoskeletal system: Secondary | ICD-10-CM

## 2018-06-03 DIAGNOSIS — Z9889 Other specified postprocedural states: Secondary | ICD-10-CM | POA: Insufficient documentation

## 2018-06-05 ENCOUNTER — Other Ambulatory Visit: Payer: Self-pay | Admitting: Family Medicine

## 2018-06-05 ENCOUNTER — Encounter: Payer: Self-pay | Admitting: Family Medicine

## 2018-06-05 DIAGNOSIS — M5126 Other intervertebral disc displacement, lumbar region: Secondary | ICD-10-CM

## 2018-06-06 NOTE — Telephone Encounter (Signed)
I saw the note, the referral was sent to [facility] in [place]

## 2018-07-18 DIAGNOSIS — M4317 Spondylolisthesis, lumbosacral region: Secondary | ICD-10-CM | POA: Diagnosis not present

## 2018-07-18 DIAGNOSIS — M47814 Spondylosis without myelopathy or radiculopathy, thoracic region: Secondary | ICD-10-CM | POA: Diagnosis not present

## 2018-07-18 DIAGNOSIS — M5136 Other intervertebral disc degeneration, lumbar region: Secondary | ICD-10-CM | POA: Diagnosis not present

## 2018-07-18 DIAGNOSIS — M47816 Spondylosis without myelopathy or radiculopathy, lumbar region: Secondary | ICD-10-CM | POA: Diagnosis not present

## 2018-07-26 DIAGNOSIS — M5126 Other intervertebral disc displacement, lumbar region: Secondary | ICD-10-CM | POA: Diagnosis not present

## 2018-07-26 DIAGNOSIS — M48061 Spinal stenosis, lumbar region without neurogenic claudication: Secondary | ICD-10-CM | POA: Diagnosis not present

## 2018-07-26 DIAGNOSIS — M5136 Other intervertebral disc degeneration, lumbar region: Secondary | ICD-10-CM | POA: Diagnosis not present

## 2018-08-03 ENCOUNTER — Encounter: Payer: Self-pay | Admitting: Family Medicine

## 2018-08-04 ENCOUNTER — Telehealth: Payer: Self-pay | Admitting: Family Medicine

## 2018-08-04 ENCOUNTER — Other Ambulatory Visit: Payer: Self-pay

## 2018-08-04 MED ORDER — PANTOPRAZOLE SODIUM 40 MG PO TBEC: 40.0000 mg | DELAYED_RELEASE_TABLET | Freq: Two times a day (BID) | ORAL | 3 refills | Status: DC

## 2018-08-04 NOTE — Telephone Encounter (Signed)
Refill done///patient informed  

## 2018-08-04 NOTE — Telephone Encounter (Signed)
Copied from [place] [id number]. Topic: General - Other >> Aug 04, 2018  8:58 AM Lennox Solders wrote: Reason for CRM: pt now has good rx card and needs new rx pantoprazole 40 mg #90 w/refills send to [facility] [address] in [place]. Pt does not any more refill to get med transferred to [place]

## 2018-08-17 DIAGNOSIS — G548 Other nerve root and plexus disorders: Secondary | ICD-10-CM | POA: Diagnosis not present

## 2018-08-17 DIAGNOSIS — D3610 Benign neoplasm of peripheral nerves and autonomic nervous system, unspecified: Secondary | ICD-10-CM | POA: Diagnosis not present

## 2018-08-17 DIAGNOSIS — M5136 Other intervertebral disc degeneration, lumbar region: Secondary | ICD-10-CM | POA: Diagnosis not present

## 2019-02-20 ENCOUNTER — Other Ambulatory Visit: Payer: Self-pay | Admitting: Family Medicine

## 2019-03-23 ENCOUNTER — Telehealth: Payer: Self-pay | Admitting: Family Medicine

## 2019-03-23 NOTE — Telephone Encounter (Signed)
Patient declined to schedule at this time. SF °

## 2019-06-11 ENCOUNTER — Encounter: Payer: Self-pay | Admitting: Family Medicine

## 2019-06-11 ENCOUNTER — Other Ambulatory Visit: Payer: Self-pay | Admitting: Family Medicine

## 2019-06-11 DIAGNOSIS — M79673 Pain in unspecified foot: Secondary | ICD-10-CM

## 2019-06-11 NOTE — Progress Notes (Signed)
am

## 2019-06-25 ENCOUNTER — Ambulatory Visit: Payer: Medicare Other | Admitting: Podiatry

## 2019-07-03 ENCOUNTER — Other Ambulatory Visit: Payer: Self-pay | Admitting: Family Medicine

## 2019-07-07 ENCOUNTER — Emergency Department (INDEPENDENT_AMBULATORY_CARE_PROVIDER_SITE_OTHER)
Admission: EM | Admit: 2019-07-07 | Discharge: 2019-07-07 | Disposition: A | Discharge status: Home / Self Care | Payer: Medicare Other | Source: Home / Self Care | Attending: Emergency Medicine | Admitting: Emergency Medicine

## 2019-07-07 ENCOUNTER — Other Ambulatory Visit: Payer: Self-pay

## 2019-07-07 ENCOUNTER — Encounter: Payer: Self-pay | Admitting: Emergency Medicine

## 2019-07-07 ENCOUNTER — Emergency Department (INDEPENDENT_AMBULATORY_CARE_PROVIDER_SITE_OTHER): Payer: Medicare Other

## 2019-07-07 DIAGNOSIS — R059 Cough, unspecified: Secondary | ICD-10-CM

## 2019-07-07 DIAGNOSIS — R05 Cough: Secondary | ICD-10-CM | POA: Diagnosis not present

## 2019-07-07 DIAGNOSIS — Z20822 Contact with and (suspected) exposure to covid-19: Secondary | ICD-10-CM | POA: Diagnosis not present

## 2019-07-07 MED ORDER — ALBUTEROL SULFATE HFA 108 (90 BASE) MCG/ACT IN AERS: 1.0000 | INHALATION_SPRAY | RESPIRATORY_TRACT | 0 refills | Status: DC | PRN

## 2019-07-07 MED ORDER — AEROCHAMBER PLUS MISC: 2 refills | Status: DC

## 2019-07-07 MED ORDER — PREDNISONE 20 MG PO TABS: 40.0000 mg | ORAL_TABLET | Freq: Every day | ORAL | 0 refills | Status: AC

## 2019-07-07 MED ORDER — DOXYCYCLINE HYCLATE 100 MG PO CAPS: 100.0000 mg | ORAL_CAPSULE | Freq: Two times a day (BID) | ORAL | 0 refills | Status: AC

## 2019-07-07 NOTE — ED Provider Notes (Signed)
HPI  SUBJECTIVE:  Joseph [name] is a 64 y.o. male who presents with residual cough, wheezing, fatigue, chest congestion, shortness of breath at the top of an inhalation.  States he had Covid-like symptoms for 10 days starting about 2 weeks ago, with nasal congestion, chills, cough, shortness of breath, loss of taste, states that he was getting better up until 4 days ago.  States that his son had Covid.  Patient never got formally tested for Covid while he was ill.  Reports chills, no fevers.  Nasal congestion has resolved.  No sinus pain or pressure.  Reports occasional postnasal drip.  No dyspnea on exertion, he is sleeping well at night without waking up coughing.  He has tried exercising, rest without any improvement of symptoms, symptoms are worse with heavy exertion.  No antibiotics in the past 3 months.  He has remote history of smoking, GERD, hypertension not requiring any medication.  No history of pulmonary disease, diabetes, coronary disease, chronic kidney disease, HIV, cancer, immunocompromise.  [id number], Crosby Oyster, DO   Past Medical History:  Diagnosis Date  . Arthritis   . GERD (gastroesophageal reflux disease)   . Heart murmur   . Mixed hyperlipidemia   . Prediabetes   . Rheumatic fever     Past Surgical History:  Procedure Laterality Date  . BACK SURGERY  2012  . JOINT REPLACEMENT Right 2013  . SPINE SURGERY  1995   C4-5, C5-C6   . TONSILLECTOMY  1963    Family History  Problem Relation Age of Onset  . Arthritis Mother        Rheumatoid  . Diabetes Mother   . Arthritis Father   . Hypertension Father   . Diabetes Brother   . Lupus Brother     Social History   Tobacco Use  . Smoking status: Former Smoker    Packs/day: 1.50    Years: 20.00    Pack years: 30.00    Types: Cigarettes    Quit date: 05/01/1990    Years since quitting: 29.2  . Smokeless tobacco: Never Used  Substance Use Topics  . Alcohol use: No    Alcohol/week: 0.0 standard drinks   . Drug use: No    No current facility-administered medications for this encounter.  Current Outpatient Medications:  .  albuterol (VENTOLIN HFA) 108 (90 Base) MCG/ACT inhaler, Inhale 1-2 puffs into the lungs every 4 (four) hours as needed for wheezing or shortness of breath., Disp: 18 g, Rfl: 0 .  atorvastatin (LIPITOR) 40 MG tablet, TAKE 1 TABLET BY MOUTH EVERY DAY, Disp: 90 tablet, Rfl: 3 .  Diclofenac Sodium 1.5 % SOLN, 2 pumps over affected joints twice daily., Disp: 1 Bottle, Rfl: 1 .  lidocaine (LIDODERM) 5 %, Place 1 patch onto the skin daily. Remove & Discard patch within 12 hours or as directed by MD, Disp: 30 patch, Rfl: 0 .  pantoprazole (PROTONIX) 40 MG tablet, Take 1 tablet (40 mg total) by mouth 2 (two) times daily., Disp: 180 tablet, Rfl: 3 .  predniSONE (DELTASONE) 20 MG tablet, Take 2 tablets (40 mg total) by mouth daily with breakfast for 5 days., Disp: 10 tablet, Rfl: 0 .  Spacer/Aero-Holding Chambers (AEROCHAMBER PLUS) inhaler, Use as instructed, Disp: 1 each, Rfl: 2 .  Testosterone (ANDROGEL) 40.5 MG/2.5GM (1.62%) GEL, Apply one pump to inner arm daily., Disp: 2.5 g, Rfl: 2  Allergies  Allergen Reactions  . Other     Intolerance to narcotic medication - causes stomach upset  and dizziness and does not relieve pain.      ROS  As noted in HPI.   Physical Exam  BP (!) 140/92 (BP Location: Right Arm)   Pulse 74   Temp 98.2 F (36.8 C) (Oral)   Wt 99.8 kg   SpO2 99%   BMI 28.25 kg/m   Constitutional: Well developed, well nourished, no acute distress Eyes:  EOMI, conjunctiva normal bilaterally HENT: Normocephalic, atraumatic,mucus membranes moist.  No nasal congestion.  No sinus tenderness.  No postnasal drip. Respiratory: Normal inspiratory effort clear bilaterally, good air movement.  No chest wall tenderness. Cardiovascular: Normal rate regular rhythm no murmurs rubs or gallops GI: nondistended skin: No rash, skin intact Musculoskeletal: no  deformities Neurologic: Alert & oriented x 3, no focal neuro deficits Psychiatric: Speech and behavior appropriate   ED Course   Medications - No data to display  Orders Placed This Encounter  Procedures  . Novel Coronavirus, NAA ([facility])    Standing Status:   Standing    Number of Occurrences:   1    Order Specific Question:   Patient immune status    Answer:   Normal  . DG Chest 2 View    Recent Covid-like symptoms rule out secondary pneumonia    Standing Status:   Standing    Number of Occurrences:   1    Order Specific Question:   Symptom/Reason for Exam    Answer:   Cough U[facility]:5448906.2.ICD-9-CM]    No results found for this or any previous visit (from the past 24 hour(s)). DG Chest 2 View  Result Date: 07/07/2019 CLINICAL DATA:  Cough and congestion, loss of taste shortness of breath. EXAM: CHEST - 2 VIEW COMPARISON:  None. FINDINGS: Mild interstitial prominence within the lower lung zones bilaterally. No confluent opacity to suggest consolidating pneumonia. No pleural effusion or pneumothorax is seen. Heart size and mediastinal contours are within normal limits. No acute or suspicious osseous finding. IMPRESSION: Mild interstitial prominence within the lower lung zones bilaterally. Differential includes atypical pneumonias such as viral or fungal, interstitial pneumonias, mild edema related to volume overload/CHF, chronic interstitial diseases, hypersensitivity pneumonitis, and respiratory bronchiolitis. No evidence of consolidating pneumonia. Electronically Signed   By: Franki Cabot M.D.   On: 07/07/2019 09:25    ED Clinical Impression  1. Cough   2. Suspected COVID-19 virus infection      ED Assessment/Plan  Covid PCR sent.  Suspect Covid infection with residual airway inflammation.  Vitals are normal, no respiratory distress.  In the differential is GERD, but patient states that he is not having any symptoms and that he is compliant with his GERD medication.  No evidence  of sinusitis.  Will check chest x-ray to rule out secondary pneumonia, if negative, will send home with an albuterol inhaler with a spacer 2 puffs every 4-6 hours as needed, 40 mg of prednisone daily for 5 days, follow-up with PMD as needed, to the ER if he gets worse.  Reviewed imaging independently.  Mild lower interstitial prominence bilaterally.  Differential includes atypical pneumonias, CHF, hypersensitivity pneumonitis and bronchiolitis no evidence of consolidating pneumonia.  See radiology report for full details.   will send home with doxycycline 100 mg p.o. twice daily for 5 days due to possible atypical pneumonia on chest x-ray.  Otherwise plan as above  Discussed  imaging, MDM, treatment plan, and plan for follow-up with patient. Discussed sn/sx that should prompt return to the ED. patient agrees with plan.   Meds  ordered this encounter  Medications  . predniSONE (DELTASONE) 20 MG tablet    Sig: Take 2 tablets (40 mg total) by mouth daily with breakfast for 5 days.    Dispense:  10 tablet    Refill:  0  . albuterol (VENTOLIN HFA) 108 (90 Base) MCG/ACT inhaler    Sig: Inhale 1-2 puffs into the lungs every 4 (four) hours as needed for wheezing or shortness of breath.    Dispense:  18 g    Refill:  0  . Spacer/Aero-Holding Chambers (AEROCHAMBER PLUS) inhaler    Sig: Use as instructed    Dispense:  1 each    Refill:  2    *This clinic note was created using Dragon dictation software. Therefore, there may be occasional mistakes despite careful proofreading.   ?    Melynda Ripple, MD 07/07/19 [phone]

## 2019-07-07 NOTE — Discharge Instructions (Addendum)
Chest x-ray showed possible atypical pneumonia, no obvious pneumonia.  Thus I am sending you home on doxycycline in addition to the albuterol inhaler, prednisone.  2 puffs from your albuterol inhaler every 4-6 hours as needed for coughing, wheezing, shortness of breath.  Try the prednisone as well this will help with the inflammation in your lungs.  Covid PCR test will take 18 to 48 hours to come back.

## 2019-07-07 NOTE — ED Triage Notes (Signed)
Pt states his children tested positive for covid on the 5th of this month. States he started having symptoms on the 9th, cough and congestion and loss of taste. He was feeling better but no his chesto congestion and shortness of breath are worse.

## 2019-07-08 LAB — NOVEL CORONAVIRUS, NAA: SARS-CoV-2, NAA: NOT DETECTED

## 2019-07-16 ENCOUNTER — Ambulatory Visit (INDEPENDENT_AMBULATORY_CARE_PROVIDER_SITE_OTHER): Payer: Medicare Other

## 2019-07-16 ENCOUNTER — Other Ambulatory Visit: Payer: Self-pay | Admitting: Podiatry

## 2019-07-16 ENCOUNTER — Other Ambulatory Visit: Payer: Self-pay

## 2019-07-16 ENCOUNTER — Ambulatory Visit (INDEPENDENT_AMBULATORY_CARE_PROVIDER_SITE_OTHER): Payer: Medicare Other | Admitting: Podiatry

## 2019-07-16 DIAGNOSIS — M19079 Primary osteoarthritis, unspecified ankle and foot: Secondary | ICD-10-CM | POA: Diagnosis not present

## 2019-07-16 DIAGNOSIS — G5620 Lesion of ulnar nerve, unspecified upper limb: Secondary | ICD-10-CM | POA: Insufficient documentation

## 2019-07-16 DIAGNOSIS — M7752 Other enthesopathy of left foot: Secondary | ICD-10-CM

## 2019-07-16 DIAGNOSIS — M79671 Pain in right foot: Secondary | ICD-10-CM

## 2019-07-16 DIAGNOSIS — M7751 Other enthesopathy of right foot: Secondary | ICD-10-CM

## 2019-07-16 DIAGNOSIS — M79672 Pain in left foot: Secondary | ICD-10-CM

## 2019-07-16 DIAGNOSIS — M24173 Other articular cartilage disorders, unspecified ankle: Secondary | ICD-10-CM

## 2019-07-16 DIAGNOSIS — M24176 Other articular cartilage disorders, unspecified foot: Secondary | ICD-10-CM

## 2019-07-16 DIAGNOSIS — M249 Joint derangement, unspecified: Secondary | ICD-10-CM | POA: Diagnosis not present

## 2019-07-16 DIAGNOSIS — M19019 Primary osteoarthritis, unspecified shoulder: Secondary | ICD-10-CM | POA: Insufficient documentation

## 2019-07-16 DIAGNOSIS — M25519 Pain in unspecified shoulder: Secondary | ICD-10-CM | POA: Insufficient documentation

## 2019-07-16 NOTE — Progress Notes (Signed)
  Subjective:  Patient ID: Joseph [name], male    DOB: 01/25/1956,  MRN: [id number]  Chief Complaint  Patient presents with  . Pain    BL 1st MPJ (L>R) x 10 years; 7/10 sharp occasional pain -worse w/ major activities -been dx with arthritis  -pt dneies injury/swellgin -w/ occasional redness -sometimes looks bruised Tx: elevation, warm compress, diclofenac -worse with cold weather     64 y.o. male presents with the above complaint. History confirmed with patient.   Objective:  Physical Exam: warm, good capillary refill, no trophic changes or ulcerative lesions, normal DP and PT pulses and normal sensory exam. Bilateral feet: pain on palpation and ROM 1st MPJ with decreased ROM and bony end feel.  No images are attached to the encounter.  Radiographs: X-ray of both feet: dorsal exostosis, met primus elevatus, joint mouse right 1st metatasral, degenerative changes both 1st MPJs   Assessment:   1. Capsulitis of metatarsophalangeal (MTP) joints of both feet   2. Joint derangement of ankle or foot   3. Arthritis of big toe      Plan:  Patient was evaluated and treated and all questions answered.  Arthritis and Capsulitis -Educated on etiology -XR reviewed with patient -Injection delivered to the painful joint  Procedure: Joint Injection Location: Bilateral 1st MP joint Skin Prep: Alcohol. Injectate: 0.5 cc 1% lidocaine plain, 0.5 cc dexamethasone phosphate. Disposition: Patient tolerated procedure well. Injection site dressed with a band-aid.   Return in about 3 weeks (around 08/06/2019) for Capsulitis, Arthritis.

## 2019-07-17 ENCOUNTER — Other Ambulatory Visit: Payer: Self-pay

## 2019-07-18 ENCOUNTER — Other Ambulatory Visit: Payer: Self-pay | Admitting: Podiatry

## 2019-07-18 ENCOUNTER — Encounter: Payer: Self-pay | Admitting: Family Medicine

## 2019-07-18 ENCOUNTER — Other Ambulatory Visit: Payer: Self-pay

## 2019-07-18 ENCOUNTER — Ambulatory Visit (INDEPENDENT_AMBULATORY_CARE_PROVIDER_SITE_OTHER): Payer: Medicare Other | Admitting: Family Medicine

## 2019-07-18 VITALS — BP 132/86 | HR 57 | Temp 97.5°F | Ht 74.0 in | Wt 217.0 lb

## 2019-07-18 DIAGNOSIS — R42 Dizziness and giddiness: Secondary | ICD-10-CM | POA: Diagnosis not present

## 2019-07-18 DIAGNOSIS — E785 Hyperlipidemia, unspecified: Secondary | ICD-10-CM

## 2019-07-18 DIAGNOSIS — M7752 Other enthesopathy of left foot: Secondary | ICD-10-CM

## 2019-07-18 DIAGNOSIS — M7751 Other enthesopathy of right foot: Secondary | ICD-10-CM

## 2019-07-18 LAB — CBC
HCT: 41.5 % (ref 39.0–52.0)
Hemoglobin: 13.7 g/dL (ref 13.0–17.0)
MCHC: 33 g/dL (ref 30.0–36.0)
MCV: 90.2 fl (ref 78.0–100.0)
Platelets: 232 10*3/uL (ref 150.0–400.0)
RBC: 4.61 Mil/uL (ref 4.22–5.81)
RDW: 13.6 % (ref 11.5–15.5)
WBC: 6.7 10*3/uL (ref 4.0–10.5)

## 2019-07-18 LAB — COMPREHENSIVE METABOLIC PANEL
ALT: 20 U/L (ref 0–53)
AST: 16 U/L (ref 0–37)
Albumin: 4.1 g/dL (ref 3.5–5.2)
Alkaline Phosphatase: 88 U/L (ref 39–117)
BUN: 19 mg/dL (ref 6–23)
CO2: 29 mEq/L (ref 19–32)
Calcium: 9.1 mg/dL (ref 8.4–10.5)
Chloride: 107 mEq/L (ref 96–112)
Creatinine, Ser: 1.41 mg/dL (ref 0.40–1.50)
GFR: 50.44 mL/min — ABNORMAL LOW (ref >60.00)
Glucose, Bld: 83 mg/dL (ref 70–99)
Potassium: 3.7 mEq/L (ref 3.5–5.1)
Sodium: 143 mEq/L (ref 135–145)
Total Bilirubin: 0.4 mg/dL (ref 0.2–1.2)
Total Protein: 6.4 g/dL (ref 6.0–8.3)

## 2019-07-18 LAB — LIPID PANEL
Cholesterol: 145 mg/dL (ref 0–200)
HDL: 41 mg/dL (ref >39.00)
LDL Cholesterol: 76 mg/dL (ref 0–99)
NonHDL: 103.85
Total CHOL/HDL Ratio: 4
Triglycerides: 137 mg/dL (ref 0.0–149.0)
VLDL: 27.4 mg/dL (ref 0.0–40.0)

## 2019-07-18 MED ORDER — DICLOFENAC SODIUM 1.5 % EX SOLN: 2.0000 "application " | Freq: Three times a day (TID) | CUTANEOUS | 10 refills | Status: DC | PRN

## 2019-07-18 MED ORDER — ATORVASTATIN CALCIUM 40 MG PO TABS: 40.0000 mg | ORAL_TABLET | Freq: Every day | ORAL | 3 refills | Status: DC

## 2019-07-18 MED ORDER — PANTOPRAZOLE SODIUM 40 MG PO TBEC: 40.0000 mg | DELAYED_RELEASE_TABLET | Freq: Two times a day (BID) | ORAL | 3 refills | Status: DC

## 2019-07-18 NOTE — Progress Notes (Signed)
Chief Complaint  Patient presents with  . Follow-up    Before eating getting dizziness/lightheaded    Subjective: Hyperlipidemia  Patient presents for Hyperlipidemia follow up. Currently taking Lipitor 40 mg/d and compliance with treatment thus far has been good. He denies myalgias. He is adhering to a healthy diet. Exercise:  The patient is not known to have coexisting coronary artery disease.  GERD Hx of reflux, does better w Protonix bid. No AE's. No N/V, weight loss, bleeding.  Over past several weeks, has been getting lightheaded when he is hungry. Interestingly when he is physically active, he does not get. Eating makes it better. Drinks around 90 oz of water daily. No bleeding or wt changes.   ROS: Heart: Denies chest pain or palpitations Lungs: Denies SOB or cough  Past Medical History:  Diagnosis Date  . Arthritis   . GERD (gastroesophageal reflux disease)   . Heart murmur   . Mixed hyperlipidemia   . Prediabetes   . Rheumatic fever     Objective: BP 132/86 (BP Location: Left Arm, Patient Position: Sitting, Cuff Size: Normal)   Pulse (!) 57   Temp (!) 97.5 F (36.4 C) (Temporal)   Ht 6\' 2"  (1.88 m)   Wt 217 lb (98.4 kg)   SpO2 95%   BMI 27.86 kg/m  General: Awake, appears stated age HEENT: MMM Heart: RRR, no LE edema, no bruits Lungs: CTAB, no rales, wheezes or rhonchi. No accessory muscle use Psych: Age appropriate judgment and insight, normal affect and mood  Assessment and Plan: Hyperlipidemia, unspecified hyperlipidemia type - Plan: Lipid panel, Comprehensive metabolic panel  Episodic lightheadedness - Plan: CBC, Insul+Proins, C-peptide  Cont statin, ck labs. Counseled on diet and exercise. Ck hypoglycemia labs.  F/u in 6 mo for med ck and AWV. The patient voiced understanding and agreement to the plan.  Chand[place], DO 07/18/19  2:08 PM

## 2019-07-18 NOTE — Patient Instructions (Addendum)
Give [place] 4-5 business days to get the results of your labs back.   Stay hydrated. Try to get at least 60 oz of water daily.   Keep the diet clean and stay active.  Let us know if you need anything.

## 2019-07-19 LAB — C-PEPTIDE: C-Peptide: 4.45 ng/mL — ABNORMAL HIGH (ref 0.80–3.85)

## 2019-07-20 LAB — INSUL+PROINS: INSULIN: 16.7 u[IU]/mL (ref 2.6–24.9)

## 2019-07-23 ENCOUNTER — Other Ambulatory Visit: Payer: Self-pay | Admitting: Family Medicine

## 2019-07-23 DIAGNOSIS — R42 Dizziness and giddiness: Secondary | ICD-10-CM

## 2019-07-24 ENCOUNTER — Other Ambulatory Visit (INDEPENDENT_AMBULATORY_CARE_PROVIDER_SITE_OTHER): Payer: Medicare Other

## 2019-07-24 ENCOUNTER — Other Ambulatory Visit: Payer: Self-pay

## 2019-07-24 DIAGNOSIS — R42 Dizziness and giddiness: Secondary | ICD-10-CM

## 2019-07-29 LAB — PROINSULIN: Proinsulin: 80.7 pmol/L — ABNORMAL HIGH

## 2019-07-31 ENCOUNTER — Other Ambulatory Visit: Payer: Self-pay | Admitting: Family Medicine

## 2019-07-31 DIAGNOSIS — E161 Other hypoglycemia: Secondary | ICD-10-CM

## 2019-08-01 ENCOUNTER — Encounter: Payer: Self-pay | Admitting: Family Medicine

## 2019-08-03 ENCOUNTER — Other Ambulatory Visit: Payer: Self-pay | Admitting: Family Medicine

## 2019-08-03 DIAGNOSIS — E161 Other hypoglycemia: Secondary | ICD-10-CM

## 2019-08-04 ENCOUNTER — Ambulatory Visit (HOSPITAL_BASED_OUTPATIENT_CLINIC_OR_DEPARTMENT_OTHER)
Admission: RE | Admit: 2019-08-04 | Discharge: 2019-08-04 | Disposition: A | Discharge status: Home / Self Care | Payer: Medicare Other | Source: Ambulatory Visit | Attending: Family Medicine | Admitting: Family Medicine

## 2019-08-04 ENCOUNTER — Other Ambulatory Visit: Payer: Self-pay

## 2019-08-04 DIAGNOSIS — E161 Other hypoglycemia: Secondary | ICD-10-CM | POA: Insufficient documentation

## 2019-08-04 DIAGNOSIS — N281 Cyst of kidney, acquired: Secondary | ICD-10-CM | POA: Diagnosis not present

## 2019-08-04 MED ORDER — GADOBUTROL 1 MMOL/ML IV SOLN
10.0000 mL | Freq: Once | INTRAVENOUS | Status: AC | PRN
  Administered 2019-08-04: 10 mL via INTRAVENOUS

## 2019-08-06 ENCOUNTER — Ambulatory Visit: Payer: Medicare Other | Admitting: Podiatry

## 2019-08-09 ENCOUNTER — Ambulatory Visit: Payer: Medicare Other | Attending: Internal Medicine

## 2019-08-09 DIAGNOSIS — Z23 Encounter for immunization: Secondary | ICD-10-CM | POA: Insufficient documentation

## 2019-08-09 NOTE — Progress Notes (Signed)
   Covid-19 Vaccination Clinic  Name:  Joseph [name]    MRN: [id number] DOB: August 14, 1955  08/09/2019  [name] was observed post Covid-19 immunization for 15 minutes without incidence. He was provided with Vaccine Information Sheet and instruction to access the V-Safe system.   [name] was instructed to call 911 with any severe reactions post vaccine: Marland Kitchen Difficulty breathing  . Swelling of your face and throat  . A fast heartbeat  . A bad rash all over your body  . Dizziness and weakness    Immunizations Administered    Name Date Dose VIS Date Route   Pfizer COVID-19 Vaccine 08/09/2019  2:25 PM 0.3 mL 05/25/2019 Intramuscular   Manufacturer: [place]   Lot: [id number]   [place]: [id number]

## 2019-08-11 ENCOUNTER — Other Ambulatory Visit (HOSPITAL_BASED_OUTPATIENT_CLINIC_OR_DEPARTMENT_OTHER): Payer: Medicare Other

## 2019-08-14 ENCOUNTER — Encounter: Payer: Self-pay | Admitting: Family Medicine

## 2019-08-23 DIAGNOSIS — M4727 Other spondylosis with radiculopathy, lumbosacral region: Secondary | ICD-10-CM | POA: Diagnosis not present

## 2019-08-23 DIAGNOSIS — M5116 Intervertebral disc disorders with radiculopathy, lumbar region: Secondary | ICD-10-CM | POA: Diagnosis not present

## 2019-08-23 DIAGNOSIS — G544 Lumbosacral root disorders, not elsewhere classified: Secondary | ICD-10-CM | POA: Diagnosis not present

## 2019-08-23 DIAGNOSIS — D3610 Benign neoplasm of peripheral nerves and autonomic nervous system, unspecified: Secondary | ICD-10-CM | POA: Diagnosis not present

## 2019-08-23 DIAGNOSIS — M5126 Other intervertebral disc displacement, lumbar region: Secondary | ICD-10-CM | POA: Diagnosis not present

## 2019-08-23 DIAGNOSIS — M5127 Other intervertebral disc displacement, lumbosacral region: Secondary | ICD-10-CM | POA: Diagnosis not present

## 2019-08-23 DIAGNOSIS — M47816 Spondylosis without myelopathy or radiculopathy, lumbar region: Secondary | ICD-10-CM | POA: Diagnosis not present

## 2019-08-23 DIAGNOSIS — M4726 Other spondylosis with radiculopathy, lumbar region: Secondary | ICD-10-CM | POA: Diagnosis not present

## 2019-08-23 DIAGNOSIS — M47817 Spondylosis without myelopathy or radiculopathy, lumbosacral region: Secondary | ICD-10-CM | POA: Diagnosis not present

## 2019-08-23 DIAGNOSIS — M48061 Spinal stenosis, lumbar region without neurogenic claudication: Secondary | ICD-10-CM | POA: Diagnosis not present

## 2019-08-30 ENCOUNTER — Other Ambulatory Visit: Payer: Self-pay

## 2019-09-03 ENCOUNTER — Ambulatory Visit (INDEPENDENT_AMBULATORY_CARE_PROVIDER_SITE_OTHER): Payer: Medicare Other | Admitting: Internal Medicine

## 2019-09-03 ENCOUNTER — Encounter: Payer: Self-pay | Admitting: Internal Medicine

## 2019-09-03 ENCOUNTER — Other Ambulatory Visit: Payer: Self-pay

## 2019-09-03 VITALS — BP 142/92 | HR 74 | Temp 98.1°F | Ht 74.0 in | Wt 215.2 lb

## 2019-09-03 DIAGNOSIS — E8881 Metabolic syndrome: Secondary | ICD-10-CM | POA: Insufficient documentation

## 2019-09-03 LAB — POCT GLYCOSYLATED HEMOGLOBIN (HGB A1C): Hemoglobin A1C: 5.3 % (ref 4.0–5.6)

## 2019-09-03 NOTE — Patient Instructions (Signed)
-   Please get the ReliON meter from [facility] and approximately 50 strips - Check sugar every morning (fasting ) and when you don't feel well.  - Please contact us if you sugar is consistently below 60 mg/dL.

## 2019-09-03 NOTE — Progress Notes (Signed)
Name: Joseph [name]  MRN/ DOB: [id number], May 11, 1956    Age/ Sex: 64 y.o., male    PCP: [name], DO   Reason for Endocrinology Evaluation:  Insulin resistance     Date of Initial Endocrinology Evaluation: 09/03/2019     HPI: Mr. Joseph [name] is a 64 y.o. male with a past medical history of . The patient presented for initial endocrinology clinic visit on 09/03/2019 for consultative assistance with his insulin resistance  Patient presented to his PCP in 07/2019 with complaints of lightheadedness when he feels hungry, his symptoms are relieved by eating, this prompted a serum glucose check which came back normal at 80 mg/dL, with a concomitant serum insulin of 16.7 uIU/mL , and an elevated C-peptide of 4.45 ng/mL.  Of note this was a postprandial reading.  In review of his lightheadedness, patient states he has had the symptoms for the past 2 years, they occur every 2 hours and always relieved by eating.  There is no glucose check normal blood pressure check during these episodes.  He denies episodes of palpitations, feeling sweaty or clammy, or shaky.  Patient has been diagnosed with Covid infection in 06/2019.  He recently has noted vertigo which is described as spinning sensation BP during these times are normal.  He has noted feeling sweaty and clammy during the vertigo episodes only.  His symptoms of lightheadedness are improved with activity. No correlation with a certain meals   Snacks on Nutrigrain bars , beef sticks.   No GI surgery in the past  Brother with DM  No access to DM medications    HISTORY:  Past Medical History:  Past Medical History:  Diagnosis Date   Arthritis    GERD (gastroesophageal reflux disease)    Heart murmur    Mixed hyperlipidemia    Prediabetes    Rheumatic fever    Past Surgical History:  Past Surgical History:  Procedure Laterality Date   BACK SURGERY  2012   JOINT REPLACEMENT Right 2013   [place]   C4-5, C5-C6    TONSILLECTOMY  1963      Social History:  reports that he quit smoking about 29 years ago. His smoking use included cigarettes. He has a 30.00 pack-year smoking history. He has never used smokeless tobacco. He reports that he does not drink alcohol or use drugs.  Family History: family history includes Arthritis in his father and mother; Diabetes in his brother and mother; Hypertension in his father; Lupus in his brother.   HOME MEDICATIONS: Allergies as of 09/03/2019      Reactions   Hydrocodone-acetaminophen Nausea Only, Nausea And Vomiting   Other    Intolerance to narcotic medication - causes stomach upset and dizziness and does not relieve pain.       Medication List       Accurate as of September 03, 2019  7:55 AM. If you have any questions, ask your nurse or doctor.        atorvastatin 40 MG tablet Commonly known as: LIPITOR Take 1 tablet (40 mg total) by mouth daily.   Diclofenac Sodium 1.5 % Soln Apply 2 application topically 3 (three) times daily as needed (Pain).   lidocaine 5 % Commonly known as: LIDODERM Place 1 patch onto the skin daily. Remove & Discard patch within 12 hours or as directed by MD   pantoprazole 40 MG tablet Commonly known as: PROTONIX Take 1 tablet (40 mg total) by mouth  2 (two) times daily.         REVIEW OF SYSTEMS: A comprehensive ROS was conducted with the patient and is negative except as per HPI and below:  ROS     OBJECTIVE:  VS: BP (!) 142/92 (BP Location: Left Arm, Patient Position: Sitting, Cuff Size: Large)    Pulse 74    Temp 98.1 F (36.7 C)    Ht 6\' 2"  (1.88 m)    Wt 215 lb 3.2 oz (97.6 kg)    SpO2 98%    BMI 27.63 kg/m    Wt Readings from Last 3 Encounters:  09/03/19 215 lb 3.2 oz (97.6 kg)  07/18/19 217 lb (98.4 kg)  07/07/19 220 lb (99.8 kg)     EXAM: General: Pt appears well and is in NAD  Neck: General: Supple without adenopathy. Thyroid: Thyroid size normal.  No goiter or nodules  appreciated. No thyroid bruit.  Lungs: Clear with good BS bilat with no rales, rhonchi, or wheezes  Heart: Auscultation: RRR.  Abdomen: Normoactive bowel sounds, soft, nontender, without masses or organomegaly palpable  Extremities:  BL LE: No pretibial edema normal ROM and strength.  Skin: Hair: Texture and amount normal with gender appropriate distribution Skin Inspection: No rashes Skin Palpation: Skin temperature, texture, and thickness normal to palpation  Neuro: Cranial nerves: II - XII grossly intact  Motor: Normal strength throughout DTRs: 2+ and symmetric in UE without delay in relaxation phase  Mental Status: Judgment, insight: Intact Orientation: Oriented to time, place, and person Mood and affect: No depression, anxiety, or agitation     DATA REVIEWED: Results for JESTON, STATHIS (MRN [id number]) as of 09/03/2019 13:49  Ref. Range 07/18/2019 13:33 07/24/2019 14:01 09/03/2019 07:49  Sodium Latest Ref Range: 135 - 145 mEq/L 143    Potassium Latest Ref Range: 3.5 - 5.1 mEq/L 3.7    Chloride Latest Ref Range: 96 - 112 mEq/L 107    CO2 Latest Ref Range: 19 - 32 mEq/L 29    Glucose Latest Ref Range: 70 - 99 mg/dL 83    BUN Latest Ref Range: 6 - 23 mg/dL 19    Creatinine Latest Ref Range: 0.40 - 1.50 mg/dL 1.41    Calcium Latest Ref Range: 8.4 - 10.5 mg/dL 9.1    Alkaline Phosphatase Latest Ref Range: 39 - 117 U/L 88    Albumin Latest Ref Range: 3.5 - 5.2 g/dL 4.1    AST Latest Ref Range: 0 - 37 U/L 16    ALT Latest Ref Range: 0 - 53 U/L 20    Total Protein Latest Ref Range: 6.0 - 8.3 g/dL 6.4    Total Bilirubin Latest Ref Range: 0.2 - 1.2 mg/dL 0.4    GFR Latest Ref Range: >60.00 mL/min 50.44 (L)    Total CHOL/HDL Ratio Unknown 4    Cholesterol Latest Ref Range: 0 - 200 mg/dL 145    HDL Cholesterol Latest Ref Range: >39.00 mg/dL 41.00    LDL (calc) Latest Ref Range: 0 - 99 mg/dL 76    NonHDL Unknown 103.85    Triglycerides Latest Ref Range: 0.0 - 149.0 mg/dL 137.0    VLDL  Latest Ref Range: 0.0 - 40.0 mg/dL 27.4    WBC Latest Ref Range: 4.0 - 10.5 K/uL 6.7    RBC Latest Ref Range: 4.22 - 5.81 Mil/uL 4.61    Hemoglobin Latest Ref Range: 13.0 - 17.0 g/dL 13.7    HCT Latest Ref Range: 39.0 - 52.0 % 41.5  MCV Latest Ref Range: 78.0 - 100.0 fl 90.2    MCHC Latest Ref Range: 30.0 - 36.0 g/dL 33.0    RDW Latest Ref Range: 11.5 - 15.5 % 13.6    Platelets Latest Ref Range: 150.0 - 400.0 K/uL 232.0    Hemoglobin A1C Latest Ref Range: 4.0 - 5.6 %   5.3  INSULIN Latest Ref Range: 2.6 - 24.9 uIU/mL 16.7    Proinsulin Latest Ref Range: < OR = 18. pmol/L CANCELED 80.7 (H)   C-Peptide Latest Ref Range: 0.80 - 3.85 ng/mL 4.45 (H)      ASSESSMENT/PLAN/RECOMMENDATIONS:   1. Lightheadedness:   - This is a non-specific symptom,the differential diagnosis is broad, I have advised him to check his glucose and BP during these episodes.  - I do not think he is having hypoglycemic episodes ( that's the only way elevated insulin could cause any symptoms) but we wouldn't know without glucose checks during symptoms. - He was advised to get a ReliOn meter from [place] and start checking glucose fasting and when symptomatic .  -Part of his elevated insulin/C-peptide is that this was a postprandial reading , we also discussed insulin resistance,  which is another likely cause for his elevated C-peptide given a BMI over 25.    Follow-up in 8 weeks    Signed electronically by: Mack Guise, MD  [facility] Endocrinology  [facility] [address] [place], [place] [address] Phone: [phone] FAX: [phone]   CC: [name], [address] [place] [address] Phone: [phone] Fax: [phone]   Return to Endocrinology clinic as below: Future Appointments  Date Time Provider [place]  09/05/2019 10:00 AM [place]

## 2019-09-05 ENCOUNTER — Ambulatory Visit: Payer: Medicare Other

## 2019-09-10 ENCOUNTER — Other Ambulatory Visit: Payer: Self-pay

## 2019-09-10 ENCOUNTER — Ambulatory Visit (INDEPENDENT_AMBULATORY_CARE_PROVIDER_SITE_OTHER): Payer: Medicare Other | Admitting: Podiatry

## 2019-09-10 DIAGNOSIS — M7752 Other enthesopathy of left foot: Secondary | ICD-10-CM

## 2019-09-10 DIAGNOSIS — M7751 Other enthesopathy of right foot: Secondary | ICD-10-CM | POA: Diagnosis not present

## 2019-09-10 NOTE — Progress Notes (Signed)
  Subjective:  Patient ID: Joseph [name], male    DOB: 09/14/1955,  MRN: [id number]  Chief Complaint  Patient presents with  . capsulitis    F/U BL arthritis and capsulitis Pt. states," the Rt one is better, but the Lt one is still about the same; at times 8/10." -pt denies swellgin tx: none    64 y.o. male presents with the above complaint. History confirmed with patient.   Objective:  Physical Exam: warm, good capillary refill, no trophic changes or ulcerative lesions, normal DP and PT pulses and normal sensory exam. Left foot pain on palpation and ROM 1st MPJ with decreased ROM and bony end feel. No pain R 1st MPJ  No images are attached to the encounter.  Radiographs: 2/1: X-ray of both feet: dorsal exostosis, met primus elevatus, joint mouse right 1st metatasral, degenerative changes both 1st MPJs   Assessment:   1. Capsulitis of metatarsophalangeal (MTP) joints of both feet    Plan:  Patient was evaluated and treated and all questions answered.  Arthritis and Capsulitis -Repeat injection left 1st MPJ  Procedure: Joint Injection Location: Left 1st MP joint Skin Prep: Alcohol. Injectate: 0.5 cc 1% lidocaine plain, 0.5 cc dexamethasone phosphate. Disposition: Patient tolerated procedure well. Injection site dressed with a band-aid.  Return in about 3 weeks (around 10/01/2019) for Arthritis, Left.

## 2019-09-20 ENCOUNTER — Encounter: Payer: Self-pay | Admitting: Podiatry

## 2019-10-29 ENCOUNTER — Ambulatory Visit: Payer: Medicare Other | Admitting: Internal Medicine

## 2019-12-24 ENCOUNTER — Other Ambulatory Visit: Payer: Self-pay | Admitting: Family Medicine

## 2019-12-24 DIAGNOSIS — Z96619 Presence of unspecified artificial shoulder joint: Secondary | ICD-10-CM

## 2019-12-24 NOTE — Progress Notes (Signed)
m °

## 2020-01-03 ENCOUNTER — Ambulatory Visit (INDEPENDENT_AMBULATORY_CARE_PROVIDER_SITE_OTHER): Payer: Medicare Other | Admitting: Orthopedic Surgery

## 2020-01-03 ENCOUNTER — Ambulatory Visit (INDEPENDENT_AMBULATORY_CARE_PROVIDER_SITE_OTHER): Payer: Medicare Other

## 2020-01-03 DIAGNOSIS — M25511 Pain in right shoulder: Secondary | ICD-10-CM

## 2020-01-03 DIAGNOSIS — Z96611 Presence of right artificial shoulder joint: Secondary | ICD-10-CM | POA: Diagnosis not present

## 2020-01-05 ENCOUNTER — Encounter: Payer: Self-pay | Admitting: Orthopedic Surgery

## 2020-01-05 NOTE — Progress Notes (Signed)
Office Visit Note   Patient: Joseph [name]           Date of Birth: 04/12/1956           MRN: [id number] Visit Date: 01/03/2020 Requested by: [name], [address],  [place] [address] PCP: [name], DO  Subjective: Chief Complaint  Patient presents with  . Right Shoulder - Pain    HPI: Joseph [name] is a 64 y.o. male who presents to the office complaining of right shoulder pain.  Patient has history of right shoulder replacement 2013 by Dr. In [place].  He has been doing well with this until 4 to 5 months ago when he began having pain in his right shoulder.  Localizes pain to the posterior lateral aspect of the shoulder.  Denies any weakness.  Denies injury waking with pain.  No injuries.  No fevers, chills, drainage.  Does note some stiffness as well.  Is taking Advil and Tylenol with some relief.  He notes swimming makes his pain worse at times.  Denies any history of diabetes.  He states that he has had friends with joint replacements and because of this he just wants to make sure that the shoulder is not infected..                ROS: All systems reviewed are negative as they relate to the chief complaint within the history of present illness.  Patient denies fevers or chills.  Assessment & Plan: Visit Diagnoses:  1. Right shoulder pain, unspecified chronicity   2. History of right shoulder replacement     Plan: Patient is a 64 year old male who presents about 8 years out from right shoulder replacement.  This was done at an outside facility.  He has been doing well until the last 4 to 5 months when pain began.  He also did stiffness.  No symptoms of infection.  Excellent range of motion and strength on exam today.  He does have some pain with infraspinatus resistance testing.  Radiographs show nonspecific findings consistent with 65 year old prosthesis..  In general the patient is doing well and the concern for infection is  low at this time.  Discussed with him red flag symptoms which would warrant earlier evaluation.  In general Donelle was concerned about infection because a friend of his recently did have a total joint infection.  His clinical and historic findings do not really support that at this time but we do want to see him back in 8 months for clinical recheck.Recommend the patient return to the office in 8 months for clinical recheck with x-rays.  He may return sooner if his symptoms worsen significantly.  Patient agreed this plan.  Follow-Up Instructions: No follow-ups on file.   Orders:  Orders Placed This Encounter  Procedures  . XR Shoulder Right   No orders of the defined types were placed in this encounter.     Procedures: No procedures performed   Clinical Data: No additional findings.  Objective: Vital Signs: There were no vitals taken for this visit.  Physical Exam:  Constitutional: Patient appears well-developed HEENT:  Head: Normocephalic Eyes:EOM are normal Neck: Normal range of motion Cardiovascular: Normal rate Pulmonary/chest: Effort normal Neurologic: Patient is alert Skin: Skin is warm Psychiatric: Patient has normal mood and affect  Ortho Exam: Orthopedic exam demonstrates right shoulder with 25 degrees external rotation, 95 degrees abduction, 110 degrees forward flexion.  No significant pain with passive  range of motion.  No erythema overlying the incision.  No evidence of dehiscence or drainage coming from the incision.  No warmth over the right shoulder.  Excellent strength of the rotator cuff with mild to moderate pain elicited by infraspinatus resistance testing.  Specialty Comments:  No specialty comments available.  Imaging: No results found.   PMFS History: Patient Active Problem List   Diagnosis Date Noted  . Insulin resistance 09/03/2019  . Cubital tunnel syndrome 07/16/2019  . Localized, primary osteoarthritis of shoulder region 07/16/2019  .  Shoulder pain 07/16/2019  . Low testosterone 04/13/2018  . Mixed hyperlipidemia   . Bilateral sensorineural hearing loss 11/01/2017  . Subjective tinnitus of right ear 11/01/2017  . Hyperlipidemia 09/14/2017  . History of tobacco abuse 09/14/2017  . Overweight (BMI 25.0-29.9) 09/14/2017  . Vertigo 07/14/2016  . Arthritis of hand 02/09/2016  . Right hand pain 02/09/2016  . Cervical spondylosis with myelopathy 01/15/2016  . Lumbar facet arthropathy 12/12/2015  . Lumbar stenosis 12/12/2015  . Meralgia paresthetica 12/12/2015  . Neurofibroma 12/12/2015  . Other intervertebral disc degeneration, lumbar region 12/12/2015  . Dysphagia 10/31/2015  . GERD (gastroesophageal reflux disease) 10/31/2015  . Neuropathy 10/21/2015  . Essential hypertension 02/11/2015  . Shingles 07/10/2014  . Low back pain 05/01/2014  . Kidney stone 05/01/2014   Past Medical History:  Diagnosis Date  . Arthritis   . GERD (gastroesophageal reflux disease)   . Heart murmur   . Mixed hyperlipidemia   . Prediabetes   . Rheumatic fever     Family History  Problem Relation Age of Onset  . Arthritis Mother        Rheumatoid  . Diabetes Mother   . Arthritis Father   . Hypertension Father   . Diabetes Brother   . Lupus Brother     Past Surgical History:  Procedure Laterality Date  . BACK SURGERY  2012  . JOINT REPLACEMENT Right 2013  . SPINE SURGERY  1995   C4-5, C5-C6   . TONSILLECTOMY  1963   Social History   Occupational History  . Occupation: Disability    Comment: Shoulder / Back  Tobacco Use  . Smoking status: Former Smoker    Packs/day: 1.50    Years: 20.00    Pack years: 30.00    Types: Cigarettes    Quit date: 05/01/1990    Years since quitting: 29.7  . Smokeless tobacco: Never Used  Substance and Sexual Activity  . Alcohol use: No    Alcohol/week: 0.0 standard drinks  . Drug use: No  . Sexual activity: Yes    Partners: Female

## 2020-05-07 DIAGNOSIS — M9901 Segmental and somatic dysfunction of cervical region: Secondary | ICD-10-CM | POA: Diagnosis not present

## 2020-05-07 DIAGNOSIS — M546 Pain in thoracic spine: Secondary | ICD-10-CM | POA: Diagnosis not present

## 2020-05-07 DIAGNOSIS — M9903 Segmental and somatic dysfunction of lumbar region: Secondary | ICD-10-CM | POA: Diagnosis not present

## 2020-05-07 DIAGNOSIS — M5136 Other intervertebral disc degeneration, lumbar region: Secondary | ICD-10-CM | POA: Diagnosis not present

## 2020-05-07 DIAGNOSIS — M9902 Segmental and somatic dysfunction of thoracic region: Secondary | ICD-10-CM | POA: Diagnosis not present

## 2020-05-07 DIAGNOSIS — M50323 Other cervical disc degeneration at C6-C7 level: Secondary | ICD-10-CM | POA: Diagnosis not present

## 2020-05-07 DIAGNOSIS — M62830 Muscle spasm of back: Secondary | ICD-10-CM | POA: Diagnosis not present

## 2020-05-07 DIAGNOSIS — M5413 Radiculopathy, cervicothoracic region: Secondary | ICD-10-CM | POA: Diagnosis not present

## 2020-05-12 DIAGNOSIS — M9901 Segmental and somatic dysfunction of cervical region: Secondary | ICD-10-CM | POA: Diagnosis not present

## 2020-05-12 DIAGNOSIS — M9903 Segmental and somatic dysfunction of lumbar region: Secondary | ICD-10-CM | POA: Diagnosis not present

## 2020-05-12 DIAGNOSIS — M50323 Other cervical disc degeneration at C6-C7 level: Secondary | ICD-10-CM | POA: Diagnosis not present

## 2020-05-12 DIAGNOSIS — M5136 Other intervertebral disc degeneration, lumbar region: Secondary | ICD-10-CM | POA: Diagnosis not present

## 2020-05-12 DIAGNOSIS — M546 Pain in thoracic spine: Secondary | ICD-10-CM | POA: Diagnosis not present

## 2020-05-12 DIAGNOSIS — M5413 Radiculopathy, cervicothoracic region: Secondary | ICD-10-CM | POA: Diagnosis not present

## 2020-05-12 DIAGNOSIS — M9902 Segmental and somatic dysfunction of thoracic region: Secondary | ICD-10-CM | POA: Diagnosis not present

## 2020-05-12 DIAGNOSIS — M62830 Muscle spasm of back: Secondary | ICD-10-CM | POA: Diagnosis not present

## 2020-05-14 DIAGNOSIS — M5413 Radiculopathy, cervicothoracic region: Secondary | ICD-10-CM | POA: Diagnosis not present

## 2020-05-14 DIAGNOSIS — M62830 Muscle spasm of back: Secondary | ICD-10-CM | POA: Diagnosis not present

## 2020-05-14 DIAGNOSIS — M5136 Other intervertebral disc degeneration, lumbar region: Secondary | ICD-10-CM | POA: Diagnosis not present

## 2020-05-14 DIAGNOSIS — M546 Pain in thoracic spine: Secondary | ICD-10-CM | POA: Diagnosis not present

## 2020-05-14 DIAGNOSIS — M9901 Segmental and somatic dysfunction of cervical region: Secondary | ICD-10-CM | POA: Diagnosis not present

## 2020-05-14 DIAGNOSIS — M9903 Segmental and somatic dysfunction of lumbar region: Secondary | ICD-10-CM | POA: Diagnosis not present

## 2020-05-14 DIAGNOSIS — M9902 Segmental and somatic dysfunction of thoracic region: Secondary | ICD-10-CM | POA: Diagnosis not present

## 2020-05-14 DIAGNOSIS — M50323 Other cervical disc degeneration at C6-C7 level: Secondary | ICD-10-CM | POA: Diagnosis not present

## 2020-05-16 DIAGNOSIS — M62830 Muscle spasm of back: Secondary | ICD-10-CM | POA: Diagnosis not present

## 2020-05-16 DIAGNOSIS — M50323 Other cervical disc degeneration at C6-C7 level: Secondary | ICD-10-CM | POA: Diagnosis not present

## 2020-05-16 DIAGNOSIS — M546 Pain in thoracic spine: Secondary | ICD-10-CM | POA: Diagnosis not present

## 2020-05-16 DIAGNOSIS — M5136 Other intervertebral disc degeneration, lumbar region: Secondary | ICD-10-CM | POA: Diagnosis not present

## 2020-05-16 DIAGNOSIS — M9901 Segmental and somatic dysfunction of cervical region: Secondary | ICD-10-CM | POA: Diagnosis not present

## 2020-05-16 DIAGNOSIS — M9903 Segmental and somatic dysfunction of lumbar region: Secondary | ICD-10-CM | POA: Diagnosis not present

## 2020-05-16 DIAGNOSIS — M5413 Radiculopathy, cervicothoracic region: Secondary | ICD-10-CM | POA: Diagnosis not present

## 2020-05-16 DIAGNOSIS — M9902 Segmental and somatic dysfunction of thoracic region: Secondary | ICD-10-CM | POA: Diagnosis not present

## 2020-05-19 DIAGNOSIS — M62830 Muscle spasm of back: Secondary | ICD-10-CM | POA: Diagnosis not present

## 2020-05-19 DIAGNOSIS — M5413 Radiculopathy, cervicothoracic region: Secondary | ICD-10-CM | POA: Diagnosis not present

## 2020-05-19 DIAGNOSIS — M50323 Other cervical disc degeneration at C6-C7 level: Secondary | ICD-10-CM | POA: Diagnosis not present

## 2020-05-19 DIAGNOSIS — M5136 Other intervertebral disc degeneration, lumbar region: Secondary | ICD-10-CM | POA: Diagnosis not present

## 2020-05-19 DIAGNOSIS — M9903 Segmental and somatic dysfunction of lumbar region: Secondary | ICD-10-CM | POA: Diagnosis not present

## 2020-05-19 DIAGNOSIS — M546 Pain in thoracic spine: Secondary | ICD-10-CM | POA: Diagnosis not present

## 2020-05-19 DIAGNOSIS — M9902 Segmental and somatic dysfunction of thoracic region: Secondary | ICD-10-CM | POA: Diagnosis not present

## 2020-05-19 DIAGNOSIS — M9901 Segmental and somatic dysfunction of cervical region: Secondary | ICD-10-CM | POA: Diagnosis not present

## 2020-05-21 DIAGNOSIS — M5136 Other intervertebral disc degeneration, lumbar region: Secondary | ICD-10-CM | POA: Diagnosis not present

## 2020-05-21 DIAGNOSIS — M546 Pain in thoracic spine: Secondary | ICD-10-CM | POA: Diagnosis not present

## 2020-05-21 DIAGNOSIS — M5413 Radiculopathy, cervicothoracic region: Secondary | ICD-10-CM | POA: Diagnosis not present

## 2020-05-21 DIAGNOSIS — M9903 Segmental and somatic dysfunction of lumbar region: Secondary | ICD-10-CM | POA: Diagnosis not present

## 2020-05-21 DIAGNOSIS — M9902 Segmental and somatic dysfunction of thoracic region: Secondary | ICD-10-CM | POA: Diagnosis not present

## 2020-05-21 DIAGNOSIS — M50323 Other cervical disc degeneration at C6-C7 level: Secondary | ICD-10-CM | POA: Diagnosis not present

## 2020-05-21 DIAGNOSIS — M9901 Segmental and somatic dysfunction of cervical region: Secondary | ICD-10-CM | POA: Diagnosis not present

## 2020-05-21 DIAGNOSIS — M62830 Muscle spasm of back: Secondary | ICD-10-CM | POA: Diagnosis not present

## 2020-05-23 DIAGNOSIS — M546 Pain in thoracic spine: Secondary | ICD-10-CM | POA: Diagnosis not present

## 2020-05-23 DIAGNOSIS — M62830 Muscle spasm of back: Secondary | ICD-10-CM | POA: Diagnosis not present

## 2020-05-23 DIAGNOSIS — M5413 Radiculopathy, cervicothoracic region: Secondary | ICD-10-CM | POA: Diagnosis not present

## 2020-05-23 DIAGNOSIS — M50323 Other cervical disc degeneration at C6-C7 level: Secondary | ICD-10-CM | POA: Diagnosis not present

## 2020-05-23 DIAGNOSIS — M5136 Other intervertebral disc degeneration, lumbar region: Secondary | ICD-10-CM | POA: Diagnosis not present

## 2020-05-23 DIAGNOSIS — M9903 Segmental and somatic dysfunction of lumbar region: Secondary | ICD-10-CM | POA: Diagnosis not present

## 2020-05-23 DIAGNOSIS — M9901 Segmental and somatic dysfunction of cervical region: Secondary | ICD-10-CM | POA: Diagnosis not present

## 2020-05-23 DIAGNOSIS — M9902 Segmental and somatic dysfunction of thoracic region: Secondary | ICD-10-CM | POA: Diagnosis not present

## 2020-05-26 DIAGNOSIS — M9902 Segmental and somatic dysfunction of thoracic region: Secondary | ICD-10-CM | POA: Diagnosis not present

## 2020-05-26 DIAGNOSIS — M50323 Other cervical disc degeneration at C6-C7 level: Secondary | ICD-10-CM | POA: Diagnosis not present

## 2020-05-26 DIAGNOSIS — M62830 Muscle spasm of back: Secondary | ICD-10-CM | POA: Diagnosis not present

## 2020-05-26 DIAGNOSIS — M5136 Other intervertebral disc degeneration, lumbar region: Secondary | ICD-10-CM | POA: Diagnosis not present

## 2020-05-26 DIAGNOSIS — M5413 Radiculopathy, cervicothoracic region: Secondary | ICD-10-CM | POA: Diagnosis not present

## 2020-05-26 DIAGNOSIS — M9903 Segmental and somatic dysfunction of lumbar region: Secondary | ICD-10-CM | POA: Diagnosis not present

## 2020-05-26 DIAGNOSIS — M546 Pain in thoracic spine: Secondary | ICD-10-CM | POA: Diagnosis not present

## 2020-05-26 DIAGNOSIS — M9901 Segmental and somatic dysfunction of cervical region: Secondary | ICD-10-CM | POA: Diagnosis not present

## 2020-05-28 DIAGNOSIS — M9901 Segmental and somatic dysfunction of cervical region: Secondary | ICD-10-CM | POA: Diagnosis not present

## 2020-05-28 DIAGNOSIS — M5136 Other intervertebral disc degeneration, lumbar region: Secondary | ICD-10-CM | POA: Diagnosis not present

## 2020-05-28 DIAGNOSIS — M546 Pain in thoracic spine: Secondary | ICD-10-CM | POA: Diagnosis not present

## 2020-05-28 DIAGNOSIS — M62830 Muscle spasm of back: Secondary | ICD-10-CM | POA: Diagnosis not present

## 2020-05-28 DIAGNOSIS — M5413 Radiculopathy, cervicothoracic region: Secondary | ICD-10-CM | POA: Diagnosis not present

## 2020-05-28 DIAGNOSIS — M50323 Other cervical disc degeneration at C6-C7 level: Secondary | ICD-10-CM | POA: Diagnosis not present

## 2020-05-28 DIAGNOSIS — M9902 Segmental and somatic dysfunction of thoracic region: Secondary | ICD-10-CM | POA: Diagnosis not present

## 2020-05-28 DIAGNOSIS — M9903 Segmental and somatic dysfunction of lumbar region: Secondary | ICD-10-CM | POA: Diagnosis not present

## 2020-05-30 DIAGNOSIS — M50323 Other cervical disc degeneration at C6-C7 level: Secondary | ICD-10-CM | POA: Diagnosis not present

## 2020-05-30 DIAGNOSIS — M546 Pain in thoracic spine: Secondary | ICD-10-CM | POA: Diagnosis not present

## 2020-05-30 DIAGNOSIS — M5136 Other intervertebral disc degeneration, lumbar region: Secondary | ICD-10-CM | POA: Diagnosis not present

## 2020-05-30 DIAGNOSIS — M9903 Segmental and somatic dysfunction of lumbar region: Secondary | ICD-10-CM | POA: Diagnosis not present

## 2020-05-30 DIAGNOSIS — M5413 Radiculopathy, cervicothoracic region: Secondary | ICD-10-CM | POA: Diagnosis not present

## 2020-05-30 DIAGNOSIS — M62830 Muscle spasm of back: Secondary | ICD-10-CM | POA: Diagnosis not present

## 2020-05-30 DIAGNOSIS — M9902 Segmental and somatic dysfunction of thoracic region: Secondary | ICD-10-CM | POA: Diagnosis not present

## 2020-05-30 DIAGNOSIS — M9901 Segmental and somatic dysfunction of cervical region: Secondary | ICD-10-CM | POA: Diagnosis not present

## 2020-06-22 ENCOUNTER — Other Ambulatory Visit: Payer: Self-pay | Admitting: Family Medicine

## 2020-06-25 ENCOUNTER — Other Ambulatory Visit: Payer: Self-pay | Admitting: Family Medicine

## 2020-08-15 DIAGNOSIS — T1501XA Foreign body in cornea, right eye, initial encounter: Secondary | ICD-10-CM | POA: Diagnosis not present

## 2020-09-18 ENCOUNTER — Other Ambulatory Visit: Payer: Self-pay | Admitting: Family Medicine

## 2020-10-06 DIAGNOSIS — M9903 Segmental and somatic dysfunction of lumbar region: Secondary | ICD-10-CM | POA: Diagnosis not present

## 2020-10-06 DIAGNOSIS — M546 Pain in thoracic spine: Secondary | ICD-10-CM | POA: Diagnosis not present

## 2020-10-06 DIAGNOSIS — M9901 Segmental and somatic dysfunction of cervical region: Secondary | ICD-10-CM | POA: Diagnosis not present

## 2020-10-06 DIAGNOSIS — M62830 Muscle spasm of back: Secondary | ICD-10-CM | POA: Diagnosis not present

## 2020-10-06 DIAGNOSIS — M5136 Other intervertebral disc degeneration, lumbar region: Secondary | ICD-10-CM | POA: Diagnosis not present

## 2020-10-06 DIAGNOSIS — M9902 Segmental and somatic dysfunction of thoracic region: Secondary | ICD-10-CM | POA: Diagnosis not present

## 2020-10-06 DIAGNOSIS — M5413 Radiculopathy, cervicothoracic region: Secondary | ICD-10-CM | POA: Diagnosis not present

## 2020-10-06 DIAGNOSIS — M50323 Other cervical disc degeneration at C6-C7 level: Secondary | ICD-10-CM | POA: Diagnosis not present

## 2020-10-07 DIAGNOSIS — M5136 Other intervertebral disc degeneration, lumbar region: Secondary | ICD-10-CM | POA: Diagnosis not present

## 2020-10-07 DIAGNOSIS — M9901 Segmental and somatic dysfunction of cervical region: Secondary | ICD-10-CM | POA: Diagnosis not present

## 2020-10-07 DIAGNOSIS — M5413 Radiculopathy, cervicothoracic region: Secondary | ICD-10-CM | POA: Diagnosis not present

## 2020-10-07 DIAGNOSIS — M62830 Muscle spasm of back: Secondary | ICD-10-CM | POA: Diagnosis not present

## 2020-10-07 DIAGNOSIS — M50323 Other cervical disc degeneration at C6-C7 level: Secondary | ICD-10-CM | POA: Diagnosis not present

## 2020-10-07 DIAGNOSIS — M9903 Segmental and somatic dysfunction of lumbar region: Secondary | ICD-10-CM | POA: Diagnosis not present

## 2020-10-07 DIAGNOSIS — M9902 Segmental and somatic dysfunction of thoracic region: Secondary | ICD-10-CM | POA: Diagnosis not present

## 2020-10-07 DIAGNOSIS — M546 Pain in thoracic spine: Secondary | ICD-10-CM | POA: Diagnosis not present

## 2020-10-08 DIAGNOSIS — M62830 Muscle spasm of back: Secondary | ICD-10-CM | POA: Diagnosis not present

## 2020-10-08 DIAGNOSIS — M9901 Segmental and somatic dysfunction of cervical region: Secondary | ICD-10-CM | POA: Diagnosis not present

## 2020-10-08 DIAGNOSIS — M5413 Radiculopathy, cervicothoracic region: Secondary | ICD-10-CM | POA: Diagnosis not present

## 2020-10-08 DIAGNOSIS — M5136 Other intervertebral disc degeneration, lumbar region: Secondary | ICD-10-CM | POA: Diagnosis not present

## 2020-10-08 DIAGNOSIS — M50323 Other cervical disc degeneration at C6-C7 level: Secondary | ICD-10-CM | POA: Diagnosis not present

## 2020-10-08 DIAGNOSIS — M9902 Segmental and somatic dysfunction of thoracic region: Secondary | ICD-10-CM | POA: Diagnosis not present

## 2020-10-08 DIAGNOSIS — M9903 Segmental and somatic dysfunction of lumbar region: Secondary | ICD-10-CM | POA: Diagnosis not present

## 2020-10-08 DIAGNOSIS — M546 Pain in thoracic spine: Secondary | ICD-10-CM | POA: Diagnosis not present

## 2020-10-14 ENCOUNTER — Ambulatory Visit (INDEPENDENT_AMBULATORY_CARE_PROVIDER_SITE_OTHER): Payer: Medicare Other | Admitting: Family Medicine

## 2020-10-14 ENCOUNTER — Encounter: Payer: Self-pay | Admitting: Family Medicine

## 2020-10-14 ENCOUNTER — Other Ambulatory Visit: Payer: Self-pay

## 2020-10-14 VITALS — BP 142/86 | HR 68 | Temp 98.5°F | Ht 74.0 in | Wt 210.4 lb

## 2020-10-14 DIAGNOSIS — M545 Low back pain, unspecified: Secondary | ICD-10-CM

## 2020-10-14 MED ORDER — MELOXICAM 7.5 MG PO TABS: 7.5000 mg | ORAL_TABLET | Freq: Every day | ORAL | 0 refills | Status: DC

## 2020-10-14 NOTE — Progress Notes (Signed)
Musculoskeletal Exam  Patient: Joseph [name] DOB: 1956-03-18  DOS: 10/14/2020  SUBJECTIVE:  Chief Complaint:   Chief Complaint  Patient presents with  . Back Pain   Joseph [name] is a 65 y.o.  male for evaluation and treatment of back pain.   Onset:  8 days ago.  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   Location: lower L Character:  aching and sharp  Progression of issue:  has slightly improved Associated symptoms: no bruising, redness, or swelling Denies bowel/bladder incontinence or weakness Treatment: to date has been ice and Voltaren gel, heat.   Neurovascular symptoms: no  Past Medical History:  Diagnosis Date  . Arthritis   . GERD (gastroesophageal reflux disease)   . Heart murmur   . Mixed hyperlipidemia   . Prediabetes   . Rheumatic fever     Objective:  VITAL SIGNS: BP (!) 142/86 (BP Location: Left Arm, Patient Position: Sitting, Cuff Size: Normal)   Pulse 68   Temp 98.5 F (36.9 C) (Oral)   Ht 6\' 2"  (1.88 m)   Wt 210 lb 6 oz (95.4 kg)   SpO2 95%   BMI 27.01 kg/m  Constitutional: Well formed, well developed. No acute distress. HENT: Normocephalic, atraumatic.  Thorax & Lungs:  No accessory muscle use Musculoskeletal: low back.   Tenderness to palpation: Mild tenderness over the left erector spinae muscle group in the lower thoracic and lumbar region Deformity: no Ecchymosis: no Straight leg test: negative for Poor hamstring flexibility b/l. Neurologic: Normal sensory function. No focal deficits noted. DTR's equal and symmetric in LE's. No clonus.  5/5 strength throughout the lower extremities. Psychiatric: Normal mood. Age appropriate judgment and insight. Alert & oriented x 3.    Assessment:  Acute left-sided low back pain without sciatica - Plan: meloxicam (MOBIC) 7.5 MG tablet  Plan: Stretches/exercises, heat, ice, Tylenol, Mobic as above.  PT if no improvement. F/u prn. The patient voiced understanding and agreement to the  plan.   [place], DO 10/14/20  1:44 PM

## 2020-10-14 NOTE — Patient Instructions (Signed)

## 2020-10-20 ENCOUNTER — Other Ambulatory Visit: Payer: Self-pay | Admitting: Family Medicine

## 2020-10-20 DIAGNOSIS — M546 Pain in thoracic spine: Secondary | ICD-10-CM | POA: Diagnosis not present

## 2020-10-20 DIAGNOSIS — M9902 Segmental and somatic dysfunction of thoracic region: Secondary | ICD-10-CM | POA: Diagnosis not present

## 2020-10-20 DIAGNOSIS — M62830 Muscle spasm of back: Secondary | ICD-10-CM | POA: Diagnosis not present

## 2020-10-20 DIAGNOSIS — M5136 Other intervertebral disc degeneration, lumbar region: Secondary | ICD-10-CM | POA: Diagnosis not present

## 2020-10-20 DIAGNOSIS — M5413 Radiculopathy, cervicothoracic region: Secondary | ICD-10-CM | POA: Diagnosis not present

## 2020-10-20 DIAGNOSIS — M50323 Other cervical disc degeneration at C6-C7 level: Secondary | ICD-10-CM | POA: Diagnosis not present

## 2020-10-20 DIAGNOSIS — M9901 Segmental and somatic dysfunction of cervical region: Secondary | ICD-10-CM | POA: Diagnosis not present

## 2020-10-20 DIAGNOSIS — M9903 Segmental and somatic dysfunction of lumbar region: Secondary | ICD-10-CM | POA: Diagnosis not present

## 2020-10-22 DIAGNOSIS — M5413 Radiculopathy, cervicothoracic region: Secondary | ICD-10-CM | POA: Diagnosis not present

## 2020-10-22 DIAGNOSIS — M50323 Other cervical disc degeneration at C6-C7 level: Secondary | ICD-10-CM | POA: Diagnosis not present

## 2020-10-22 DIAGNOSIS — M5136 Other intervertebral disc degeneration, lumbar region: Secondary | ICD-10-CM | POA: Diagnosis not present

## 2020-10-22 DIAGNOSIS — M9902 Segmental and somatic dysfunction of thoracic region: Secondary | ICD-10-CM | POA: Diagnosis not present

## 2020-10-22 DIAGNOSIS — M9901 Segmental and somatic dysfunction of cervical region: Secondary | ICD-10-CM | POA: Diagnosis not present

## 2020-10-22 DIAGNOSIS — M9903 Segmental and somatic dysfunction of lumbar region: Secondary | ICD-10-CM | POA: Diagnosis not present

## 2020-10-22 DIAGNOSIS — M546 Pain in thoracic spine: Secondary | ICD-10-CM | POA: Diagnosis not present

## 2020-10-22 DIAGNOSIS — M62830 Muscle spasm of back: Secondary | ICD-10-CM | POA: Diagnosis not present

## 2020-10-22 DIAGNOSIS — M545 Low back pain, unspecified: Secondary | ICD-10-CM

## 2020-10-23 MED ORDER — MELOXICAM 7.5 MG PO TABS: ORAL_TABLET | ORAL | 2 refills | Status: DC

## 2020-10-24 DIAGNOSIS — M62830 Muscle spasm of back: Secondary | ICD-10-CM | POA: Diagnosis not present

## 2020-10-24 DIAGNOSIS — M9902 Segmental and somatic dysfunction of thoracic region: Secondary | ICD-10-CM | POA: Diagnosis not present

## 2020-10-24 DIAGNOSIS — M546 Pain in thoracic spine: Secondary | ICD-10-CM | POA: Diagnosis not present

## 2020-10-24 DIAGNOSIS — M9901 Segmental and somatic dysfunction of cervical region: Secondary | ICD-10-CM | POA: Diagnosis not present

## 2020-10-24 DIAGNOSIS — M9903 Segmental and somatic dysfunction of lumbar region: Secondary | ICD-10-CM | POA: Diagnosis not present

## 2020-10-24 DIAGNOSIS — M5136 Other intervertebral disc degeneration, lumbar region: Secondary | ICD-10-CM | POA: Diagnosis not present

## 2020-10-24 DIAGNOSIS — M5413 Radiculopathy, cervicothoracic region: Secondary | ICD-10-CM | POA: Diagnosis not present

## 2020-10-24 DIAGNOSIS — M50323 Other cervical disc degeneration at C6-C7 level: Secondary | ICD-10-CM | POA: Diagnosis not present

## 2020-10-27 DIAGNOSIS — M9901 Segmental and somatic dysfunction of cervical region: Secondary | ICD-10-CM | POA: Diagnosis not present

## 2020-10-27 DIAGNOSIS — M546 Pain in thoracic spine: Secondary | ICD-10-CM | POA: Diagnosis not present

## 2020-10-27 DIAGNOSIS — M50323 Other cervical disc degeneration at C6-C7 level: Secondary | ICD-10-CM | POA: Diagnosis not present

## 2020-10-27 DIAGNOSIS — M5413 Radiculopathy, cervicothoracic region: Secondary | ICD-10-CM | POA: Diagnosis not present

## 2020-10-27 DIAGNOSIS — M5136 Other intervertebral disc degeneration, lumbar region: Secondary | ICD-10-CM | POA: Diagnosis not present

## 2020-10-27 DIAGNOSIS — M9903 Segmental and somatic dysfunction of lumbar region: Secondary | ICD-10-CM | POA: Diagnosis not present

## 2020-10-27 DIAGNOSIS — M9902 Segmental and somatic dysfunction of thoracic region: Secondary | ICD-10-CM | POA: Diagnosis not present

## 2020-10-27 DIAGNOSIS — M62830 Muscle spasm of back: Secondary | ICD-10-CM | POA: Diagnosis not present

## 2020-11-02 ENCOUNTER — Other Ambulatory Visit: Payer: Self-pay | Admitting: Family Medicine

## 2020-12-17 ENCOUNTER — Other Ambulatory Visit: Payer: Self-pay | Admitting: Family Medicine

## 2021-01-14 DIAGNOSIS — Z20822 Contact with and (suspected) exposure to covid-19: Secondary | ICD-10-CM | POA: Diagnosis not present

## 2021-02-01 ENCOUNTER — Other Ambulatory Visit: Payer: Self-pay | Admitting: Family Medicine

## 2021-02-01 DIAGNOSIS — M545 Low back pain, unspecified: Secondary | ICD-10-CM

## 2021-03-16 DIAGNOSIS — M5136 Other intervertebral disc degeneration, lumbar region: Secondary | ICD-10-CM | POA: Diagnosis not present

## 2021-03-16 DIAGNOSIS — M546 Pain in thoracic spine: Secondary | ICD-10-CM | POA: Diagnosis not present

## 2021-03-16 DIAGNOSIS — M9901 Segmental and somatic dysfunction of cervical region: Secondary | ICD-10-CM | POA: Diagnosis not present

## 2021-03-16 DIAGNOSIS — M50323 Other cervical disc degeneration at C6-C7 level: Secondary | ICD-10-CM | POA: Diagnosis not present

## 2021-03-16 DIAGNOSIS — M9903 Segmental and somatic dysfunction of lumbar region: Secondary | ICD-10-CM | POA: Diagnosis not present

## 2021-03-16 DIAGNOSIS — M62830 Muscle spasm of back: Secondary | ICD-10-CM | POA: Diagnosis not present

## 2021-03-16 DIAGNOSIS — M5413 Radiculopathy, cervicothoracic region: Secondary | ICD-10-CM | POA: Diagnosis not present

## 2021-03-16 DIAGNOSIS — M9902 Segmental and somatic dysfunction of thoracic region: Secondary | ICD-10-CM | POA: Diagnosis not present

## 2021-03-18 DIAGNOSIS — M9902 Segmental and somatic dysfunction of thoracic region: Secondary | ICD-10-CM | POA: Diagnosis not present

## 2021-03-18 DIAGNOSIS — M9903 Segmental and somatic dysfunction of lumbar region: Secondary | ICD-10-CM | POA: Diagnosis not present

## 2021-03-18 DIAGNOSIS — M9901 Segmental and somatic dysfunction of cervical region: Secondary | ICD-10-CM | POA: Diagnosis not present

## 2021-03-18 DIAGNOSIS — M5413 Radiculopathy, cervicothoracic region: Secondary | ICD-10-CM | POA: Diagnosis not present

## 2021-03-18 DIAGNOSIS — M546 Pain in thoracic spine: Secondary | ICD-10-CM | POA: Diagnosis not present

## 2021-03-18 DIAGNOSIS — M62830 Muscle spasm of back: Secondary | ICD-10-CM | POA: Diagnosis not present

## 2021-03-18 DIAGNOSIS — M50323 Other cervical disc degeneration at C6-C7 level: Secondary | ICD-10-CM | POA: Diagnosis not present

## 2021-03-18 DIAGNOSIS — M5136 Other intervertebral disc degeneration, lumbar region: Secondary | ICD-10-CM | POA: Diagnosis not present

## 2021-03-19 DIAGNOSIS — M5413 Radiculopathy, cervicothoracic region: Secondary | ICD-10-CM | POA: Diagnosis not present

## 2021-03-19 DIAGNOSIS — M62830 Muscle spasm of back: Secondary | ICD-10-CM | POA: Diagnosis not present

## 2021-03-19 DIAGNOSIS — M9902 Segmental and somatic dysfunction of thoracic region: Secondary | ICD-10-CM | POA: Diagnosis not present

## 2021-03-19 DIAGNOSIS — M9903 Segmental and somatic dysfunction of lumbar region: Secondary | ICD-10-CM | POA: Diagnosis not present

## 2021-03-19 DIAGNOSIS — M9901 Segmental and somatic dysfunction of cervical region: Secondary | ICD-10-CM | POA: Diagnosis not present

## 2021-03-19 DIAGNOSIS — M546 Pain in thoracic spine: Secondary | ICD-10-CM | POA: Diagnosis not present

## 2021-03-19 DIAGNOSIS — M5136 Other intervertebral disc degeneration, lumbar region: Secondary | ICD-10-CM | POA: Diagnosis not present

## 2021-03-19 DIAGNOSIS — M50323 Other cervical disc degeneration at C6-C7 level: Secondary | ICD-10-CM | POA: Diagnosis not present

## 2021-03-25 ENCOUNTER — Other Ambulatory Visit: Payer: Self-pay | Admitting: Family Medicine

## 2021-03-25 DIAGNOSIS — M9901 Segmental and somatic dysfunction of cervical region: Secondary | ICD-10-CM | POA: Diagnosis not present

## 2021-03-25 DIAGNOSIS — M9903 Segmental and somatic dysfunction of lumbar region: Secondary | ICD-10-CM | POA: Diagnosis not present

## 2021-03-25 DIAGNOSIS — M546 Pain in thoracic spine: Secondary | ICD-10-CM | POA: Diagnosis not present

## 2021-03-25 DIAGNOSIS — M5413 Radiculopathy, cervicothoracic region: Secondary | ICD-10-CM | POA: Diagnosis not present

## 2021-03-25 DIAGNOSIS — M5136 Other intervertebral disc degeneration, lumbar region: Secondary | ICD-10-CM | POA: Diagnosis not present

## 2021-03-25 DIAGNOSIS — M9902 Segmental and somatic dysfunction of thoracic region: Secondary | ICD-10-CM | POA: Diagnosis not present

## 2021-03-25 DIAGNOSIS — M62830 Muscle spasm of back: Secondary | ICD-10-CM | POA: Diagnosis not present

## 2021-03-25 DIAGNOSIS — M50323 Other cervical disc degeneration at C6-C7 level: Secondary | ICD-10-CM | POA: Diagnosis not present

## 2021-04-24 DIAGNOSIS — H02842 Edema of right lower eyelid: Secondary | ICD-10-CM | POA: Diagnosis not present

## 2021-04-24 DIAGNOSIS — H02841 Edema of right upper eyelid: Secondary | ICD-10-CM | POA: Diagnosis not present

## 2021-04-24 DIAGNOSIS — H1031 Unspecified acute conjunctivitis, right eye: Secondary | ICD-10-CM | POA: Diagnosis not present

## 2021-04-27 ENCOUNTER — Other Ambulatory Visit: Payer: Self-pay | Admitting: Family Medicine

## 2021-04-27 DIAGNOSIS — M545 Low back pain, unspecified: Secondary | ICD-10-CM

## 2021-04-29 ENCOUNTER — Other Ambulatory Visit: Payer: Self-pay | Admitting: Family Medicine

## 2021-05-05 DIAGNOSIS — M9903 Segmental and somatic dysfunction of lumbar region: Secondary | ICD-10-CM | POA: Diagnosis not present

## 2021-05-05 DIAGNOSIS — M50323 Other cervical disc degeneration at C6-C7 level: Secondary | ICD-10-CM | POA: Diagnosis not present

## 2021-05-05 DIAGNOSIS — M9901 Segmental and somatic dysfunction of cervical region: Secondary | ICD-10-CM | POA: Diagnosis not present

## 2021-05-05 DIAGNOSIS — M5136 Other intervertebral disc degeneration, lumbar region: Secondary | ICD-10-CM | POA: Diagnosis not present

## 2021-05-05 DIAGNOSIS — M62830 Muscle spasm of back: Secondary | ICD-10-CM | POA: Diagnosis not present

## 2021-05-05 DIAGNOSIS — M546 Pain in thoracic spine: Secondary | ICD-10-CM | POA: Diagnosis not present

## 2021-05-05 DIAGNOSIS — M9902 Segmental and somatic dysfunction of thoracic region: Secondary | ICD-10-CM | POA: Diagnosis not present

## 2021-05-05 DIAGNOSIS — M5413 Radiculopathy, cervicothoracic region: Secondary | ICD-10-CM | POA: Diagnosis not present

## 2021-05-13 DIAGNOSIS — M9902 Segmental and somatic dysfunction of thoracic region: Secondary | ICD-10-CM | POA: Diagnosis not present

## 2021-05-13 DIAGNOSIS — M9903 Segmental and somatic dysfunction of lumbar region: Secondary | ICD-10-CM | POA: Diagnosis not present

## 2021-05-13 DIAGNOSIS — M546 Pain in thoracic spine: Secondary | ICD-10-CM | POA: Diagnosis not present

## 2021-05-13 DIAGNOSIS — M50323 Other cervical disc degeneration at C6-C7 level: Secondary | ICD-10-CM | POA: Diagnosis not present

## 2021-05-13 DIAGNOSIS — M5136 Other intervertebral disc degeneration, lumbar region: Secondary | ICD-10-CM | POA: Diagnosis not present

## 2021-05-13 DIAGNOSIS — M5413 Radiculopathy, cervicothoracic region: Secondary | ICD-10-CM | POA: Diagnosis not present

## 2021-05-13 DIAGNOSIS — M9901 Segmental and somatic dysfunction of cervical region: Secondary | ICD-10-CM | POA: Diagnosis not present

## 2021-05-13 DIAGNOSIS — M62830 Muscle spasm of back: Secondary | ICD-10-CM | POA: Diagnosis not present

## 2021-05-15 DIAGNOSIS — M5136 Other intervertebral disc degeneration, lumbar region: Secondary | ICD-10-CM | POA: Diagnosis not present

## 2021-05-15 DIAGNOSIS — M50323 Other cervical disc degeneration at C6-C7 level: Secondary | ICD-10-CM | POA: Diagnosis not present

## 2021-05-15 DIAGNOSIS — M546 Pain in thoracic spine: Secondary | ICD-10-CM | POA: Diagnosis not present

## 2021-05-15 DIAGNOSIS — M9902 Segmental and somatic dysfunction of thoracic region: Secondary | ICD-10-CM | POA: Diagnosis not present

## 2021-05-15 DIAGNOSIS — M9903 Segmental and somatic dysfunction of lumbar region: Secondary | ICD-10-CM | POA: Diagnosis not present

## 2021-05-15 DIAGNOSIS — M62830 Muscle spasm of back: Secondary | ICD-10-CM | POA: Diagnosis not present

## 2021-05-15 DIAGNOSIS — M5413 Radiculopathy, cervicothoracic region: Secondary | ICD-10-CM | POA: Diagnosis not present

## 2021-05-15 DIAGNOSIS — M9901 Segmental and somatic dysfunction of cervical region: Secondary | ICD-10-CM | POA: Diagnosis not present

## 2021-05-20 DIAGNOSIS — M9901 Segmental and somatic dysfunction of cervical region: Secondary | ICD-10-CM | POA: Diagnosis not present

## 2021-05-20 DIAGNOSIS — M50323 Other cervical disc degeneration at C6-C7 level: Secondary | ICD-10-CM | POA: Diagnosis not present

## 2021-05-20 DIAGNOSIS — M9902 Segmental and somatic dysfunction of thoracic region: Secondary | ICD-10-CM | POA: Diagnosis not present

## 2021-05-20 DIAGNOSIS — M9903 Segmental and somatic dysfunction of lumbar region: Secondary | ICD-10-CM | POA: Diagnosis not present

## 2021-05-20 DIAGNOSIS — M5413 Radiculopathy, cervicothoracic region: Secondary | ICD-10-CM | POA: Diagnosis not present

## 2021-05-20 DIAGNOSIS — M5136 Other intervertebral disc degeneration, lumbar region: Secondary | ICD-10-CM | POA: Diagnosis not present

## 2021-05-20 DIAGNOSIS — M546 Pain in thoracic spine: Secondary | ICD-10-CM | POA: Diagnosis not present

## 2021-05-20 DIAGNOSIS — M62830 Muscle spasm of back: Secondary | ICD-10-CM | POA: Diagnosis not present

## 2021-06-01 DIAGNOSIS — M5413 Radiculopathy, cervicothoracic region: Secondary | ICD-10-CM | POA: Diagnosis not present

## 2021-06-01 DIAGNOSIS — M546 Pain in thoracic spine: Secondary | ICD-10-CM | POA: Diagnosis not present

## 2021-06-01 DIAGNOSIS — M9903 Segmental and somatic dysfunction of lumbar region: Secondary | ICD-10-CM | POA: Diagnosis not present

## 2021-06-01 DIAGNOSIS — M62830 Muscle spasm of back: Secondary | ICD-10-CM | POA: Diagnosis not present

## 2021-06-01 DIAGNOSIS — M5136 Other intervertebral disc degeneration, lumbar region: Secondary | ICD-10-CM | POA: Diagnosis not present

## 2021-06-01 DIAGNOSIS — M9902 Segmental and somatic dysfunction of thoracic region: Secondary | ICD-10-CM | POA: Diagnosis not present

## 2021-06-01 DIAGNOSIS — M50323 Other cervical disc degeneration at C6-C7 level: Secondary | ICD-10-CM | POA: Diagnosis not present

## 2021-06-01 DIAGNOSIS — M9901 Segmental and somatic dysfunction of cervical region: Secondary | ICD-10-CM | POA: Diagnosis not present

## 2021-06-05 DIAGNOSIS — M62830 Muscle spasm of back: Secondary | ICD-10-CM | POA: Diagnosis not present

## 2021-06-05 DIAGNOSIS — M5136 Other intervertebral disc degeneration, lumbar region: Secondary | ICD-10-CM | POA: Diagnosis not present

## 2021-06-05 DIAGNOSIS — M50323 Other cervical disc degeneration at C6-C7 level: Secondary | ICD-10-CM | POA: Diagnosis not present

## 2021-06-05 DIAGNOSIS — M9901 Segmental and somatic dysfunction of cervical region: Secondary | ICD-10-CM | POA: Diagnosis not present

## 2021-06-05 DIAGNOSIS — M9902 Segmental and somatic dysfunction of thoracic region: Secondary | ICD-10-CM | POA: Diagnosis not present

## 2021-06-05 DIAGNOSIS — M5413 Radiculopathy, cervicothoracic region: Secondary | ICD-10-CM | POA: Diagnosis not present

## 2021-06-05 DIAGNOSIS — M546 Pain in thoracic spine: Secondary | ICD-10-CM | POA: Diagnosis not present

## 2021-06-05 DIAGNOSIS — M9903 Segmental and somatic dysfunction of lumbar region: Secondary | ICD-10-CM | POA: Diagnosis not present

## 2021-06-21 ENCOUNTER — Other Ambulatory Visit: Payer: Self-pay | Admitting: Family Medicine

## 2021-06-24 ENCOUNTER — Other Ambulatory Visit: Payer: Self-pay

## 2021-06-24 ENCOUNTER — Ambulatory Visit (INDEPENDENT_AMBULATORY_CARE_PROVIDER_SITE_OTHER): Payer: Medicare Other

## 2021-06-24 ENCOUNTER — Ambulatory Visit (INDEPENDENT_AMBULATORY_CARE_PROVIDER_SITE_OTHER): Payer: Medicare Other | Admitting: Orthopedic Surgery

## 2021-06-24 ENCOUNTER — Encounter: Payer: Self-pay | Admitting: Orthopedic Surgery

## 2021-06-24 DIAGNOSIS — M792 Neuralgia and neuritis, unspecified: Secondary | ICD-10-CM

## 2021-06-24 DIAGNOSIS — R202 Paresthesia of skin: Secondary | ICD-10-CM

## 2021-06-24 DIAGNOSIS — M79601 Pain in right arm: Secondary | ICD-10-CM

## 2021-06-24 DIAGNOSIS — R200 Anesthesia of skin: Secondary | ICD-10-CM | POA: Diagnosis not present

## 2021-06-24 NOTE — Progress Notes (Signed)
Office Visit Note   Patient: Joseph [name]           Date of Birth: 1955/10/22           MRN: [id number] Visit Date: 06/24/2021 Requested by: [name], [address],  [place] [address] PCP: [name], DO  Subjective: Chief Complaint  Patient presents with   Right Shoulder - Pain    HPI: Keiton Cosma is a 66 y.o. male who presents to the office complaining of right shoulder pain.  Patient has history of total shoulder arthroplasty in 2014 at outside hospital in [place].  He has been seen previously in the office about 1.5 years ago.  Shoulder pain feels about the same since then but he complains of increased tingling superiorly in the shoulder that radiates up his neck into the right cheek.  States this happens "all day".  Laying down helps with the shoulder pain and neck tingling.  He has associated shoulder blade pain as well.  Pain and symptoms wake him up at night at times.  He does have a history of 2 cervical spine fusions that were done about 28 years ago.  Denies any fevers, chills, night sweats, change in the appearance of the incision.  Raising his arm causes a little bit of anterolateral pain in the shoulder.  Takes Tylenol as needed for pain control.  Last check MRI was long ago and he has had ESI's in the past.  He is little bit hesitant about ESI's due to having a "nerve problem" in his back following a lumbar spine injection previously at an outside facility.  Tingling in the shoulder and neck is bothering him the most currently..                ROS: All systems reviewed are negative as they relate to the chief complaint within the history of present illness.  Patient denies fevers or chills.  Assessment & Plan: Visit Diagnoses:  1. Numbness and tingling of right arm   2. Right arm pain   3. Radicular pain in right arm     Plan: Patient is a 66 year old male who presents for repeat evaluation of right shoulder pain  and right shoulder numbness/tingling.  Excellent rotator cuff strength on exam with no sign of infection on exam.  He has good range of motion on exam today.  No change in the appearance of the implant on radiographs today compared with radiographs from 1.5 years ago.  His main complaint is persistent numbness and tingling in the superior shoulder that travels into his neck and cheek.  This has been ongoing for several months and is becoming more more consistent with the point that it is now happening all day.  With radiographs of the cervical spine demonstrating adjacent segment disease at C3-C4, plan to order MRI of the cervical spine for further evaluation.  Especially since symptoms have been ongoing for longer than 6 weeks and he has failed conservative management.  Follow-up after MRI to review results and decide next course of action.  Follow-Up Instructions: No follow-ups on file.   Orders:  Orders Placed This Encounter  Procedures   XR Shoulder Right   XR Cervical Spine 2 or 3 views   MR Cervical Spine w/o contrast   No orders of the defined types were placed in this encounter.     Procedures: No procedures performed   Clinical Data: No additional findings.  Objective: Vital  Signs: There were no vitals taken for this visit.  Physical Exam:  Constitutional: Patient appears well-developed HEENT:  Head: Normocephalic Eyes:EOM are normal Neck: Normal range of motion Cardiovascular: Normal rate Pulmonary/chest: Effort normal Neurologic: Patient is alert Skin: Skin is warm Psychiatric: Patient has normal mood and affect  Ortho Exam: Ortho exam demonstrates right shoulder with 25 degrees external rotation, 85 degrees abduction, 120 degrees forward flexion.  Incision is well-healed from prior surgery.  Excellent rotator cuff strength of supra, infra, subscap rated 5/5.  5/5 motor strength of bilateral grip strength, finger abduction, pronation/supination, bicep, tricep, deltoid.   No significant crepitus noted with passive motion of the shoulder.  No tenderness throughout the axial cervical spine.  Increased pain with cervical spine range of motion.  Slightly decreased sensation over the lateral aspect of the right shoulder compared to the left.  Full sensation intact through all other dermatomes of the bilateral upper extremities.  Specialty Comments:  No specialty comments available.  Imaging: No results found.   PMFS History: Patient Active Problem List   Diagnosis Date Noted   Insulin resistance 09/03/2019   Cubital tunnel syndrome 07/16/2019   Localized, primary osteoarthritis of shoulder region 07/16/2019   Shoulder pain 07/16/2019   Low testosterone 04/13/2018   Mixed hyperlipidemia    Bilateral sensorineural hearing loss 11/01/2017   Subjective tinnitus of right ear 11/01/2017   Hyperlipidemia 09/14/2017   History of tobacco abuse 09/14/2017   Overweight (BMI 25.0-29.9) 09/14/2017   Vertigo 07/14/2016   Arthritis of hand 02/09/2016   Right hand pain 02/09/2016   Cervical spondylosis with myelopathy 01/15/2016   Lumbar facet arthropathy 12/12/2015   Lumbar stenosis 12/12/2015   Meralgia paresthetica 12/12/2015   Neurofibroma 12/12/2015   Other intervertebral disc degeneration, lumbar region 12/12/2015   Dysphagia 10/31/2015   GERD (gastroesophageal reflux disease) 10/31/2015   Neuropathy 10/21/2015   Essential hypertension 02/11/2015   Shingles 07/10/2014   Low back pain 05/01/2014   Kidney stone 05/01/2014   Past Medical History:  Diagnosis Date   Arthritis    GERD (gastroesophageal reflux disease)    Heart murmur    Mixed hyperlipidemia    Prediabetes    Rheumatic fever     Family History  Problem Relation Age of Onset   Arthritis Mother        Rheumatoid   Diabetes Mother    Arthritis Father    Hypertension Father    Diabetes Brother    Lupus Brother     Past Surgical History:  Procedure Laterality Date   BACK SURGERY   2012   JOINT REPLACEMENT Right 2013   [place]   C4-5, C5-C6    TONSILLECTOMY  1963   Social History   Occupational History   Occupation: Disability    Comment: Shoulder / Back  Tobacco Use   Smoking status: Former    Packs/day: 1.50    Years: 20.00    Pack years: 30.00    Types: Cigarettes    Quit date: 05/01/1990    Years since quitting: 31.1   Smokeless tobacco: Never  Substance and Sexual Activity   Alcohol use: No    Alcohol/week: 0.0 standard drinks   Drug use: No   Sexual activity: Yes    Partners: Female

## 2021-07-10 ENCOUNTER — Other Ambulatory Visit: Payer: Self-pay | Admitting: Family Medicine

## 2021-07-10 DIAGNOSIS — M545 Low back pain, unspecified: Secondary | ICD-10-CM

## 2021-07-11 ENCOUNTER — Ambulatory Visit (HOSPITAL_BASED_OUTPATIENT_CLINIC_OR_DEPARTMENT_OTHER)
Admission: RE | Admit: 2021-07-11 | Discharge: 2021-07-11 | Disposition: A | Discharge status: Home / Self Care | Payer: Medicare Other | Source: Ambulatory Visit | Attending: Orthopedic Surgery | Admitting: Orthopedic Surgery

## 2021-07-11 ENCOUNTER — Other Ambulatory Visit: Payer: Self-pay

## 2021-07-11 DIAGNOSIS — M47812 Spondylosis without myelopathy or radiculopathy, cervical region: Secondary | ICD-10-CM | POA: Diagnosis not present

## 2021-07-11 DIAGNOSIS — R200 Anesthesia of skin: Secondary | ICD-10-CM | POA: Diagnosis not present

## 2021-07-11 DIAGNOSIS — M79601 Pain in right arm: Secondary | ICD-10-CM | POA: Diagnosis not present

## 2021-07-14 ENCOUNTER — Encounter: Payer: Self-pay | Admitting: Family Medicine

## 2021-07-14 MED ORDER — ATORVASTATIN CALCIUM 40 MG PO TABS: 40.0000 mg | ORAL_TABLET | Freq: Every day | ORAL | 0 refills | Status: DC

## 2021-07-14 MED ORDER — PANTOPRAZOLE SODIUM 40 MG PO TBEC: 40.0000 mg | DELAYED_RELEASE_TABLET | Freq: Two times a day (BID) | ORAL | 0 refills | Status: DC

## 2021-07-16 ENCOUNTER — Other Ambulatory Visit: Payer: Self-pay | Admitting: Family Medicine

## 2021-07-20 ENCOUNTER — Ambulatory Visit (INDEPENDENT_AMBULATORY_CARE_PROVIDER_SITE_OTHER): Payer: Medicare Other | Admitting: Orthopedic Surgery

## 2021-07-20 ENCOUNTER — Other Ambulatory Visit: Payer: Self-pay

## 2021-07-20 DIAGNOSIS — M792 Neuralgia and neuritis, unspecified: Secondary | ICD-10-CM

## 2021-07-21 ENCOUNTER — Encounter: Payer: Self-pay | Admitting: Orthopedic Surgery

## 2021-07-21 NOTE — Progress Notes (Signed)
Office Visit Note   Patient: Joseph [name]           Date of Birth: 30-Oct-1955           MRN: [id number] Visit Date: 07/20/2021 Requested by: [name], [address] [place],  [place] [address] PCP: [name], DO  Subjective: Chief Complaint  Patient presents with   Other     Scan review    HPI: [name] is a 66 year old patient here to follow-up MRI scan.  He has neck and right shoulder pain.  Ice helps along with topicals.  He has been taking low-dose Mobic due to GI issues.  He does have a history of lumbar spine ESI which gave him "nerve damage" and he is reluctant to undergo that in the cervical spine.  He states that he gets his neck in just the right position that his symptoms improved.  Pain meds do not work for him.  He does have a history of shoulder replacement.  MRI scan is reviewed with the patient.  It does show C2-3 disc above prior fusion level.  I think this is likely causing his trapezial symptoms.              ROS: All systems reviewed are negative as they relate to the chief complaint within the history of present illness.  Patient denies  fevers or chills.   Assessment & Plan: Visit Diagnoses:  1. Radicular pain in right arm     Plan: Impression is radicular pain in the right forequarter.  Anatomic shoulder replacement is a little bit stiff but otherwise looks intact.  We talked about various options for treatment.  1 would be observation.  The next would be continuing with conservative treatment such as topicals ice and Mobic.  Next step up would be cervical spine ESI and last step would be extending the fusion.  He has no hardware in place.  He is going to live with what he has for now.  Even though he did have a bad experience with his lumbar spine ESI I do think that cervical spine ESI would be worth considering if his symptoms worsen.  He will consider the options and let me know if he wants to go with the ESI.   Also talked about surgical referral but he wants to hold off on that as well.  Follow-up with [place] as needed.  Follow-Up Instructions: Return if symptoms worsen or fail to improve.   Orders:  No orders of the defined types were placed in this encounter.  No orders of the defined types were placed in this encounter.     Procedures: No procedures performed   Clinical Data: No additional findings.  Objective: Vital Signs: There were no vitals taken for this visit.  Physical Exam:   Constitutional: Patient appears well-developed HEENT:  Head: Normocephalic Eyes:EOM are normal Neck: Normal range of motion Cardiovascular: Normal rate Pulmonary/chest: Effort normal Neurologic: Patient is alert Skin: Skin is warm Psychiatric: Patient has normal mood and affect   Ortho Exam: Ortho exam demonstrates 5 out of 5 grip EPL FPL interosseous wrist flexion extension bicep triceps and deltoid strength.  Has about 175 of forward flexion on the left and about 115 on the right.  Cuff strength is good.  Shoulder is located.  No definite paresthesias C5-T1.  Neck range of motion slightly restricted but he does have about 40 degrees of rotation and about 60% of his flexion extension  arc.  Specialty Comments:  No specialty comments available.  Imaging: No results found.   PMFS History: Patient Active Problem List   Diagnosis Date Noted   Insulin resistance 09/03/2019   Cubital tunnel syndrome 07/16/2019   Localized, primary osteoarthritis of shoulder region 07/16/2019   Shoulder pain 07/16/2019   Low testosterone 04/13/2018   Mixed hyperlipidemia    Bilateral sensorineural hearing loss 11/01/2017   Subjective tinnitus of right ear 11/01/2017   Hyperlipidemia 09/14/2017   History of tobacco abuse 09/14/2017   Overweight (BMI 25.0-29.9) 09/14/2017   Vertigo 07/14/2016   Arthritis of hand 02/09/2016   Right hand pain 02/09/2016   Cervical spondylosis with myelopathy 01/15/2016    Lumbar facet arthropathy 12/12/2015   Lumbar stenosis 12/12/2015   Meralgia paresthetica 12/12/2015   Neurofibroma 12/12/2015   Other intervertebral disc degeneration, lumbar region 12/12/2015   Dysphagia 10/31/2015   GERD (gastroesophageal reflux disease) 10/31/2015   Neuropathy 10/21/2015   Essential hypertension 02/11/2015   Shingles 07/10/2014   Low back pain 05/01/2014   Kidney stone 05/01/2014   Past Medical History:  Diagnosis Date   Arthritis    GERD (gastroesophageal reflux disease)    Heart murmur    Mixed hyperlipidemia    Prediabetes    Rheumatic fever     Family History  Problem Relation Age of Onset   Arthritis Mother        Rheumatoid   Diabetes Mother    Arthritis Father    Hypertension Father    Diabetes Brother    Lupus Brother     Past Surgical History:  Procedure Laterality Date   BACK SURGERY  2012   JOINT REPLACEMENT Right 2013   [place]   C4-5, C5-C6    TONSILLECTOMY  1963   Social History   Occupational History   Occupation: Disability    Comment: Shoulder / Back  Tobacco Use   Smoking status: Former    Packs/day: 1.50    Years: 20.00    Pack years: 30.00    Types: Cigarettes    Quit date: 05/01/1990    Years since quitting: 31.2   Smokeless tobacco: Never  Substance and Sexual Activity   Alcohol use: No    Alcohol/week: 0.0 standard drinks   Drug use: No   Sexual activity: Yes    Partners: Female

## 2021-07-24 DIAGNOSIS — H61122 Hematoma of pinna, left ear: Secondary | ICD-10-CM | POA: Diagnosis not present

## 2021-07-29 ENCOUNTER — Other Ambulatory Visit: Payer: Self-pay | Admitting: Family Medicine

## 2021-09-02 ENCOUNTER — Other Ambulatory Visit: Payer: Self-pay | Admitting: Family Medicine

## 2021-09-02 DIAGNOSIS — M545 Low back pain, unspecified: Secondary | ICD-10-CM

## 2021-09-02 NOTE — Telephone Encounter (Signed)
30 day supply sent. Letter sent to Pt via Mychart informing he is due for visit.  ?

## 2021-09-07 ENCOUNTER — Encounter: Payer: Self-pay | Admitting: Family Medicine

## 2021-09-07 ENCOUNTER — Ambulatory Visit (INDEPENDENT_AMBULATORY_CARE_PROVIDER_SITE_OTHER): Payer: Medicare Other | Admitting: Family Medicine

## 2021-09-07 VITALS — BP 124/82 | HR 69 | Temp 97.9°F | Resp 12 | Ht 74.0 in | Wt 213.4 lb

## 2021-09-07 DIAGNOSIS — M545 Low back pain, unspecified: Secondary | ICD-10-CM | POA: Diagnosis not present

## 2021-09-07 DIAGNOSIS — K219 Gastro-esophageal reflux disease without esophagitis: Secondary | ICD-10-CM

## 2021-09-07 DIAGNOSIS — E782 Mixed hyperlipidemia: Secondary | ICD-10-CM

## 2021-09-07 LAB — COMPREHENSIVE METABOLIC PANEL
ALT: 24 U/L (ref 0–53)
AST: 19 U/L (ref 0–37)
Albumin: 4.5 g/dL (ref 3.5–5.2)
Alkaline Phosphatase: 82 U/L (ref 39–117)
BUN: 20 mg/dL (ref 6–23)
CO2: 30 mEq/L (ref 19–32)
Calcium: 9.4 mg/dL (ref 8.4–10.5)
Chloride: 106 mEq/L (ref 96–112)
Creatinine, Ser: 1.39 mg/dL (ref 0.40–1.50)
GFR: 52.58 mL/min — ABNORMAL LOW (ref >60.00)
Glucose, Bld: 106 mg/dL — ABNORMAL HIGH (ref 70–99)
Potassium: 4.5 mEq/L (ref 3.5–5.1)
Sodium: 142 mEq/L (ref 135–145)
Total Bilirubin: 0.4 mg/dL (ref 0.2–1.2)
Total Protein: 6.8 g/dL (ref 6.0–8.3)

## 2021-09-07 LAB — LIPID PANEL
Cholesterol: 142 mg/dL (ref 0–200)
HDL: 43.7 mg/dL (ref >39.00)
NonHDL: 97.9
Total CHOL/HDL Ratio: 3
Triglycerides: 222 mg/dL — ABNORMAL HIGH (ref 0.0–149.0)
VLDL: 44.4 mg/dL — ABNORMAL HIGH (ref 0.0–40.0)

## 2021-09-07 LAB — LDL CHOLESTEROL, DIRECT: Direct LDL: 87 mg/dL

## 2021-09-07 MED ORDER — MELOXICAM 7.5 MG PO TABS: ORAL_TABLET | ORAL | 1 refills | Status: DC

## 2021-09-07 NOTE — Patient Instructions (Signed)
Give us 2-3 business days to get the results of your labs back.   Keep the diet clean and stay active.  Let us know if you need anything. 

## 2021-09-07 NOTE — Progress Notes (Signed)
Chief Complaint  ?Patient presents with  ? Follow-up  ? ? ?Subjective: ?Hyperlipidemia ?Patient presents for Hyperlipidemia follow up. ?Currently taking Lipitor 40 mg/d and compliance with treatment thus far has been good. ?He denies myalgias. ?He is usually adhering to a healthy diet. ?Exercise: walking ?The patient is not known to have coexisting coronary artery disease. ? ?GERD- ?Taking Protonix 40 mg/d. Compliant, no AE's, controls ss's. No bleeding, wt loss, nighttime awakenings, dysphagia, abd pain, bloating, bowel changes, N/V, fevers.  ? ?Past Medical History:  ?Diagnosis Date  ? Arthritis   ? GERD (gastroesophageal reflux disease)   ? Heart murmur   ? Mixed hyperlipidemia   ? Prediabetes   ? Rheumatic fever   ? ? ?Objective: ?BP 124/82 (BP Location: Left Arm, Cuff Size: Normal)   Pulse 69   Temp 97.9 ?F (36.6 ?C) (Oral)   Resp 12   Ht '6\' 2"'$  (1.88 m)   Wt 213 lb 6.4 oz (96.8 kg)   SpO2 100%   BMI 27.40 kg/m?  ?General: Awake, appears stated age ?HEENT: MMM ?Heart: RRR, no LE edema, no bruits ?Lungs: CTAB, no rales, wheezes or rhonchi. No accessory muscle use ?Abd:  BS+, S, Nt, ND ?Psych: Age appropriate judgment and insight, normal affect and mood ? ?Assessment and Plan: ?Mixed hyperlipidemia - Plan: Comprehensive metabolic panel, Lipid panel ? ?Gastroesophageal reflux disease, unspecified whether esophagitis present ? ?Acute left-sided low back pain without sciatica - Plan: meloxicam (MOBIC) 7.5 MG tablet ? ?Chronic, stable. Cont Lipitor 40 mg/d. Counseled on diet/exercise. ?Chronic, stable. Cont Protonix 40 mg/d.  ?F/u in 6 mo or prn. ?The patient voiced understanding and agreement to the plan. ? ?[name], DO ?09/07/21  ?1:01 PM ? ? ? ? ? ?

## 2021-09-08 ENCOUNTER — Other Ambulatory Visit: Payer: Self-pay | Admitting: Family Medicine

## 2021-09-08 DIAGNOSIS — E782 Mixed hyperlipidemia: Secondary | ICD-10-CM

## 2021-09-09 DIAGNOSIS — Z20822 Contact with and (suspected) exposure to covid-19: Secondary | ICD-10-CM | POA: Diagnosis not present

## 2021-09-23 DIAGNOSIS — M5136 Other intervertebral disc degeneration, lumbar region: Secondary | ICD-10-CM | POA: Diagnosis not present

## 2021-09-23 DIAGNOSIS — M50323 Other cervical disc degeneration at C6-C7 level: Secondary | ICD-10-CM | POA: Diagnosis not present

## 2021-09-23 DIAGNOSIS — M62830 Muscle spasm of back: Secondary | ICD-10-CM | POA: Diagnosis not present

## 2021-09-23 DIAGNOSIS — M546 Pain in thoracic spine: Secondary | ICD-10-CM | POA: Diagnosis not present

## 2021-09-23 DIAGNOSIS — M9903 Segmental and somatic dysfunction of lumbar region: Secondary | ICD-10-CM | POA: Diagnosis not present

## 2021-09-23 DIAGNOSIS — M9902 Segmental and somatic dysfunction of thoracic region: Secondary | ICD-10-CM | POA: Diagnosis not present

## 2021-09-23 DIAGNOSIS — M9901 Segmental and somatic dysfunction of cervical region: Secondary | ICD-10-CM | POA: Diagnosis not present

## 2021-09-23 DIAGNOSIS — M5413 Radiculopathy, cervicothoracic region: Secondary | ICD-10-CM | POA: Diagnosis not present

## 2021-09-24 DIAGNOSIS — M9902 Segmental and somatic dysfunction of thoracic region: Secondary | ICD-10-CM | POA: Diagnosis not present

## 2021-09-24 DIAGNOSIS — M9903 Segmental and somatic dysfunction of lumbar region: Secondary | ICD-10-CM | POA: Diagnosis not present

## 2021-09-24 DIAGNOSIS — M5413 Radiculopathy, cervicothoracic region: Secondary | ICD-10-CM | POA: Diagnosis not present

## 2021-09-24 DIAGNOSIS — M62830 Muscle spasm of back: Secondary | ICD-10-CM | POA: Diagnosis not present

## 2021-09-24 DIAGNOSIS — M546 Pain in thoracic spine: Secondary | ICD-10-CM | POA: Diagnosis not present

## 2021-09-24 DIAGNOSIS — M9901 Segmental and somatic dysfunction of cervical region: Secondary | ICD-10-CM | POA: Diagnosis not present

## 2021-09-24 DIAGNOSIS — M50323 Other cervical disc degeneration at C6-C7 level: Secondary | ICD-10-CM | POA: Diagnosis not present

## 2021-09-24 DIAGNOSIS — M5136 Other intervertebral disc degeneration, lumbar region: Secondary | ICD-10-CM | POA: Diagnosis not present

## 2021-09-25 DIAGNOSIS — M5413 Radiculopathy, cervicothoracic region: Secondary | ICD-10-CM | POA: Diagnosis not present

## 2021-09-25 DIAGNOSIS — M5136 Other intervertebral disc degeneration, lumbar region: Secondary | ICD-10-CM | POA: Diagnosis not present

## 2021-09-25 DIAGNOSIS — M9902 Segmental and somatic dysfunction of thoracic region: Secondary | ICD-10-CM | POA: Diagnosis not present

## 2021-09-25 DIAGNOSIS — M9903 Segmental and somatic dysfunction of lumbar region: Secondary | ICD-10-CM | POA: Diagnosis not present

## 2021-09-25 DIAGNOSIS — M9901 Segmental and somatic dysfunction of cervical region: Secondary | ICD-10-CM | POA: Diagnosis not present

## 2021-09-25 DIAGNOSIS — M50323 Other cervical disc degeneration at C6-C7 level: Secondary | ICD-10-CM | POA: Diagnosis not present

## 2021-09-25 DIAGNOSIS — M546 Pain in thoracic spine: Secondary | ICD-10-CM | POA: Diagnosis not present

## 2021-09-25 DIAGNOSIS — M62830 Muscle spasm of back: Secondary | ICD-10-CM | POA: Diagnosis not present

## 2021-10-03 ENCOUNTER — Other Ambulatory Visit: Payer: Self-pay | Admitting: Family Medicine

## 2021-10-12 ENCOUNTER — Telehealth: Payer: Self-pay

## 2021-10-12 DIAGNOSIS — Z20822 Contact with and (suspected) exposure to covid-19: Secondary | ICD-10-CM | POA: Diagnosis not present

## 2021-10-12 NOTE — Telephone Encounter (Signed)
Nurse Assessment ?Nurse: Zorita Pang, RN, Deborah Date/Time (Eastern Time): 10/12/2021 11:13:11 AM ?Confirm and document reason for call. If ?symptomatic, describe symptoms. ?---The caller states that he is having dizziness and light ?headedness for about a year. Also having neck pain ?with vertebrae. States that he also has trouble with ?esophagus and he got choked yesterday and his BP ?188/108. Now has come down. ?Does the patient have any new or worsening ?symptoms? ---Yes ?Will a triage be completed? ---Yes ?Related visit to physician within the last 2 weeks? ---No ?Does the PT have any chronic conditions? (i.e. ?diabetes, asthma, this includes High risk factors for ?pregnancy, etc.) ?---Yes ?List chronic conditions. ---vertigo, esophageal stricture ?Is this a behavioral health or substance abuse call? ---No ?Guidelines ?Guideline Title Affirmed Question Affirmed Notes Nurse Date/Time (Eastern ?Time) ?Dizziness - ?Lightheadedness ?[1] MODERATE ?dizziness (e.g., ?interferes with ?normal activities) ?AND [2] has NOT ?been evaluated by ?Womble, RN, ?Neoma Laming ?10/12/2021 11:17:09 ?AM ?PLEASE NOTE: All timestamps contained within this report are represented as [place] Standard Time. ?CONFIDENTIALTY NOTICE: This fax transmission is intended only for the addressee. It contains information that is legally privileged, confidential or ?otherwise protected from use or disclosure. If you are not the intended recipient, you are strictly prohibited from reviewing, disclosing, copying using ?or disseminating any of this information or taking any action in reliance on or regarding this information. If you have received this fax in error, please ?notify us immediately by telephone so that we can arrange for its return to [place]. Phone: [phone], Toll-Free: [phone], Fax: [phone] ?Page: 2 of 2 ?Call Id: [id number] ?Guidelines ?Guideline Title Affirmed Question Affirmed Notes Nurse Date/Time (Eastern ?Time) ?physician for  this ?(Exception: dizziness ?caused by heat ?exposure, sudden ?standing, or poor ?fluid intake) ?Disp. Time (Eastern ?Time) Disposition Final User ?10/12/2021 11:24:36 AM See PCP within 24 Hours Yes Womble, RN, Neoma Laming ?Caller Disagree/Comply Comply ?Caller Understands Yes ?PreDisposition Call Doctor ?Care Advice Given Per Guideline ?SEE PCP WITHIN 24 HOURS: * Drink several glasses of fruit juice, other clear fluids or water. * You become worse ?Comments ?User: Marquis Buggy, RN Date/Time Eilene Ghazi Time): 10/12/2021 11:26:14 AM ?Caller states that his BP is back to normal. He is swallowing ok today after he had an episode of difficulty ?swallowing yesterday. Has an appointment scheduled for tomorrow. ?Referrals ?REFERRED TO PCP OFFICE ?

## 2021-10-13 ENCOUNTER — Encounter: Payer: Self-pay | Admitting: Family Medicine

## 2021-10-13 ENCOUNTER — Ambulatory Visit (INDEPENDENT_AMBULATORY_CARE_PROVIDER_SITE_OTHER): Payer: Medicare Other | Admitting: Family Medicine

## 2021-10-13 VITALS — BP 136/82 | HR 87 | Temp 98.0°F | Ht 74.0 in | Wt 212.5 lb

## 2021-10-13 DIAGNOSIS — R739 Hyperglycemia, unspecified: Secondary | ICD-10-CM | POA: Diagnosis not present

## 2021-10-13 DIAGNOSIS — E782 Mixed hyperlipidemia: Secondary | ICD-10-CM

## 2021-10-13 DIAGNOSIS — R030 Elevated blood-pressure reading, without diagnosis of hypertension: Secondary | ICD-10-CM

## 2021-10-13 LAB — LIPID PANEL
Cholesterol: 145 mg/dL (ref 0–200)
HDL: 50.6 mg/dL (ref >39.00)
LDL Cholesterol: 78 mg/dL (ref 0–99)
NonHDL: 94.53
Total CHOL/HDL Ratio: 3
Triglycerides: 81 mg/dL (ref 0.0–149.0)
VLDL: 16.2 mg/dL (ref 0.0–40.0)

## 2021-10-13 LAB — HEMOGLOBIN A1C: Hgb A1c MFr Bld: 5.5 % (ref 4.6–6.5)

## 2021-10-13 NOTE — Progress Notes (Signed)
Chief Complaint  ?Patient presents with  ? Hypertension  ? ? ?Subjective ?Silver Parkey is a 66 y.o. male who presents for and episode of elevated blood pressure.  Here with his wife. ?He does monitor home blood pressures. ?Blood pressures ranging from 120-140's/80's on average. ?2 d ago, had food stuck in chest (hx of stricture) and noticed blurry vision afterwards; ck'd BP and it was in the 180's. Did some breathing exercises and it came down. No Cp, sob, palpitations.  ?He is not medications. ?He is adhering to a healthy diet overall. ?Current exercise: walking ? ? ?Low sugar ?History of low blood sugar.  Work-up a couple years ago and was unremarkable.  He will eat some crackers and symptoms are improved.  Sometimes it happens 1 to 2 hours after eating.  No loss of consciousness or lightheadedness.  His mother had diabetes requiring insulin and he would like to make sure he does not have that. ? ?Hyperlipidemia ?Patient presents for hyperlipidemia follow up. ?Currently being treated with Lipitor 40 mg/d and compliance with treatment thus far has been good. ?He denies myalgias. ?Diet/exercise as above.  ?The patient is not known to have coexisting coronary artery disease. ?  ?Past Medical History:  ?Diagnosis Date  ? Arthritis   ? GERD (gastroesophageal reflux disease)   ? Heart murmur   ? Mixed hyperlipidemia   ? Prediabetes   ? Rheumatic fever   ? ? ?Exam ?BP 136/82 (BP Location: Left Arm, Cuff Size: Normal)   Pulse 87   Temp 98 ?F (36.7 ?C) (Oral)   Ht '6\' 2"'$  (1.88 m)   Wt 212 lb 8 oz (96.4 kg)   SpO2 93%   BMI 27.28 kg/m?  ?General:  well developed, well nourished, in no apparent distress ?Heart: RRR, no bruits, no LE edema ?Lungs: clear to auscultation, no accessory muscle use ?Psych: well oriented with normal range of affect and appropriate judgment/insight ? ?Hyperglycemia - Plan: Hemoglobin A1c ? ?Mixed hyperlipidemia - Plan: Lipid panel ? ?Elevated blood pressure reading ? ?Check A1c.  Counseled  on diet and exercise. ?Chronic, not controlled. Cont Lipitor 40 mg/d. Ck labs, may need to add fibrate. ?Reassurance. Monitor BP at home. Likely 2/2 esoph stricture/stress.  ?F/u pending above. ?The patient and his spouse voiced understanding and agreement to the plan. ? ?[name], DO ?10/13/21  ?12:21 PM ? ?

## 2021-10-13 NOTE — Patient Instructions (Signed)
Keep the diet clean and stay active. ? ?Give [place] 2-3 business days to get the results of your labs back.  ? ?You may have to eat more frequent meals/snacks. Consider dark chocolate and almonds. ? ?Let us know if you need anything. ?

## 2021-10-21 DIAGNOSIS — M5136 Other intervertebral disc degeneration, lumbar region: Secondary | ICD-10-CM | POA: Diagnosis not present

## 2021-10-21 DIAGNOSIS — M9902 Segmental and somatic dysfunction of thoracic region: Secondary | ICD-10-CM | POA: Diagnosis not present

## 2021-10-21 DIAGNOSIS — M62830 Muscle spasm of back: Secondary | ICD-10-CM | POA: Diagnosis not present

## 2021-10-21 DIAGNOSIS — M9901 Segmental and somatic dysfunction of cervical region: Secondary | ICD-10-CM | POA: Diagnosis not present

## 2021-10-21 DIAGNOSIS — M5413 Radiculopathy, cervicothoracic region: Secondary | ICD-10-CM | POA: Diagnosis not present

## 2021-10-21 DIAGNOSIS — M50323 Other cervical disc degeneration at C6-C7 level: Secondary | ICD-10-CM | POA: Diagnosis not present

## 2021-10-21 DIAGNOSIS — M546 Pain in thoracic spine: Secondary | ICD-10-CM | POA: Diagnosis not present

## 2021-10-21 DIAGNOSIS — M9903 Segmental and somatic dysfunction of lumbar region: Secondary | ICD-10-CM | POA: Diagnosis not present

## 2021-10-26 DIAGNOSIS — M50323 Other cervical disc degeneration at C6-C7 level: Secondary | ICD-10-CM | POA: Diagnosis not present

## 2021-10-26 DIAGNOSIS — M546 Pain in thoracic spine: Secondary | ICD-10-CM | POA: Diagnosis not present

## 2021-10-26 DIAGNOSIS — M9901 Segmental and somatic dysfunction of cervical region: Secondary | ICD-10-CM | POA: Diagnosis not present

## 2021-10-26 DIAGNOSIS — M9903 Segmental and somatic dysfunction of lumbar region: Secondary | ICD-10-CM | POA: Diagnosis not present

## 2021-10-26 DIAGNOSIS — M9902 Segmental and somatic dysfunction of thoracic region: Secondary | ICD-10-CM | POA: Diagnosis not present

## 2021-10-26 DIAGNOSIS — M5413 Radiculopathy, cervicothoracic region: Secondary | ICD-10-CM | POA: Diagnosis not present

## 2021-10-26 DIAGNOSIS — M62830 Muscle spasm of back: Secondary | ICD-10-CM | POA: Diagnosis not present

## 2021-10-26 DIAGNOSIS — M5136 Other intervertebral disc degeneration, lumbar region: Secondary | ICD-10-CM | POA: Diagnosis not present

## 2021-10-30 ENCOUNTER — Other Ambulatory Visit: Payer: Self-pay | Admitting: Family Medicine

## 2021-11-05 DIAGNOSIS — M4722 Other spondylosis with radiculopathy, cervical region: Secondary | ICD-10-CM | POA: Diagnosis not present

## 2021-11-05 DIAGNOSIS — M9903 Segmental and somatic dysfunction of lumbar region: Secondary | ICD-10-CM | POA: Diagnosis not present

## 2021-11-05 DIAGNOSIS — M7912 Myalgia of auxiliary muscles, head and neck: Secondary | ICD-10-CM | POA: Diagnosis not present

## 2021-11-05 DIAGNOSIS — M9901 Segmental and somatic dysfunction of cervical region: Secondary | ICD-10-CM | POA: Diagnosis not present

## 2021-11-05 DIAGNOSIS — M5387 Other specified dorsopathies, lumbosacral region: Secondary | ICD-10-CM | POA: Diagnosis not present

## 2021-11-05 DIAGNOSIS — M9902 Segmental and somatic dysfunction of thoracic region: Secondary | ICD-10-CM | POA: Diagnosis not present

## 2021-11-16 DIAGNOSIS — M5413 Radiculopathy, cervicothoracic region: Secondary | ICD-10-CM | POA: Diagnosis not present

## 2021-11-16 DIAGNOSIS — M50323 Other cervical disc degeneration at C6-C7 level: Secondary | ICD-10-CM | POA: Diagnosis not present

## 2021-11-16 DIAGNOSIS — M9901 Segmental and somatic dysfunction of cervical region: Secondary | ICD-10-CM | POA: Diagnosis not present

## 2021-11-16 DIAGNOSIS — M5136 Other intervertebral disc degeneration, lumbar region: Secondary | ICD-10-CM | POA: Diagnosis not present

## 2021-11-16 DIAGNOSIS — M546 Pain in thoracic spine: Secondary | ICD-10-CM | POA: Diagnosis not present

## 2021-11-16 DIAGNOSIS — M9903 Segmental and somatic dysfunction of lumbar region: Secondary | ICD-10-CM | POA: Diagnosis not present

## 2021-11-16 DIAGNOSIS — M62830 Muscle spasm of back: Secondary | ICD-10-CM | POA: Diagnosis not present

## 2021-11-16 DIAGNOSIS — M9902 Segmental and somatic dysfunction of thoracic region: Secondary | ICD-10-CM | POA: Diagnosis not present

## 2021-11-25 DIAGNOSIS — M5136 Other intervertebral disc degeneration, lumbar region: Secondary | ICD-10-CM | POA: Diagnosis not present

## 2021-11-25 DIAGNOSIS — M9902 Segmental and somatic dysfunction of thoracic region: Secondary | ICD-10-CM | POA: Diagnosis not present

## 2021-11-25 DIAGNOSIS — M62830 Muscle spasm of back: Secondary | ICD-10-CM | POA: Diagnosis not present

## 2021-11-25 DIAGNOSIS — M9903 Segmental and somatic dysfunction of lumbar region: Secondary | ICD-10-CM | POA: Diagnosis not present

## 2021-11-25 DIAGNOSIS — M5413 Radiculopathy, cervicothoracic region: Secondary | ICD-10-CM | POA: Diagnosis not present

## 2021-11-25 DIAGNOSIS — M50323 Other cervical disc degeneration at C6-C7 level: Secondary | ICD-10-CM | POA: Diagnosis not present

## 2021-11-25 DIAGNOSIS — M9901 Segmental and somatic dysfunction of cervical region: Secondary | ICD-10-CM | POA: Diagnosis not present

## 2021-11-25 DIAGNOSIS — M546 Pain in thoracic spine: Secondary | ICD-10-CM | POA: Diagnosis not present

## 2021-12-17 ENCOUNTER — Other Ambulatory Visit: Payer: Self-pay | Admitting: Family Medicine

## 2022-01-25 ENCOUNTER — Other Ambulatory Visit: Payer: Self-pay | Admitting: Family Medicine

## 2022-01-26 DIAGNOSIS — M62830 Muscle spasm of back: Secondary | ICD-10-CM | POA: Diagnosis not present

## 2022-01-26 DIAGNOSIS — M5136 Other intervertebral disc degeneration, lumbar region: Secondary | ICD-10-CM | POA: Diagnosis not present

## 2022-01-26 DIAGNOSIS — M9901 Segmental and somatic dysfunction of cervical region: Secondary | ICD-10-CM | POA: Diagnosis not present

## 2022-01-26 DIAGNOSIS — M9903 Segmental and somatic dysfunction of lumbar region: Secondary | ICD-10-CM | POA: Diagnosis not present

## 2022-01-26 DIAGNOSIS — M5413 Radiculopathy, cervicothoracic region: Secondary | ICD-10-CM | POA: Diagnosis not present

## 2022-01-26 DIAGNOSIS — M9902 Segmental and somatic dysfunction of thoracic region: Secondary | ICD-10-CM | POA: Diagnosis not present

## 2022-01-26 DIAGNOSIS — M50323 Other cervical disc degeneration at C6-C7 level: Secondary | ICD-10-CM | POA: Diagnosis not present

## 2022-01-26 DIAGNOSIS — M546 Pain in thoracic spine: Secondary | ICD-10-CM | POA: Diagnosis not present

## 2022-01-28 DIAGNOSIS — M62830 Muscle spasm of back: Secondary | ICD-10-CM | POA: Diagnosis not present

## 2022-01-28 DIAGNOSIS — M50323 Other cervical disc degeneration at C6-C7 level: Secondary | ICD-10-CM | POA: Diagnosis not present

## 2022-01-28 DIAGNOSIS — M5136 Other intervertebral disc degeneration, lumbar region: Secondary | ICD-10-CM | POA: Diagnosis not present

## 2022-01-28 DIAGNOSIS — M9903 Segmental and somatic dysfunction of lumbar region: Secondary | ICD-10-CM | POA: Diagnosis not present

## 2022-01-28 DIAGNOSIS — M9902 Segmental and somatic dysfunction of thoracic region: Secondary | ICD-10-CM | POA: Diagnosis not present

## 2022-01-28 DIAGNOSIS — M9901 Segmental and somatic dysfunction of cervical region: Secondary | ICD-10-CM | POA: Diagnosis not present

## 2022-01-28 DIAGNOSIS — M5413 Radiculopathy, cervicothoracic region: Secondary | ICD-10-CM | POA: Diagnosis not present

## 2022-01-28 DIAGNOSIS — M546 Pain in thoracic spine: Secondary | ICD-10-CM | POA: Diagnosis not present

## 2022-03-05 ENCOUNTER — Other Ambulatory Visit: Payer: Self-pay | Admitting: Family Medicine

## 2022-03-23 ENCOUNTER — Ambulatory Visit (INDEPENDENT_AMBULATORY_CARE_PROVIDER_SITE_OTHER): Payer: Medicare Other | Admitting: Podiatry

## 2022-03-23 ENCOUNTER — Ambulatory Visit: Payer: Medicare Other

## 2022-03-23 DIAGNOSIS — M79671 Pain in right foot: Secondary | ICD-10-CM | POA: Diagnosis not present

## 2022-03-23 DIAGNOSIS — M7661 Achilles tendinitis, right leg: Secondary | ICD-10-CM

## 2022-03-23 MED ORDER — METHYLPREDNISOLONE 4 MG PO TBPK: ORAL_TABLET | ORAL | 0 refills | Status: DC

## 2022-03-23 NOTE — Progress Notes (Unsigned)
Subjective:  Patient ID: Joseph [name], male    DOB: 23-Aug-1955,  MRN: [id number]  Chief Complaint  Patient presents with   Foot Pain    Room 3  Pt reports he has been having pain in his heel on his right foot , started about a month ago     66 y.o. male presents with pain in the back of his right heel.  He says this has been going on for about a month now.  He has not tried much yet for this.  Notices the pain throughout the day.  He is also concerned that it could have something to do with some nerve problems he has been experiencing including sciatica in his right side.  He is going to see a neurosurgeon in November for further evaluation.  He says that the pain in the foot and heel started around the same time his pain he developed in his knee and hip on the right side.  Does describe some of the pain as a nerve type pain burning shooting electrical sensations.  Past Medical History:  Diagnosis Date   Arthritis    GERD (gastroesophageal reflux disease)    Heart murmur    Mixed hyperlipidemia    Prediabetes    Rheumatic fever     Allergies  Allergen Reactions   Hydrocodone-Acetaminophen Nausea Only and Nausea And Vomiting   Other     Intolerance to narcotic medication - causes stomach upset and dizziness and does not relieve pain.     ROS: Negative except as per HPI above  Objective:  General: AAO x3, NAD  Dermatological: With inspection and palpation of the right and left lower extremities there are no open sores, no preulcerative lesions, no rash or signs of infection present. Nails are of normal length thickness and coloration.   Vascular:  Dorsalis Pedis artery and Posterior Tibial artery pedal pulses are 2/4 bilateral.  Capillary fill time < 3 sec to all digits.   Neruologic: Grossly intact via light touch bilateral. Protective threshold intact to all sites bilateral.   Musculoskeletal: Pain at achilles inerstion and just distal with palpation.  Mild to moderate  osseous prominence at the posterior aspect of the calcaneus consistent with Haglund's deformity.  Decreased ankle range of motion with dorsiflexion.  Gait: Unassisted, Nonantalgic.   No images are attached to the encounter.  Radiographs:  Date: 03/23/2022 XR the right foot Weightbearing AP/Lateral/Oblique   Findings: no fracture, dislocation, swelling or degenerative changes noted.  Haglund's deformity is present with increased osseous prominence of the posterior superior aspect of the calcaneus.  Plantar heel spur is present but posterior spur at the insertion of the Achilles is absent. Assessment:   1. Achilles tendinitis of right lower extremity   2. Pain of right heel      Plan:  Patient was evaluated and treated and all questions answered.  #Insertional Achilles tendinitis versus right lower extremity sciatica/neuropathic pain -Discussed with the patient that he does have some evidence of Haglund's deformity and this could be resulting in Achilles tendinitis just proximal to the insertion point of the Achilles. -Recommend we proceed with a steroid Dosepak to see if this calms down some the inflammation and pain he is having the area. -E- Rx for methylprednisolone 4 mg Dosepak take as directed for 6 days -I also advised stretching exercises heel lifts and shoe gear modification to include a shoe with a thick heel and high heel-to-toe drop. -Patient will follow-up with his neurosurgeon to be  further evaluated to see if anything in his lower back could be contributing to the pain he is experiencing in the right foot. -Recommend anti-inflammatory medications as well if he is able to take them including ibuprofen or meloxicam.  Return if symptoms worsen or fail to improve.          Everitt Amber, DPM [facility]

## 2022-03-23 NOTE — Patient Instructions (Signed)
Achilles Tendinitis  with Rehab Achilles tendinitis is a disorder of the Achilles tendon. The Achilles tendon connects the large calf muscles (Gastrocnemius and Soleus) to the heel bone (calcaneus). This tendon is sometimes called the heel cord. It is important for pushing-off and standing on your toes and is important for walking, running, or jumping. Tendinitis is often caused by overuse and repetitive microtrauma. SYMPTOMS  Pain, tenderness, swelling, warmth, and redness may occur over the Achilles tendon even at rest.  Pain with pushing off, or flexing or extending the ankle.  Pain that is worsened after or during activity. CAUSES   Overuse sometimes seen with rapid increase in exercise programs or in sports requiring running and jumping.  Poor physical conditioning (strength and flexibility or endurance).  Running sports, especially training running down hills.  Inadequate warm-up before practice or play or failure to stretch before participation.  Injury to the tendon. PREVENTION   Warm up and stretch before practice or competition.  Allow time for adequate rest and recovery between practices and competition.  Keep up conditioning.  Keep up ankle and leg flexibility.  Improve or keep muscle strength and endurance.  Improve cardiovascular fitness.  Use proper technique.  Use proper equipment (shoes, skates).  To help prevent recurrence, taping, protective strapping, or an adhesive bandage may be recommended for several weeks after healing is complete. PROGNOSIS   Recovery may take weeks to several months to heal.  Longer recovery is expected if symptoms have been prolonged.  Recovery is usually quicker if the inflammation is due to a direct blow as compared with overuse or sudden strain. RELATED COMPLICATIONS   Healing time will be prolonged if the condition is not correctly treated. The injury must be given plenty of time to heal.  Symptoms can reoccur if  activity is resumed too soon.  Untreated, tendinitis may increase the risk of tendon rupture requiring additional time for recovery and possibly surgery. TREATMENT   The first treatment consists of rest anti-inflammatory medication, and ice to relieve the pain.  Stretching and strengthening exercises after resolution of pain will likely help reduce the risk of recurrence. Referral to a physical therapist or athletic trainer for further evaluation and treatment may be helpful.  A walking boot or cast may be recommended to rest the Achilles tendon. This can help break the cycle of inflammation and microtrauma.  Arch supports (orthotics) may be prescribed or recommended by your caregiver as an adjunct to therapy and rest.  Surgery to remove the inflamed tendon lining or degenerated tendon tissue is rarely necessary and has shown less than predictable results. MEDICATION   Nonsteroidal anti-inflammatory medications, such as aspirin and ibuprofen, may be used for pain and inflammation relief. Do not take within 7 days before surgery. Take these as directed by your caregiver. Contact your caregiver immediately if any bleeding, stomach upset, or signs of allergic reaction occur. Other minor pain relievers, such as acetaminophen, may also be used.  Pain relievers may be prescribed as necessary by your caregiver. Do not take prescription pain medication for longer than 4 to 7 days. Use only as directed and only as much as you need. HEAT AND COLD  Cold is used to relieve pain and reduce inflammation for acute and chronic Achilles tendinitis. Cold should be applied for 10 to 15 minutes every 2 to 3 hours for inflammation and pain and immediately after any activity that aggravates your symptoms. Use ice packs or an ice massage.  Heat may be used   before performing stretching and strengthening activities prescribed by your caregiver. Use a heat pack or a warm soak. SEEK MEDICAL CARE IF:  Symptoms get  worse or do not improve in 2 weeks despite treatment.  New, unexplained symptoms develop. Drugs used in treatment may produce side effects.   EXERCISES-- hold each stretch for 30 seconds and repeat 10 times.  Complete each stretch 3 times per day.   RANGE OF MOTION (ROM) AND STRETCHING EXERCISES - Achilles Tendinitis  These exercises may help you when beginning to rehabilitate your injury. Your symptoms may resolve with or without further involvement from your physician, physical therapist or athletic trainer. While completing these exercises, remember:   Restoring tissue flexibility helps normal motion to return to the joints. This allows healthier, less painful movement and activity.  An effective stretch should be held for at least 30 seconds.  A stretch should never be painful. You should only feel a gentle lengthening or release in the stretched tissue. STRETCH  Gastroc, Standing   Place hands on wall.  Extend right / left leg, keeping the front knee somewhat bent.  Slightly point your toes inward on your back foot.  Keeping your right / left heel on the floor and your knee straight, shift your weight toward the wall, not allowing your back to arch.  You should feel a gentle stretch in the right / left calf. Hold this position for __________ seconds. Repeat __________ times. Complete this stretch __________ times per day. STRETCH  Soleus, Standing   Place hands on wall.  Extend right / left leg, keeping the other knee somewhat bent.  Slightly point your toes inward on your back foot.  Keep your right / left heel on the floor, bend your back knee, and slightly shift your weight over the back leg so that you feel a gentle stretch deep in your back calf.  Hold this position for __________ seconds. Repeat __________ times. Complete this stretch __________ times per day. STRETCH  Gastrocsoleus, Standing  Note: This exercise can place a lot of stress on your foot and ankle.  Please complete this exercise only if specifically instructed by your caregiver.   Place the ball of your right / left foot on a step, keeping your other foot firmly on the same step.  Hold on to the wall or a rail for balance.  Slowly lift your other foot, allowing your body weight to press your heel down over the edge of the step.  You should feel a stretch in your right / left calf.  Hold this position for __________ seconds.  Repeat this exercise with a slight bend in your knee. Repeat __________ times. Complete this stretch __________ times per day.  STRENGTHENING EXERCISES - Achilles Tendinitis These exercises may help you when beginning to rehabilitate your injury. They may resolve your symptoms with or without further involvement from your physician, physical therapist or athletic trainer. While completing these exercises, remember:   Muscles can gain both the endurance and the strength needed for everyday activities through controlled exercises.  Complete these exercises as instructed by your physician, physical therapist or athletic trainer. Progress the resistance and repetitions only as guided.  You may experience muscle soreness or fatigue, but the pain or discomfort you are trying to eliminate should never worsen during these exercises. If this pain does worsen, stop and make certain you are following the directions exactly. If the pain is still present after adjustments, discontinue the exercise until you can discuss   the trouble with your clinician. STRENGTH - Plantar-flexors   Sit with your right / left leg extended. Holding onto both ends of a rubber exercise band/tubing, loop it around the ball of your foot. Keep a slight tension in the band.  Slowly push your toes away from you, pointing them downward.  Hold this position for __________ seconds. Return slowly, controlling the tension in the band/tubing. Repeat __________ times. Complete this exercise __________ times  per day.  STRENGTH - Plantar-flexors   Stand with your feet shoulder width apart. Steady yourself with a wall or table using as little support as needed.  Keeping your weight evenly spread over the width of your feet, rise up on your toes.*  Hold this position for __________ seconds. Repeat __________ times. Complete this exercise __________ times per day.  *If this is too easy, shift your weight toward your right / left leg until you feel challenged. Ultimately, you may be asked to do this exercise with your right / left foot only. STRENGTH  Plantar-flexors, Eccentric  Note: This exercise can place a lot of stress on your foot and ankle. Please complete this exercise only if specifically instructed by your caregiver.   Place the balls of your feet on a step. With your hands, use only enough support from a wall or rail to keep your balance.  Keep your knees straight and rise up on your toes.  Slowly shift your weight entirely to your right / left toes and pick up your opposite foot. Gently and with controlled movement, lower your weight through your right / left foot so that your heel drops below the level of the step. You will feel a slight stretch in the back of your calf at the end position.  Use the healthy leg to help rise up onto the balls of both feet, then lower weight only on the right / left leg again. Build up to 15 repetitions. Then progress to 3 consecutive sets of 15 repetitions.*  After completing the above exercise, complete the same exercise with a slight knee bend (about 30 degrees). Again, build up to 15 repetitions. Then progress to 3 consecutive sets of 15 repetitions.* Perform this exercise __________ times per day.  *When you easily complete 3 sets of 15, your physician, physical therapist or athletic trainer may advise you to add resistance by wearing a backpack filled with additional weight. STRENGTH - Plantar Flexors, Seated   Sit on a chair that allows your feet  to rest flat on the ground. If necessary, sit at the edge of the chair.  Keeping your toes firmly on the ground, lift your right / left heel as far as you can without increasing any discomfort in your ankle. Repeat __________ times. Complete this exercise __________ times a day. *If instructed by your physician, physical therapist or athletic trainer, you may add ____________________ of resistance by placing a weighted object on your right / left knee. Document Released: 12/30/2004 Document Revised: 08/23/2011 Document Reviewed: 09/12/2008 [employer] Patient Information 2014 [employer].    

## 2022-04-15 DIAGNOSIS — M48061 Spinal stenosis, lumbar region without neurogenic claudication: Secondary | ICD-10-CM | POA: Diagnosis not present

## 2022-04-15 DIAGNOSIS — D3610 Benign neoplasm of peripheral nerves and autonomic nervous system, unspecified: Secondary | ICD-10-CM | POA: Diagnosis not present

## 2022-04-15 DIAGNOSIS — D334 Benign neoplasm of spinal cord: Secondary | ICD-10-CM | POA: Diagnosis not present

## 2022-04-24 ENCOUNTER — Other Ambulatory Visit: Payer: Self-pay | Admitting: Family Medicine

## 2022-04-29 DIAGNOSIS — M47816 Spondylosis without myelopathy or radiculopathy, lumbar region: Secondary | ICD-10-CM | POA: Diagnosis not present

## 2022-04-29 DIAGNOSIS — M48061 Spinal stenosis, lumbar region without neurogenic claudication: Secondary | ICD-10-CM | POA: Diagnosis not present

## 2022-04-29 DIAGNOSIS — M47818 Spondylosis without myelopathy or radiculopathy, sacral and sacrococcygeal region: Secondary | ICD-10-CM | POA: Diagnosis not present

## 2022-05-18 IMAGING — MR MR CERVICAL SPINE W/O CM
4 of 5 series · 31 of 48 positions shown · non-contrast
Comparison: Report MRI cervical spine from 11/17/2015. Cervical
spine radiographs 06/24/2021

CLINICAL DATA: Right upper extremity numbness and right arm pain
worsening over the last 6 months. Stiffness in the neck.

EXAM:
MRI CERVICAL SPINE WITHOUT CONTRAST
TECHNIQUE: Multiplanar, multisequence MR imaging of the cervical spine was
performed. No intravenous contrast was administered.

[Series 2: T2 · sagittal · 3.0mm · 0.69mm/px · 6 of 15 slices shown (1 of 3)]
[im 1/15]
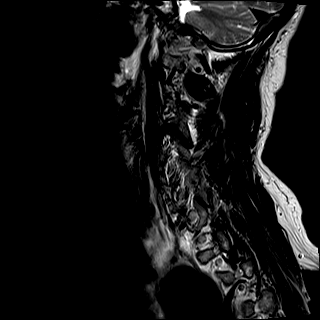
[im 3/15]
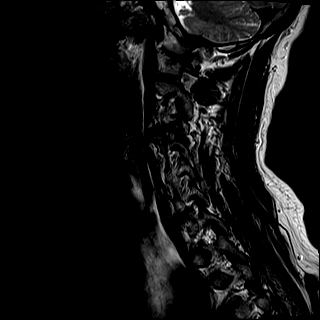
[im 6/15]
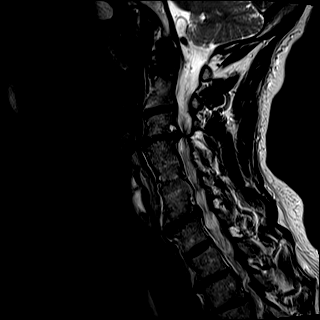
[im 9/15]
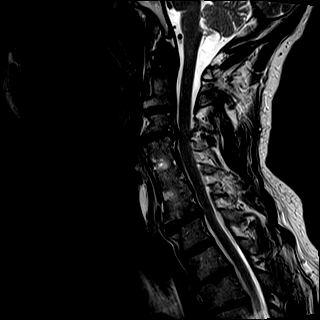
[im 12/15]
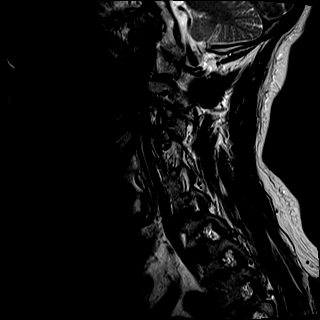
[im 15/15]
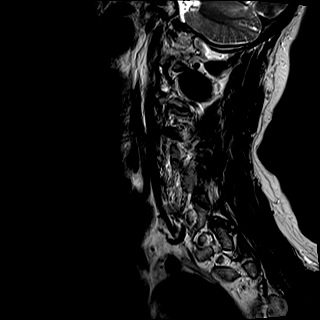

[Series 3: T1 · sagittal · 3.0mm · 0.69mm/px · 5 of 15 slices shown]
[im 1/15]
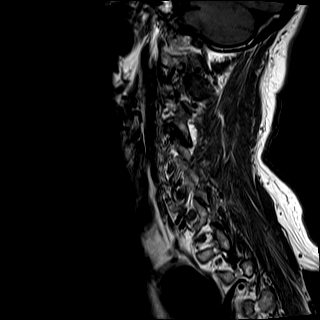
[im 4/15]
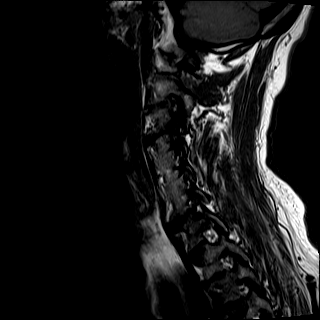
[im 8/15]
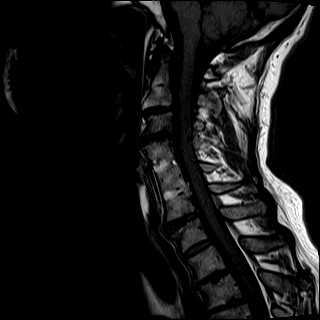
[im 11/15]
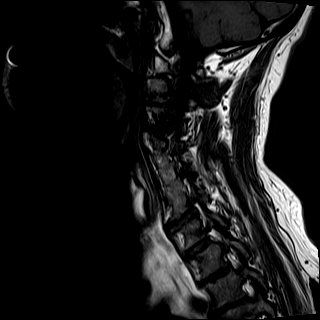
[im 15/15]
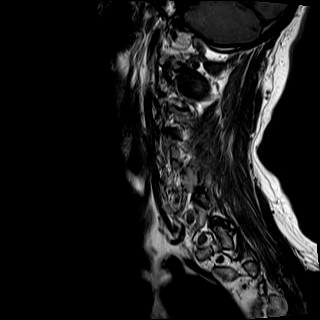

[Series 5: T2 · axial · 3.0mm · 0.62mm/px · z∈[-32,+109]mm · 10 of 44 slices shown (2 of 3)]
[im 3/44]
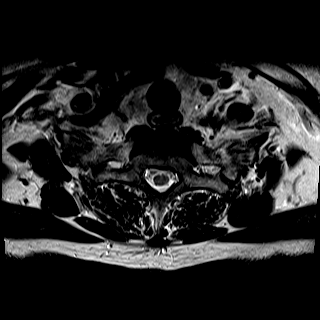
[im 6/44]
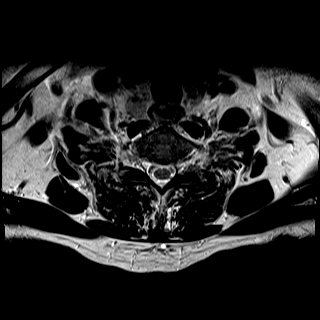
[im 9/44]
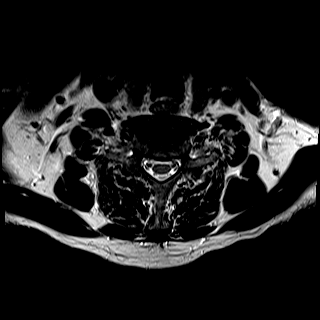
[im 15/44]
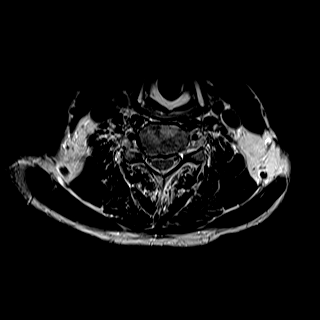
[im 21/44]
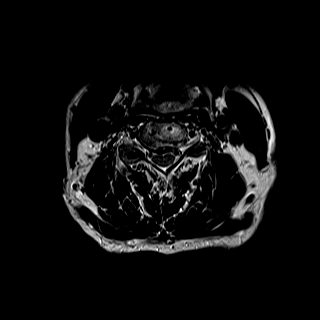
[im 23/44]
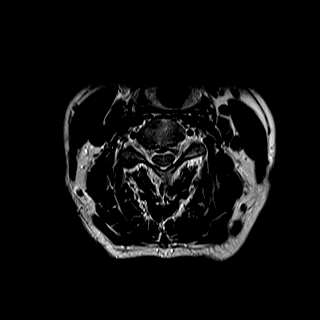
[im 26/44]
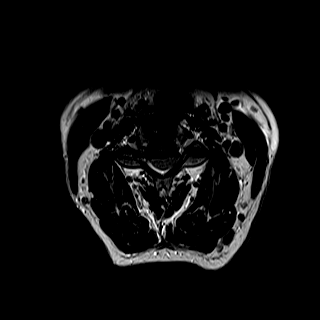
[im 32/44]
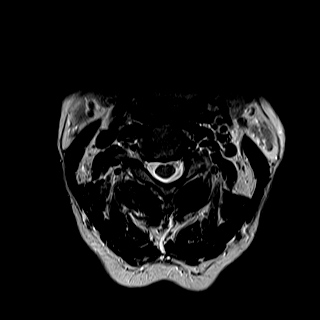
[im 38/44]
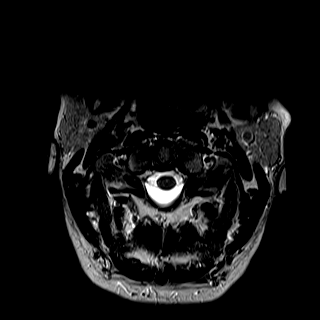
[im 44/44]
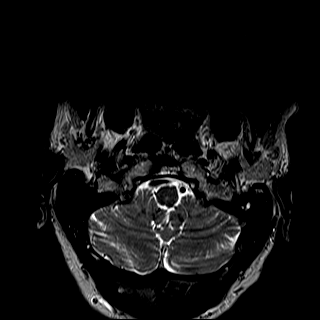

[Series 6: T2 · axial · 3.0mm · 0.39mm/px · z∈[-32,+109]mm · 10 of 44 slices shown (3 of 3)]
[im 3/44]
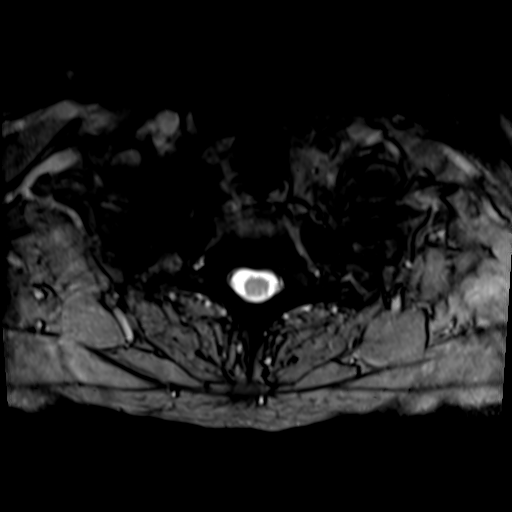
[im 6/44]
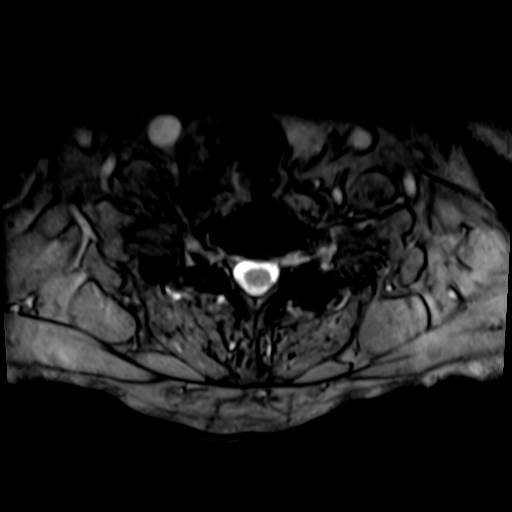
[im 9/44]
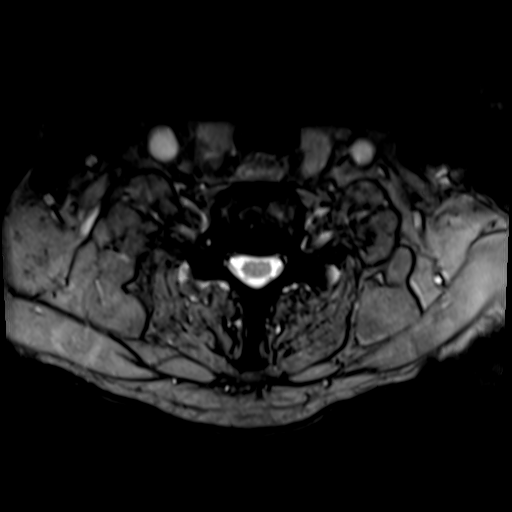
[im 15/44]
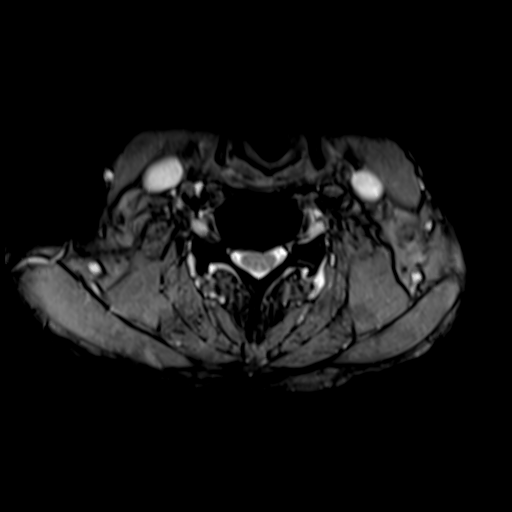
[im 21/44]
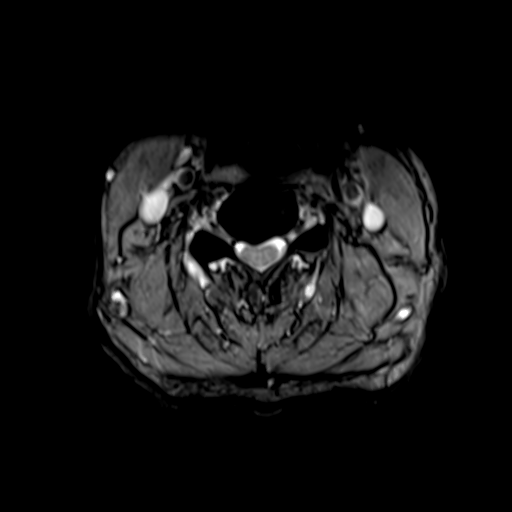
[im 23/44]
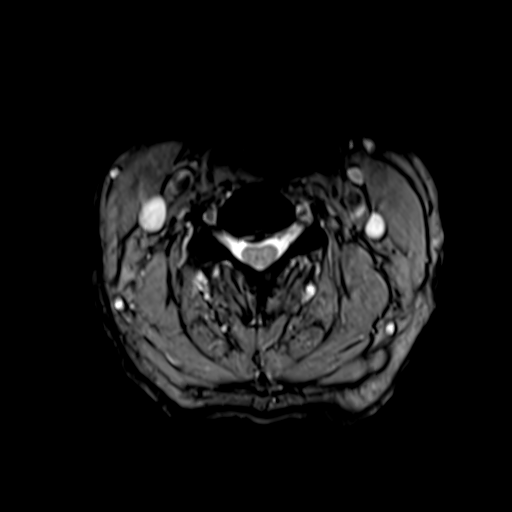
[im 26/44]
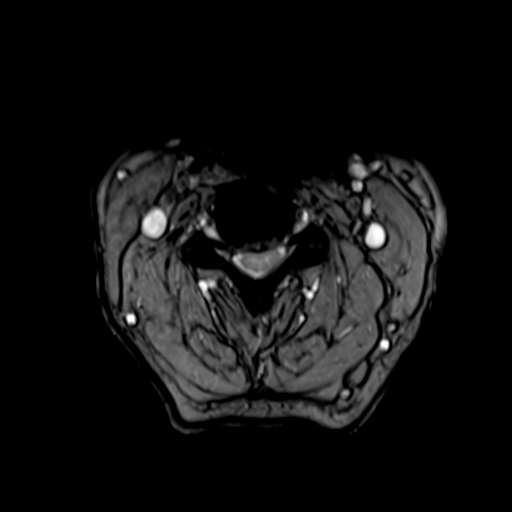
[im 32/44]
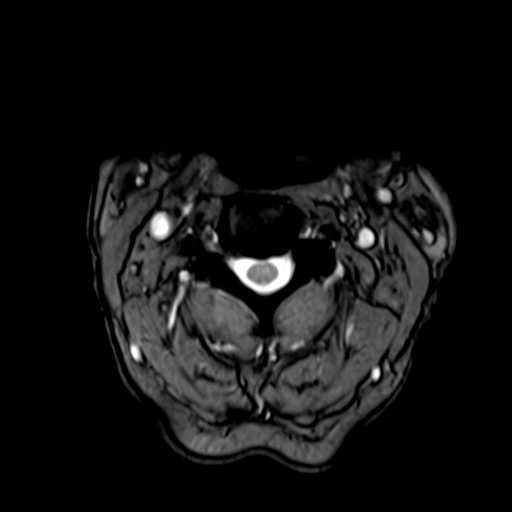
[im 38/44]
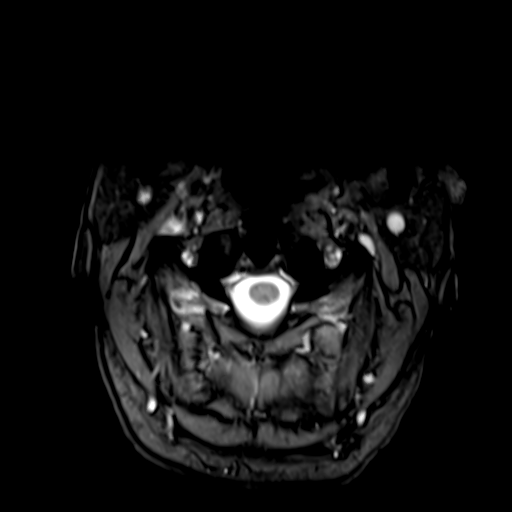
[im 44/44]
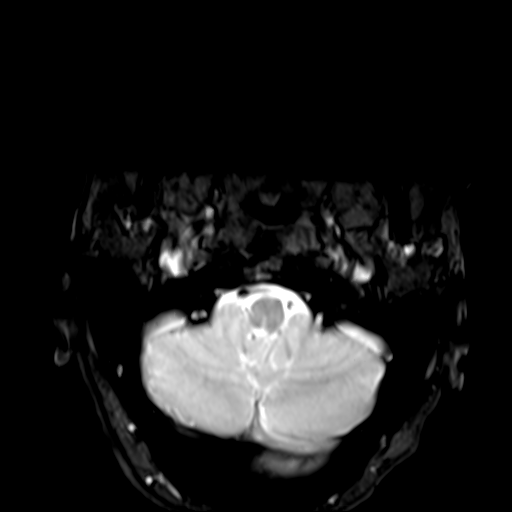

[31 of 48 positions shown; findings below may reference images not displayed]

FINDINGS: Alignment: 1.5 mm degenerative posterior subluxation at C3-4.

Vertebrae: Solid interbody fusion at C4-C5-C6. Type 1 degenerative
endplate findings at C3-4 prominent loss of disc height at C3-4 and
moderate loss of disc height at C2-3, C6-7, C7-T1, T1-2, and T2-3.
Mildly congenitally short pedicles in the cervical spine.

Cord: No significant abnormal spinal cord signal is observed.

Posterior Fossa, vertebral arteries, paraspinal tissues:
Unremarkable

Disc levels:

C2-3: Prominent bilateral foraminal stenosis due to facet and
uncinate spurring along disc bulge. Borderline central narrowing of
the thecal sac.

C3-4: Prominent bilateral foraminal stenosis and moderate central
narrowing of the thecal sac due to disc bulge, small central disc
protrusion, uncinate spurring, facet spurring. AP diameter of the
thecal sac is 0.7 cm at this level.

C4-5: Borderline central narrowing of the thecal sac due to short
pedicles.

C5-6: Borderline central narrowing of the thecal sac due to short
pedicles.

C6-7: Mild right and borderline left foraminal stenosis due to disc
bulge and uncinate spurring. Borderline central narrowing of the
thecal sac.

C7-T1: No impingement.  Left facet arthropathy.
IMPRESSION: 1. Cervical spondylosis, congenitally short pedicles, and
degenerative disc disease, causing prominent impingement at C2-3 and
C3-4, and mild impingement at C6-7, as detailed above.
2. Solid interbody fusion at C4-C5-C6.

## 2022-05-21 ENCOUNTER — Encounter: Payer: Self-pay | Admitting: Family Medicine

## 2022-05-21 ENCOUNTER — Ambulatory Visit (INDEPENDENT_AMBULATORY_CARE_PROVIDER_SITE_OTHER): Payer: Medicare Other | Admitting: Family Medicine

## 2022-05-21 VITALS — BP 134/86 | HR 72 | Temp 98.1°F | Ht 74.0 in | Wt 212.4 lb

## 2022-05-21 DIAGNOSIS — M545 Low back pain, unspecified: Secondary | ICD-10-CM | POA: Diagnosis not present

## 2022-05-21 DIAGNOSIS — M549 Dorsalgia, unspecified: Secondary | ICD-10-CM

## 2022-05-21 MED ORDER — TIZANIDINE HCL 4 MG PO TABS: 4.0000 mg | ORAL_TABLET | Freq: Four times a day (QID) | ORAL | 0 refills | Status: DC | PRN

## 2022-05-21 NOTE — Patient Instructions (Addendum)
Heat (pad or rice pillow in microwave) over affected area, 10-15 minutes twice daily.   Ice/cold pack over area for 10-15 min twice daily.  OK to take Tylenol 1000 mg (2 extra strength tabs) or 975 mg (3 regular strength tabs) every 6 hours as needed.  OK to continue meloxicam.   Send me a message in 3-4 weeks if no better.   Let us know if you need anything.

## 2022-05-21 NOTE — Progress Notes (Signed)
Musculoskeletal Exam  Patient: Joseph [name] DOB: Oct 31, 1955  DOS: 05/21/2022  SUBJECTIVE:  Chief Complaint:   Chief Complaint  Patient presents with   Back Pain    Joseph [name] is a 66 y.o.  male for evaluation and treatment of back pain.   Onset:  2 weeks ago. No inj or change in activity.  Location: lower L Character:  shooting  Progression of issue:  is unchanged Associated symptoms: hurts when he moves No bruising, redness, swelling Denies bowel/bladder incontinence or weakness Treatment: to date has been ice, prescription NSAIDS, Pennsaid and heat.   Neurovascular symptoms: no  Past Medical History:  Diagnosis Date   Arthritis    GERD (gastroesophageal reflux disease)    Heart murmur    Mixed hyperlipidemia    Prediabetes    Rheumatic fever     Objective:  VITAL SIGNS: BP 134/86 (BP Location: Left Arm, Cuff Size: Normal)   Pulse 72   Temp 98.1 F (36.7 C) (Oral)   Ht '6\' 2"'$  (1.88 m)   Wt 212 lb 6 oz (96.3 kg)   SpO2 99%   BMI 27.27 kg/m  Constitutional: Well formed, well developed. No acute distress. HENT: Normocephalic, atraumatic.  Thorax & Lungs:  No accessory muscle use Musculoskeletal: low back.   Tenderness to palpation: yes over ES and paraps msc on L  Deformity: no Ecchymosis: no Straight leg test: negative for Poor hamstring flexibility b/l. Neurologic: Normal sensory function. No focal deficits noted. DTR's equal and symmetric in LE's. No clonus. Psychiatric: Normal mood. Age appropriate judgment and insight. Alert & oriented x 3.    Assessment:  Acute left-sided low back pain without sciatica - Plan: tiZANidine (ZANAFLEX) 4 MG tablet  Acute mid back pain - Plan: tiZANidine (ZANAFLEX) 4 MG tablet  Plan: Stretches/exercises, heat, ice, Tylenol, NSAIDs. Zanaflex prn. PT if no better.  F/u prn. The patient voiced understanding and agreement to the plan.   [place], DO 05/21/22  1:34 PM

## 2022-05-26 ENCOUNTER — Other Ambulatory Visit: Payer: Self-pay | Admitting: Family Medicine

## 2022-05-27 DIAGNOSIS — M9904 Segmental and somatic dysfunction of sacral region: Secondary | ICD-10-CM | POA: Diagnosis not present

## 2022-05-27 DIAGNOSIS — M9902 Segmental and somatic dysfunction of thoracic region: Secondary | ICD-10-CM | POA: Diagnosis not present

## 2022-05-27 DIAGNOSIS — M5417 Radiculopathy, lumbosacral region: Secondary | ICD-10-CM | POA: Diagnosis not present

## 2022-05-27 DIAGNOSIS — M9903 Segmental and somatic dysfunction of lumbar region: Secondary | ICD-10-CM | POA: Diagnosis not present

## 2022-05-27 DIAGNOSIS — M5386 Other specified dorsopathies, lumbar region: Secondary | ICD-10-CM | POA: Diagnosis not present

## 2022-05-27 DIAGNOSIS — M5415 Radiculopathy, thoracolumbar region: Secondary | ICD-10-CM | POA: Diagnosis not present

## 2022-05-31 ENCOUNTER — Other Ambulatory Visit: Payer: Self-pay | Admitting: Family Medicine

## 2022-05-31 DIAGNOSIS — M549 Dorsalgia, unspecified: Secondary | ICD-10-CM

## 2022-05-31 DIAGNOSIS — M545 Low back pain, unspecified: Secondary | ICD-10-CM

## 2022-06-01 DIAGNOSIS — M5386 Other specified dorsopathies, lumbar region: Secondary | ICD-10-CM | POA: Diagnosis not present

## 2022-06-01 DIAGNOSIS — M5417 Radiculopathy, lumbosacral region: Secondary | ICD-10-CM | POA: Diagnosis not present

## 2022-06-01 DIAGNOSIS — M9903 Segmental and somatic dysfunction of lumbar region: Secondary | ICD-10-CM | POA: Diagnosis not present

## 2022-06-01 DIAGNOSIS — M9902 Segmental and somatic dysfunction of thoracic region: Secondary | ICD-10-CM | POA: Diagnosis not present

## 2022-06-01 DIAGNOSIS — M5415 Radiculopathy, thoracolumbar region: Secondary | ICD-10-CM | POA: Diagnosis not present

## 2022-06-01 DIAGNOSIS — M9904 Segmental and somatic dysfunction of sacral region: Secondary | ICD-10-CM | POA: Diagnosis not present

## 2022-06-02 DIAGNOSIS — M5415 Radiculopathy, thoracolumbar region: Secondary | ICD-10-CM | POA: Diagnosis not present

## 2022-06-02 DIAGNOSIS — M9903 Segmental and somatic dysfunction of lumbar region: Secondary | ICD-10-CM | POA: Diagnosis not present

## 2022-06-02 DIAGNOSIS — M5386 Other specified dorsopathies, lumbar region: Secondary | ICD-10-CM | POA: Diagnosis not present

## 2022-06-02 DIAGNOSIS — M9904 Segmental and somatic dysfunction of sacral region: Secondary | ICD-10-CM | POA: Diagnosis not present

## 2022-06-02 DIAGNOSIS — M9902 Segmental and somatic dysfunction of thoracic region: Secondary | ICD-10-CM | POA: Diagnosis not present

## 2022-06-02 DIAGNOSIS — M5417 Radiculopathy, lumbosacral region: Secondary | ICD-10-CM | POA: Diagnosis not present

## 2022-06-15 DIAGNOSIS — M5386 Other specified dorsopathies, lumbar region: Secondary | ICD-10-CM | POA: Diagnosis not present

## 2022-06-15 DIAGNOSIS — M5415 Radiculopathy, thoracolumbar region: Secondary | ICD-10-CM | POA: Diagnosis not present

## 2022-06-15 DIAGNOSIS — M9903 Segmental and somatic dysfunction of lumbar region: Secondary | ICD-10-CM | POA: Diagnosis not present

## 2022-06-15 DIAGNOSIS — M5417 Radiculopathy, lumbosacral region: Secondary | ICD-10-CM | POA: Diagnosis not present

## 2022-06-15 DIAGNOSIS — M9902 Segmental and somatic dysfunction of thoracic region: Secondary | ICD-10-CM | POA: Diagnosis not present

## 2022-06-15 DIAGNOSIS — M9904 Segmental and somatic dysfunction of sacral region: Secondary | ICD-10-CM | POA: Diagnosis not present

## 2022-06-29 ENCOUNTER — Encounter: Payer: Self-pay | Admitting: Family Medicine

## 2022-07-05 ENCOUNTER — Ambulatory Visit (INDEPENDENT_AMBULATORY_CARE_PROVIDER_SITE_OTHER): Payer: Medicare Other | Admitting: Family Medicine

## 2022-07-05 ENCOUNTER — Encounter: Payer: Self-pay | Admitting: Family Medicine

## 2022-07-05 VITALS — BP 156/98 | HR 65 | Temp 97.9°F | Ht 74.0 in | Wt 215.4 lb

## 2022-07-05 DIAGNOSIS — I10 Essential (primary) hypertension: Secondary | ICD-10-CM

## 2022-07-05 DIAGNOSIS — K429 Umbilical hernia without obstruction or gangrene: Secondary | ICD-10-CM | POA: Diagnosis not present

## 2022-07-05 MED ORDER — AMLODIPINE BESYLATE 5 MG PO TABS: 5.0000 mg | ORAL_TABLET | Freq: Every day | ORAL | 3 refills | Status: DC

## 2022-07-05 NOTE — Patient Instructions (Addendum)
Keep the diet clean and stay active.  Check your blood pressures 2-3 times per week, alternating the time of day you check it. If it is high, considering waiting 1-2 minutes and rechecking. If it gets higher, your anxiety is likely creeping up and we should avoid rechecking.   If you change your mind about seeing a surgeon for the hernia, send me a message and we will place a referral.   Let us know if you need anything.

## 2022-07-05 NOTE — Progress Notes (Signed)
Chief Complaint  Patient presents with   Hernia   Hypertension    Joseph [name] is a 67 y.o. male here for a skin complaint.  Duration: several years Location: bellybutton Pruritic? No Painful? Yes Drainage? No New soaps/lotions/topicals/detergents? No Trauma? No Other associated symptoms: seems to be better bigger Therapies tried thus far: none  Hypertension Patient presents for hypertension follow up. He does monitor home blood pressures. Blood pressures ranging on average from 150's/100's. He is not on any medication.  He is usually adhering to a healthy diet overall. Exercise: walking No Cp or SOB.   Past Medical History:  Diagnosis Date   Arthritis    GERD (gastroesophageal reflux disease)    Heart murmur    Mixed hyperlipidemia    Prediabetes    Rheumatic fever     BP (!) 156/98 (BP Location: Left Arm, Cuff Size: Normal)   Pulse 65   Temp 97.9 F (36.6 C) (Oral)   Ht '6\' 2"'$  (1.88 m)   Wt 215 lb 6 oz (97.7 kg)   SpO2 99%   BMI 27.65 kg/m  Gen: awake, alert, appearing stated age Heart: RRR, no bruits, no lower extremity edema Abdomen: Bowel sounds present, soft, nontender, nondistended, there is a reducible hernia in the umbilicus region without TTP, skin color changes, or erythema Lungs: CTAB. No accessory muscle use Psych: Age appropriate judgment and insight  Umbilical hernia without obstruction and without gangrene  Essential hypertension - Plan: amLODipine (NORVASC) 5 MG tablet  Offered referral to general surgery which he will think about and perhaps take me up on later. Chronic, uncontrolled.  Start amlodipine 5 mg daily.  Monitor blood pressure at home.  Counseled on diet and exercise.  Follow-up in 1 month.  Bring blood pressure monitor to appointment to validate. The patient voiced understanding and agreement to the plan.  [place], DO 07/05/22 8:51 AM

## 2022-07-24 ENCOUNTER — Other Ambulatory Visit: Payer: Self-pay | Admitting: Family Medicine

## 2022-08-04 ENCOUNTER — Encounter: Payer: Self-pay | Admitting: Family Medicine

## 2022-08-04 ENCOUNTER — Ambulatory Visit (INDEPENDENT_AMBULATORY_CARE_PROVIDER_SITE_OTHER): Payer: Medicare Other | Admitting: Family Medicine

## 2022-08-04 VITALS — BP 130/84 | HR 71 | Temp 97.5°F | Ht 74.0 in | Wt 215.4 lb

## 2022-08-04 DIAGNOSIS — I10 Essential (primary) hypertension: Secondary | ICD-10-CM | POA: Diagnosis not present

## 2022-08-04 DIAGNOSIS — H539 Unspecified visual disturbance: Secondary | ICD-10-CM | POA: Diagnosis not present

## 2022-08-04 MED ORDER — AMLODIPINE BESYLATE 5 MG PO TABS: 5.0000 mg | ORAL_TABLET | Freq: Every day | ORAL | 2 refills | Status: DC

## 2022-08-04 NOTE — Progress Notes (Signed)
Chief Complaint  Patient presents with   Follow-up    Blood pressure medication is working     Subjective Joseph [name] is a 67 y.o. male who presents for hypertension follow up. He does monitor home blood pressures. Blood pressures ranging from 130's/80's on average. He is compliant with medication- Norvasc 5 mg/d. Patient has these side effects of medication: none He is sometimes adhering to a healthy diet overall. Current exercise: walking No CP or SOB.   2 yrs ago had orbital cellulitis of L eye. Slight vision loss of L eye compared to R. No pain or drainage. Interested in seeing a specialist.  No Cp or SOB.   Past Medical History:  Diagnosis Date   Arthritis    GERD (gastroesophageal reflux disease)    Heart murmur    Mixed hyperlipidemia    Prediabetes    Rheumatic fever     Exam BP 130/84 (BP Location: Left Arm, Patient Position: Sitting, Cuff Size: Normal)   Pulse 71   Temp (!) 97.5 F (36.4 C) (Oral)   Ht 6' 2"$  (1.88 m)   Wt 215 lb 6 oz (97.7 kg)   SpO2 98%   BMI 27.65 kg/m  General:  well developed, well nourished, in no apparent distress Eyes: EOMi, Sclera white, PERRLA Heart: RRR, no bruits, no LE edema Lungs: clear to auscultation, no accessory muscle use Psych: well oriented with normal range of affect and appropriate judgment/insight  Changes in vision - Plan: Ambulatory referral to Ophthalmology  Essential hypertension - Plan: amLODipine (NORVASC) 5 MG tablet  Chronic, stable. Cont Norvasc 5 mg/d. OK to stop ck'ing BP. Counseled on diet and exercise. Refer to ophtho.  F/u in 6 mo. The patient voiced understanding and agreement to the plan.  [place], DO 08/04/22  9:18 AM

## 2022-08-04 NOTE — Patient Instructions (Addendum)
Try taking your Norvasc in the morning.  Keep the diet clean and stay active.  Because your blood pressure is well-controlled, you no longer have to check your blood pressure at home anymore unless you wish. Some people check it twice daily every day and some people stop altogether. Either or anything in between is fine. Strong work!  If you do not hear anything about your referral in the next 1-2 weeks, call our office and ask for an update.  Let us know if you need anything.

## 2022-09-20 ENCOUNTER — Encounter: Payer: Self-pay | Admitting: Family Medicine

## 2022-09-20 DIAGNOSIS — M545 Low back pain, unspecified: Secondary | ICD-10-CM

## 2022-09-20 DIAGNOSIS — I10 Essential (primary) hypertension: Secondary | ICD-10-CM

## 2022-09-20 MED ORDER — MELOXICAM 7.5 MG PO TABS: ORAL_TABLET | ORAL | 1 refills | Status: DC

## 2022-09-20 MED ORDER — ATORVASTATIN CALCIUM 40 MG PO TABS: 40.0000 mg | ORAL_TABLET | Freq: Every day | ORAL | 0 refills | Status: DC

## 2022-09-20 MED ORDER — AMLODIPINE BESYLATE 5 MG PO TABS: 5.0000 mg | ORAL_TABLET | Freq: Every day | ORAL | 2 refills | Status: DC

## 2022-10-26 ENCOUNTER — Other Ambulatory Visit: Payer: Self-pay | Admitting: Family Medicine

## 2022-11-05 ENCOUNTER — Other Ambulatory Visit: Payer: Self-pay | Admitting: Family Medicine

## 2023-01-13 ENCOUNTER — Other Ambulatory Visit: Payer: Self-pay | Admitting: Family Medicine

## 2023-01-14 DIAGNOSIS — M5413 Radiculopathy, cervicothoracic region: Secondary | ICD-10-CM | POA: Diagnosis not present

## 2023-01-14 DIAGNOSIS — M546 Pain in thoracic spine: Secondary | ICD-10-CM | POA: Diagnosis not present

## 2023-01-14 DIAGNOSIS — M5136 Other intervertebral disc degeneration, lumbar region: Secondary | ICD-10-CM | POA: Diagnosis not present

## 2023-01-14 DIAGNOSIS — M9901 Segmental and somatic dysfunction of cervical region: Secondary | ICD-10-CM | POA: Diagnosis not present

## 2023-01-14 DIAGNOSIS — M50323 Other cervical disc degeneration at C6-C7 level: Secondary | ICD-10-CM | POA: Diagnosis not present

## 2023-01-14 DIAGNOSIS — M62830 Muscle spasm of back: Secondary | ICD-10-CM | POA: Diagnosis not present

## 2023-01-14 DIAGNOSIS — M9902 Segmental and somatic dysfunction of thoracic region: Secondary | ICD-10-CM | POA: Diagnosis not present

## 2023-01-14 DIAGNOSIS — M9903 Segmental and somatic dysfunction of lumbar region: Secondary | ICD-10-CM | POA: Diagnosis not present

## 2023-01-18 DIAGNOSIS — M50323 Other cervical disc degeneration at C6-C7 level: Secondary | ICD-10-CM | POA: Diagnosis not present

## 2023-01-18 DIAGNOSIS — M9902 Segmental and somatic dysfunction of thoracic region: Secondary | ICD-10-CM | POA: Diagnosis not present

## 2023-01-18 DIAGNOSIS — M5136 Other intervertebral disc degeneration, lumbar region: Secondary | ICD-10-CM | POA: Diagnosis not present

## 2023-01-18 DIAGNOSIS — M546 Pain in thoracic spine: Secondary | ICD-10-CM | POA: Diagnosis not present

## 2023-01-18 DIAGNOSIS — M9903 Segmental and somatic dysfunction of lumbar region: Secondary | ICD-10-CM | POA: Diagnosis not present

## 2023-01-18 DIAGNOSIS — M5413 Radiculopathy, cervicothoracic region: Secondary | ICD-10-CM | POA: Diagnosis not present

## 2023-01-18 DIAGNOSIS — M9901 Segmental and somatic dysfunction of cervical region: Secondary | ICD-10-CM | POA: Diagnosis not present

## 2023-01-18 DIAGNOSIS — M62830 Muscle spasm of back: Secondary | ICD-10-CM | POA: Diagnosis not present

## 2023-01-19 ENCOUNTER — Encounter: Payer: Self-pay | Admitting: Orthopedic Surgery

## 2023-01-19 ENCOUNTER — Ambulatory Visit (INDEPENDENT_AMBULATORY_CARE_PROVIDER_SITE_OTHER): Payer: Medicare Other | Admitting: Orthopedic Surgery

## 2023-01-19 DIAGNOSIS — M792 Neuralgia and neuritis, unspecified: Secondary | ICD-10-CM | POA: Diagnosis not present

## 2023-01-19 NOTE — Addendum Note (Signed)
Addended byPrescott Parma on: 01/19/2023 01:56 PM   Modules accepted: Orders

## 2023-01-19 NOTE — Progress Notes (Signed)
Office Visit Note   Patient: Joseph [name]           Date of Birth: 06-Nov-1955           MRN: [id number] Visit Date: 01/19/2023 Requested by: Sharlene Dory, DO [address] [place],  [place] [address] PCP: Sharlene Dory, DO  Subjective: Chief Complaint  Patient presents with   Right Shoulder - Pain    HPI: Joseph [name] is a 67 y.o. male who presents to the office reporting continued right shoulder pain.  Patient underwent right shoulder replacement in [place] over 10 years ago.  Describes stiffness and some loss of strength.  Denies much in the way of radicular symptoms which go down the right arm.  Has had a cervical spine MRI which does show C2-3 disc osteophyte complex above prior level of fusion.  He is not having any fevers or chills.  Concerned about infection in the shoulder.  No history of injury..                ROS: All systems reviewed are negative as they relate to the chief complaint within the history of present illness.  Patient denies fevers or chills.  Assessment & Plan: Visit Diagnoses:  1. Radicular pain in right arm     Plan: Impression is right shoulder pain.  He does have stiffness in the shoulder.  External rotation and subscap strength is good.  Prior radiographs show the components to be in good position and alignment.  Plan at this time his blood work to evaluate for infection and CT scan of the shoulder to evaluate for loosening versus fluid collection.  Follow-up after those studies.  Follow-Up Instructions: No follow-ups on file.   Orders:  No orders of the defined types were placed in this encounter.  No orders of the defined types were placed in this encounter.     Procedures: No procedures performed   Clinical Data: No additional findings.  Objective: Vital Signs: There were no vitals taken for this visit.  Physical Exam:  Constitutional: Patient appears well-developed HEENT:  Head:  Normocephalic Eyes:EOM are normal Neck: Normal range of motion Cardiovascular: Normal rate Pulmonary/chest: Effort normal Neurologic: Patient is alert Skin: Skin is warm Psychiatric: Patient has normal mood and affect  Ortho Exam: Ortho exam demonstrates range of motion of that right shoulder 15/75/85.  Does have 5 out of 5 external rotation strength and subscap strength.  Shoulder itself is not warm.  Incision intact.  Deltoid is functional.  No lymphadenopathy in that shoulder girdle region.  No AC joint tenderness.  No coarseness or popping with internal extra rotation of that shoulder.  Specialty Comments:  No specialty comments available.  Imaging: No results found.   PMFS History: Patient Active Problem List   Diagnosis Date Noted   Umbilical hernia without obstruction and without gangrene 07/05/2022   Insulin resistance 09/03/2019   Cubital tunnel syndrome 07/16/2019   Localized, primary osteoarthritis of shoulder region 07/16/2019   Shoulder pain 07/16/2019   Low testosterone 04/13/2018   Mixed hyperlipidemia    Bilateral sensorineural hearing loss 11/01/2017   Subjective tinnitus of right ear 11/01/2017   Hyperlipidemia 09/14/2017   History of tobacco abuse 09/14/2017   Overweight (BMI 25.0-29.9) 09/14/2017   Vertigo 07/14/2016   Arthritis of hand 02/09/2016   Right hand pain 02/09/2016   Cervical spondylosis with myelopathy 01/15/2016   Lumbar facet arthropathy 12/12/2015   Lumbar stenosis 12/12/2015   Meralgia  paresthetica 12/12/2015   Neurofibroma 12/12/2015   Other intervertebral disc degeneration, lumbar region 12/12/2015   Dysphagia 10/31/2015   GERD (gastroesophageal reflux disease) 10/31/2015   Neuropathy 10/21/2015   Essential hypertension 02/11/2015   Shingles 07/10/2014   Low back pain 05/01/2014   Kidney stone 05/01/2014   Past Medical History:  Diagnosis Date   Arthritis    GERD (gastroesophageal reflux disease)    Heart murmur    Mixed  hyperlipidemia    Prediabetes    Rheumatic fever     Family History  Problem Relation Age of Onset   Arthritis Mother        Rheumatoid   Diabetes Mother    Arthritis Father    Hypertension Father    Diabetes Brother    Lupus Brother     Past Surgical History:  Procedure Laterality Date   BACK SURGERY  2012   JOINT REPLACEMENT Right 2013   SPINE SURGERY  1995   C4-5, C5-C6    TONSILLECTOMY  1963   Social History   Occupational History   Occupation: Disability    Comment: Shoulder / Back  Tobacco Use   Smoking status: Former    Current packs/day: 0.00    Average packs/day: 1.5 packs/day for 20.0 years (30.0 ttl pk-yrs)    Types: Cigarettes    Start date: 05/01/1970    Quit date: 05/01/1990    Years since quitting: 32.7   Smokeless tobacco: Never  Substance and Sexual Activity   Alcohol use: No    Alcohol/week: 0.0 standard drinks of alcohol   Drug use: No   Sexual activity: Yes    Partners: Female

## 2023-01-21 DIAGNOSIS — M546 Pain in thoracic spine: Secondary | ICD-10-CM | POA: Diagnosis not present

## 2023-01-21 DIAGNOSIS — M62830 Muscle spasm of back: Secondary | ICD-10-CM | POA: Diagnosis not present

## 2023-01-21 DIAGNOSIS — M9902 Segmental and somatic dysfunction of thoracic region: Secondary | ICD-10-CM | POA: Diagnosis not present

## 2023-01-21 DIAGNOSIS — M5136 Other intervertebral disc degeneration, lumbar region: Secondary | ICD-10-CM | POA: Diagnosis not present

## 2023-01-21 DIAGNOSIS — M9903 Segmental and somatic dysfunction of lumbar region: Secondary | ICD-10-CM | POA: Diagnosis not present

## 2023-01-21 DIAGNOSIS — M9901 Segmental and somatic dysfunction of cervical region: Secondary | ICD-10-CM | POA: Diagnosis not present

## 2023-01-21 DIAGNOSIS — M5413 Radiculopathy, cervicothoracic region: Secondary | ICD-10-CM | POA: Diagnosis not present

## 2023-01-21 DIAGNOSIS — M50323 Other cervical disc degeneration at C6-C7 level: Secondary | ICD-10-CM | POA: Diagnosis not present

## 2023-02-04 ENCOUNTER — Other Ambulatory Visit: Payer: PRIVATE HEALTH INSURANCE

## 2023-02-07 ENCOUNTER — Ambulatory Visit
Admission: RE | Admit: 2023-02-07 | Discharge: 2023-02-07 | Disposition: A | Discharge status: Home / Self Care | Payer: Medicare Other | Source: Ambulatory Visit | Attending: Orthopedic Surgery | Admitting: Orthopedic Surgery

## 2023-02-07 DIAGNOSIS — M25511 Pain in right shoulder: Secondary | ICD-10-CM | POA: Diagnosis not present

## 2023-02-07 DIAGNOSIS — Z96611 Presence of right artificial shoulder joint: Secondary | ICD-10-CM | POA: Diagnosis not present

## 2023-02-07 DIAGNOSIS — M792 Neuralgia and neuritis, unspecified: Secondary | ICD-10-CM

## 2023-03-02 ENCOUNTER — Ambulatory Visit (INDEPENDENT_AMBULATORY_CARE_PROVIDER_SITE_OTHER): Payer: Medicare Other | Admitting: Orthopedic Surgery

## 2023-03-02 ENCOUNTER — Encounter: Payer: Self-pay | Admitting: Orthopedic Surgery

## 2023-03-02 DIAGNOSIS — M19019 Primary osteoarthritis, unspecified shoulder: Secondary | ICD-10-CM | POA: Diagnosis not present

## 2023-03-02 MED ORDER — TRAMADOL HCL 50 MG PO TABS
50.0000 mg | ORAL_TABLET | Freq: Two times a day (BID) | ORAL | 0 refills | Status: DC | PRN
Start: 2023-03-02 — End: 2023-04-30

## 2023-03-02 NOTE — Progress Notes (Signed)
Office Visit Note   Patient: Joseph [name]           Date of Birth: August 12, 1955           MRN: [id number] Visit Date: 03/02/2023 Requested by: Sharlene Dory, DO [address] [place],  [place] [address] PCP: Sharlene Dory, DO  Subjective: Chief Complaint  Patient presents with   Other     Review CT scan    HPI: Krystal Muratalla is a 67 y.o. male who presents to the office reporting right shoulder pain and stiffness.  States that the shoulder is sore.  He has a history of total shoulder replacement in 2013 in [place].  Does report some weakness.  Denies any fevers or chills.  Did have lab work done earlier this summer which was negative.  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  He describes having had multiple dislocations for which she had surgery in the 70s.  Subsequently developed arthritis in the shoulder.  Patient states that pain medications do not really work for him.  He has never tried tramadol.  Takes anti-inflammatories only.  Also has a history of esophageal stricture.  He is retired.  Here with his wife today.  CT scan is reviewed and it does show cystic changes consistent with likely loosening of that glenoid prosthesis.  There is also some erosive changes in the metaphysis of the humerus which could also be consistent with particle disease..                ROS: All systems reviewed are negative as they relate to the chief complaint within the history of present illness.  Patient denies fevers or chills.  Assessment & Plan: Visit Diagnoses:  1. Shoulder arthritis     Plan: Impression is stiffness in the right shoulder with active forward flexion of about 90.  More concerning is the loosening and cystic degeneration within the glenoid component of his total shoulder replacement.  This is something that we discussed extensively in terms of operative and nonoperative treatment options.  In  general I think he is going to have to modify his expectations in terms of being able to do physical work with that right shoulder if he undergoes revision surgery or if he just continues with how he is doing.  Revision surgery would entail either 1 or two-stage procedure.  Need to find out exactly what kind of prosthesis and whether its modular or not that is in the humerus.  Bone grafting would be required for the glenoid and whether or not a standard implant could fit within the bone grafting glenoid versus potentially bone grafting and then coming back for later implantation is hard to say.  That would have to be a game time decision.  For now I am going to determine the nodularity aspect of his humeral stem which was placed 11 years ago.  Risk and benefits of surgical revision discussed.  His main concern now is that he does not want what he has to get infected but he would be at an increased risk of infection if he undergoes yet another surgery on his shoulder.  I will call him with what I find out about the implant.  Follow-Up Instructions: No follow-ups on file.   Orders:  No orders of the defined types were placed in this encounter.  No orders of the defined types were placed in this encounter.  Procedures: No procedures performed   Clinical Data: No additional findings.  Objective: Vital Signs: There were no vitals taken for this visit.  Physical Exam:  Constitutional: Patient appears well-developed HEENT:  Head: Normocephalic Eyes:EOM are normal Neck: Normal range of motion Cardiovascular: Normal rate Pulmonary/chest: Effort normal Neurologic: Patient is alert Skin: Skin is warm Psychiatric: Patient has normal mood and affect  Ortho Exam: Ortho exam demonstrates multiple surgical incisions on the right shoulder.  Deltoid does fire.  Rotator cuff strength is actually pretty good to external rotation at 5 out of 5 and subscap strength also 5 out of 5.  No masses  lymphadenopathy or skin changes noted in that shoulder girdle region.  Radial pulse intact.  Specialty Comments:  No specialty comments available.  Imaging: No results found.   PMFS History: Patient Active Problem List   Diagnosis Date Noted   Umbilical hernia without obstruction and without gangrene 07/05/2022   Insulin resistance 09/03/2019   Cubital tunnel syndrome 07/16/2019   Localized, primary osteoarthritis of shoulder region 07/16/2019   Shoulder pain 07/16/2019   Low testosterone 04/13/2018   Mixed hyperlipidemia    Bilateral sensorineural hearing loss 11/01/2017   Subjective tinnitus of right ear 11/01/2017   Hyperlipidemia 09/14/2017   History of tobacco abuse 09/14/2017   Overweight (BMI 25.0-29.9) 09/14/2017   Vertigo 07/14/2016   Arthritis of hand 02/09/2016   Right hand pain 02/09/2016   Cervical spondylosis with myelopathy 01/15/2016   Lumbar facet arthropathy 12/12/2015   Lumbar stenosis 12/12/2015   Meralgia paresthetica 12/12/2015   Neurofibroma 12/12/2015   Other intervertebral disc degeneration, lumbar region 12/12/2015   Dysphagia 10/31/2015   GERD (gastroesophageal reflux disease) 10/31/2015   Neuropathy 10/21/2015   Essential hypertension 02/11/2015   Shingles 07/10/2014   Low back pain 05/01/2014   Kidney stone 05/01/2014   Past Medical History:  Diagnosis Date   Arthritis    GERD (gastroesophageal reflux disease)    Heart murmur    Mixed hyperlipidemia    Prediabetes    Rheumatic fever     Family History  Problem Relation Age of Onset   Arthritis Mother        Rheumatoid   Diabetes Mother    Arthritis Father    Hypertension Father    Diabetes Brother    Lupus Brother     Past Surgical History:  Procedure Laterality Date   BACK SURGERY  2012   JOINT REPLACEMENT Right 2013   SPINE SURGERY  1995   C4-5, C5-C6    TONSILLECTOMY  1963   Social History   Occupational History   Occupation: Disability    Comment: Shoulder / Back   Tobacco Use   Smoking status: Former    Current packs/day: 0.00    Average packs/day: 1.5 packs/day for 20.0 years (30.0 ttl pk-yrs)    Types: Cigarettes    Start date: 05/01/1970    Quit date: 05/01/1990    Years since quitting: 32.8   Smokeless tobacco: Never  Substance and Sexual Activity   Alcohol use: No    Alcohol/week: 0.0 standard drinks of alcohol   Drug use: No   Sexual activity: Yes    Partners: Female

## 2023-03-02 NOTE — Addendum Note (Signed)
Addended by: Rise Paganini on: 03/02/2023 12:28 PM   Modules accepted: Orders

## 2023-03-05 ENCOUNTER — Other Ambulatory Visit: Payer: Self-pay | Admitting: Family Medicine

## 2023-03-05 DIAGNOSIS — M545 Low back pain, unspecified: Secondary | ICD-10-CM

## 2023-03-11 ENCOUNTER — Telehealth: Payer: Self-pay | Admitting: Orthopedic Surgery

## 2023-03-11 NOTE — Telephone Encounter (Signed)
Patient called regarding left shoulder surgery.  He would like to set something up for November if possible. Notes indicate this could be a two-stage procedure.  Please provide surgery sheet if surgery is in order.  Patient did say he would like to be seen again before having the surgery.   Patient can be reached at [phone]

## 2023-03-14 NOTE — Telephone Encounter (Signed)
Done thx pls arrange for another appt 1 month thx

## 2023-03-15 ENCOUNTER — Telehealth: Payer: Self-pay | Admitting: Orthopedic Surgery

## 2023-03-15 NOTE — Telephone Encounter (Signed)
Patient is scheduled for right shoulder removal implants (3 hours) at [facility] 04-28-23.  Josh with [employer] has been notified of upcoming surgery date.  Josh states patient will need another scan after the removal.  Most recent scan was done 02-07-23 at [facility]. Please advise if referral for another scan is needed.

## 2023-03-15 NOTE — Telephone Encounter (Signed)
We will do ct 2 w after surgery thx

## 2023-03-17 NOTE — Telephone Encounter (Signed)
Holding as a reminder

## 2023-03-24 DIAGNOSIS — M62830 Muscle spasm of back: Secondary | ICD-10-CM | POA: Diagnosis not present

## 2023-03-24 DIAGNOSIS — M9902 Segmental and somatic dysfunction of thoracic region: Secondary | ICD-10-CM | POA: Diagnosis not present

## 2023-03-24 DIAGNOSIS — M51362 Other intervertebral disc degeneration, lumbar region with discogenic back pain and lower extremity pain: Secondary | ICD-10-CM | POA: Diagnosis not present

## 2023-03-24 DIAGNOSIS — M9901 Segmental and somatic dysfunction of cervical region: Secondary | ICD-10-CM | POA: Diagnosis not present

## 2023-03-24 DIAGNOSIS — M5413 Radiculopathy, cervicothoracic region: Secondary | ICD-10-CM | POA: Diagnosis not present

## 2023-03-24 DIAGNOSIS — M546 Pain in thoracic spine: Secondary | ICD-10-CM | POA: Diagnosis not present

## 2023-03-24 DIAGNOSIS — M9903 Segmental and somatic dysfunction of lumbar region: Secondary | ICD-10-CM | POA: Diagnosis not present

## 2023-03-24 DIAGNOSIS — M50323 Other cervical disc degeneration at C6-C7 level: Secondary | ICD-10-CM | POA: Diagnosis not present

## 2023-03-25 ENCOUNTER — Ambulatory Visit (INDEPENDENT_AMBULATORY_CARE_PROVIDER_SITE_OTHER): Payer: Medicare Other | Admitting: Orthopedic Surgery

## 2023-03-25 DIAGNOSIS — M19019 Primary osteoarthritis, unspecified shoulder: Secondary | ICD-10-CM | POA: Diagnosis not present

## 2023-03-26 ENCOUNTER — Encounter: Payer: Self-pay | Admitting: Orthopedic Surgery

## 2023-03-26 NOTE — Progress Notes (Signed)
Office Visit Note   Patient: Joseph [name]           Date of Birth: Aug 09, 1955           MRN: [id number] Visit Date: 03/25/2023 Requested by: Sharlene Dory, DO [address] [place],  [place] [address] PCP: Sharlene Dory, DO  Subjective: Chief Complaint  Patient presents with   Right Shoulder - Follow-up    HPI: Joseph [name] is a 67 y.o. male who presents to the office reporting right shoulder pain.  He would like to discuss the surgery scheduled for 04/28/2023.  Tramadol has not been helpful.  Patient does have vertigo which was slightly worsened with the tramadol.  Patient states he really cannot take pain medication.  He has not yet tried Dilaudid however.  With his prior shoulder surgery he states that he used "IV anti-inflammatory medicine".  Tried Percocet for a week but that also exacerbated his vertigo.  He does have loose total joint replacement with significant cystic formation on the glenoid side.  Also has a well-fixed humeral component..                ROS: All systems reviewed are negative as they relate to the chief complaint within the history of present illness.  Patient denies fevers or chills.  Assessment & Plan: Visit Diagnoses:  1. Shoulder arthritis     Plan: Impression is right shoulder failed implant with no evidence of infection.  Loosening and particle wear is present.  Patient has a large but apparently contained defect on the glenoid.  His best option for surgical intervention would be removal of implants followed by CT scan and vault reconstruction system for reverse shoulder replacement.  I do not think conventional implants would achieve the necessary fixation required for this to be done in a 1 stage procedure.  The risk and benefits of the procedure are discussed with the patient including not limited to infection or vessel damage incomplete pain relief as well as instability.  Patient understands risk and benefits and  wishes to proceed.  We did use models to discuss the surgical plan.  All questions answered.  Follow-Up Instructions: No follow-ups on file.   Orders:  No orders of the defined types were placed in this encounter.  No orders of the defined types were placed in this encounter.     Procedures: No procedures performed   Clinical Data: No additional findings.  Objective: Vital Signs: There were no vitals taken for this visit.  Physical Exam:  Constitutional: Patient appears well-developed HEENT:  Head: Normocephalic Eyes:EOM are normal Neck: Normal range of motion Cardiovascular: Normal rate Pulmonary/chest: Effort normal Neurologic: Patient is alert Skin: Skin is warm Psychiatric: Patient has normal mood and affect  Ortho Exam: Ortho exam demonstrates well-healed surgical incision on that right shoulder.  Motor or sensory function of the hand is intact.  Deltoid fires.  Does have a little bit of crepitus with range of motion with internal and external rotation at 90 degrees of forward flexion.  As well as 90 degrees of abduction.  Subscap strength is 5- out of 5.  External rotation strength is pretty reasonable at 5 out of 5.  Specialty Comments:  No specialty comments available.  Imaging: No results found.   PMFS History: Patient Active Problem List   Diagnosis Date Noted   Umbilical hernia without obstruction and without gangrene 07/05/2022   Insulin resistance 09/03/2019   Cubital tunnel syndrome 07/16/2019  Localized, primary osteoarthritis of shoulder region 07/16/2019   Shoulder pain 07/16/2019   Low testosterone 04/13/2018   Mixed hyperlipidemia    Bilateral sensorineural hearing loss 11/01/2017   Subjective tinnitus of right ear 11/01/2017   Hyperlipidemia 09/14/2017   History of tobacco abuse 09/14/2017   Overweight (BMI 25.0-29.9) 09/14/2017   Vertigo 07/14/2016   Arthritis of hand 02/09/2016   Right hand pain 02/09/2016   Cervical spondylosis with  myelopathy 01/15/2016   Lumbar facet arthropathy 12/12/2015   Lumbar stenosis 12/12/2015   Meralgia paresthetica 12/12/2015   Neurofibroma 12/12/2015   Other intervertebral disc degeneration, lumbar region 12/12/2015   Dysphagia 10/31/2015   GERD (gastroesophageal reflux disease) 10/31/2015   Neuropathy 10/21/2015   Essential hypertension 02/11/2015   Shingles 07/10/2014   Low back pain 05/01/2014   Kidney stone 05/01/2014   Past Medical History:  Diagnosis Date   Arthritis    GERD (gastroesophageal reflux disease)    Heart murmur    Mixed hyperlipidemia    Prediabetes    Rheumatic fever     Family History  Problem Relation Age of Onset   Arthritis Mother        Rheumatoid   Diabetes Mother    Arthritis Father    Hypertension Father    Diabetes Brother    Lupus Brother     Past Surgical History:  Procedure Laterality Date   BACK SURGERY  2012   JOINT REPLACEMENT Right 2013   SPINE SURGERY  1995   C4-5, C5-C6    TONSILLECTOMY  1963   Social History   Occupational History   Occupation: Disability    Comment: Shoulder / Back  Tobacco Use   Smoking status: Former    Current packs/day: 0.00    Average packs/day: 1.5 packs/day for 20.0 years (30.0 ttl pk-yrs)    Types: Cigarettes    Start date: 05/01/1970    Quit date: 05/01/1990    Years since quitting: 32.9   Smokeless tobacco: Never  Substance and Sexual Activity   Alcohol use: No    Alcohol/week: 0.0 standard drinks of alcohol   Drug use: No   Sexual activity: Yes    Partners: Female

## 2023-03-30 DIAGNOSIS — M9903 Segmental and somatic dysfunction of lumbar region: Secondary | ICD-10-CM | POA: Diagnosis not present

## 2023-03-30 DIAGNOSIS — M62830 Muscle spasm of back: Secondary | ICD-10-CM | POA: Diagnosis not present

## 2023-03-30 DIAGNOSIS — M5413 Radiculopathy, cervicothoracic region: Secondary | ICD-10-CM | POA: Diagnosis not present

## 2023-03-30 DIAGNOSIS — M50323 Other cervical disc degeneration at C6-C7 level: Secondary | ICD-10-CM | POA: Diagnosis not present

## 2023-03-30 DIAGNOSIS — M51362 Other intervertebral disc degeneration, lumbar region with discogenic back pain and lower extremity pain: Secondary | ICD-10-CM | POA: Diagnosis not present

## 2023-03-30 DIAGNOSIS — M546 Pain in thoracic spine: Secondary | ICD-10-CM | POA: Diagnosis not present

## 2023-03-30 DIAGNOSIS — M9902 Segmental and somatic dysfunction of thoracic region: Secondary | ICD-10-CM | POA: Diagnosis not present

## 2023-03-30 DIAGNOSIS — M9901 Segmental and somatic dysfunction of cervical region: Secondary | ICD-10-CM | POA: Diagnosis not present

## 2023-04-06 DIAGNOSIS — M62830 Muscle spasm of back: Secondary | ICD-10-CM | POA: Diagnosis not present

## 2023-04-06 DIAGNOSIS — M5413 Radiculopathy, cervicothoracic region: Secondary | ICD-10-CM | POA: Diagnosis not present

## 2023-04-06 DIAGNOSIS — M546 Pain in thoracic spine: Secondary | ICD-10-CM | POA: Diagnosis not present

## 2023-04-06 DIAGNOSIS — M9902 Segmental and somatic dysfunction of thoracic region: Secondary | ICD-10-CM | POA: Diagnosis not present

## 2023-04-06 DIAGNOSIS — M9901 Segmental and somatic dysfunction of cervical region: Secondary | ICD-10-CM | POA: Diagnosis not present

## 2023-04-06 DIAGNOSIS — M51362 Other intervertebral disc degeneration, lumbar region with discogenic back pain and lower extremity pain: Secondary | ICD-10-CM | POA: Diagnosis not present

## 2023-04-06 DIAGNOSIS — M50323 Other cervical disc degeneration at C6-C7 level: Secondary | ICD-10-CM | POA: Diagnosis not present

## 2023-04-06 DIAGNOSIS — M9903 Segmental and somatic dysfunction of lumbar region: Secondary | ICD-10-CM | POA: Diagnosis not present

## 2023-04-11 DIAGNOSIS — D334 Benign neoplasm of spinal cord: Secondary | ICD-10-CM | POA: Diagnosis not present

## 2023-04-11 DIAGNOSIS — M48062 Spinal stenosis, lumbar region with neurogenic claudication: Secondary | ICD-10-CM | POA: Diagnosis not present

## 2023-04-20 ENCOUNTER — Encounter (HOSPITAL_COMMUNITY): Payer: Self-pay

## 2023-04-20 NOTE — Progress Notes (Signed)
Surgical Instructions   Your procedure is scheduled on Thursday April 28, 2023. Report to [facility] Main Entrance "A" at 5:30 A.M., then check in with the Admitting office. Any questions or running late day of surgery: call [phone]  Questions prior to your surgery date: call [phone], Monday-Friday, 8am-4pm. If you experience any cold or flu symptoms such as cough, fever, chills, shortness of breath, etc. between now and your scheduled surgery, please notify us at the above number.     Remember:  Do not eat after midnight the night before your surgery  You may drink clear liquids until 4:30 the morning of your surgery.   Clear liquids allowed are: Water, Non-Citrus Juices (without pulp), Carbonated Beverages, Clear Tea (no milk, honey, etc.), Black Coffee Only (NO MILK, CREAM OR POWDERED CREAMER of any kind), and Gatorade.  Patient Instructions  The night before surgery:  No food after midnight. ONLY clear liquids after midnight  The day of surgery (if you do NOT have diabetes):  Drink ONE (1) Pre-Surgery Clear Ensure by 4:30 the morning of surgery. Drink in one sitting. Do not sip.  This drink was given to you during your hospital  pre-op appointment visit.  Nothing else to drink after completing the  Pre-Surgery Clear Ensure.         If you have questions, please contact your surgeon's office.     Take these medicines the morning of surgery with A SIP OF WATER  amLODipine (NORVASC)  pantoprazole (PROTONIX)   May take these medicines IF NEEDED: traMADol (ULTRAM)    One week prior to surgery, STOP taking any Aspirin (unless otherwise instructed by your surgeon) Aleve, Naproxen, Ibuprofen, Motrin, Advil, Goody's, BC's, all herbal medications, fish oil, and non-prescription vitamins.  This includes yourDiclofenac Sodium 1.5 % SOLN and your meloxicam (MOBIC).                        Do NOT Smoke (Tobacco/Vaping) for 24 hours prior to your procedure.  If you use  a CPAP at night, you may bring your mask/headgear for your overnight stay.   You will be asked to remove any contacts, glasses, piercing's, hearing aid's, dentures/partials prior to surgery. Please bring cases for these items if needed.    Patients discharged the day of surgery will not be allowed to drive home, and someone needs to stay with them for 24 hours.  SURGICAL WAITING ROOM VISITATION Patients may have no more than 2 support people in the waiting area - these visitors may rotate.   Pre-op nurse will coordinate an appropriate time for 1 ADULT support person, who may not rotate, to accompany patient in pre-op.  Children under the age of 47 must have an adult with them who is not the patient and must remain in the main waiting area with an adult.  If the patient needs to stay at the hospital during part of their recovery, the visitor guidelines for inpatient rooms apply.  Please refer to the [facility] website for the visitor guidelines for any additional information.   If you received a COVID test during your pre-op visit  it is requested that you wear a mask when out in public, stay away from anyone that may not be feeling well and notify your surgeon if you develop symptoms. If you have been in contact with anyone that has tested positive in the last 10 days please notify you surgeon.      Pre-operative 5  CHG Bathing Instructions   You can play a key role in reducing the risk of infection after surgery. Your skin needs to be as free of germs as possible. You can reduce the number of germs on your skin by washing with CHG (chlorhexidine gluconate) soap before surgery. CHG is an antiseptic soap that kills germs and continues to kill germs even after washing.   DO NOT use if you have an allergy to chlorhexidine/CHG or antibacterial soaps. If your skin becomes reddened or irritated, stop using the CHG and notify one of our RNs at [phone].   Please shower with the CHG soap  starting 4 days before surgery using the following schedule:     Please keep in mind the following:  DO NOT shave, including legs and underarms, starting the day of your first shower.   You may shave your face at any point before/day of surgery.  Place clean sheets on your bed the day you start using CHG soap. Use a clean washcloth (not used since being washed) for each shower. DO NOT sleep with pets once you start using the CHG.   CHG Shower Instructions:  Wash your face and private area with normal soap. If you choose to wash your hair, wash first with your normal shampoo.  After you use shampoo/soap, rinse your hair and body thoroughly to remove shampoo/soap residue.  Turn the water OFF and apply about 3 tablespoons (45 ml) of CHG soap to a CLEAN washcloth.  Apply CHG soap ONLY FROM YOUR NECK DOWN TO YOUR TOES (washing for 3-5 minutes)  DO NOT use CHG soap on face, private areas, open wounds, or sores.  Pay special attention to the area where your surgery is being performed.  If you are having back surgery, having someone wash your back for you may be helpful. Wait 2 minutes after CHG soap is applied, then you may rinse off the CHG soap.  Pat dry with a clean towel  Put on clean clothes/pajamas   If you choose to wear lotion, please use ONLY the CHG-compatible lotions on the back of this paper.   Additional instructions for the day of surgery: DO NOT APPLY any lotions, deodorants or cologne.    Do not bring valuables to the hospital. [facility] is not responsible for any belongings/valuables. Do not wear jewelry Put on clean/comfortable clothes.  Please brush your teeth.  Ask your nurse before applying any prescription medications to the skin.     CHG Compatible Lotions   Aveeno Moisturizing lotion  Cetaphil Moisturizing Cream  Cetaphil Moisturizing Lotion  Clairol Herbal Essence Moisturizing Lotion, Dry Skin  Clairol Herbal Essence Moisturizing Lotion, Extra Dry Skin   Clairol Herbal Essence Moisturizing Lotion, Normal Skin  Curel Age Defying Therapeutic Moisturizing Lotion with Alpha Hydroxy  Curel Extreme Care Body Lotion  Curel Soothing Hands Moisturizing Hand Lotion  Curel Therapeutic Moisturizing Cream, Fragrance-Free  Curel Therapeutic Moisturizing Lotion, Fragrance-Free  Curel Therapeutic Moisturizing Lotion, Original Formula  Eucerin Daily Replenishing Lotion  Eucerin Dry Skin Therapy Plus Alpha Hydroxy Crme  Eucerin Dry Skin Therapy Plus Alpha Hydroxy Lotion  Eucerin Original Crme  Eucerin Original Lotion  Eucerin Plus Crme Eucerin Plus Lotion  Eucerin TriLipid Replenishing Lotion  Keri Anti-Bacterial Hand Lotion  Keri Deep Conditioning Original Lotion Dry Skin Formula Softly Scented  Keri Deep Conditioning Original Lotion, Fragrance Free Sensitive Skin Formula  Keri Lotion Fast Absorbing Fragrance Free Sensitive Skin Formula  Keri Lotion Fast Absorbing Softly Scented Dry Skin Formula  Keri Original Lotion  [employer] Skin Renewal Lotion Keri Silky Smooth Lotion  Keri Silky Smooth Sensitive Skin Lotion  Nivea Body Creamy Conditioning Patent examiner Moisturizing Lotion Nivea Crme  Nivea Skin Firming Lotion  NutraDerm 30 Skin Lotion  NutraDerm Skin Lotion  NutraDerm Therapeutic Skin Cream  NutraDerm Therapeutic Skin Lotion  ProShield Protective Hand Cream  Provon moisturizing lotion  Please read over the following fact sheets that you were given.

## 2023-04-21 ENCOUNTER — Other Ambulatory Visit: Payer: Self-pay

## 2023-04-21 ENCOUNTER — Encounter (HOSPITAL_COMMUNITY): Payer: Self-pay

## 2023-04-21 ENCOUNTER — Encounter (HOSPITAL_COMMUNITY)
Admission: RE | Admit: 2023-04-21 | Discharge: 2023-04-21 | Disposition: A | Discharge status: Home / Self Care | Payer: Medicare Other | Source: Ambulatory Visit | Attending: Orthopedic Surgery | Admitting: Orthopedic Surgery

## 2023-04-21 DIAGNOSIS — I129 Hypertensive chronic kidney disease with stage 1 through stage 4 chronic kidney disease, or unspecified chronic kidney disease: Secondary | ICD-10-CM | POA: Diagnosis not present

## 2023-04-21 DIAGNOSIS — R7303 Prediabetes: Secondary | ICD-10-CM | POA: Insufficient documentation

## 2023-04-21 DIAGNOSIS — E785 Hyperlipidemia, unspecified: Secondary | ICD-10-CM | POA: Diagnosis not present

## 2023-04-21 DIAGNOSIS — R011 Cardiac murmur, unspecified: Secondary | ICD-10-CM | POA: Insufficient documentation

## 2023-04-21 DIAGNOSIS — Z87891 Personal history of nicotine dependence: Secondary | ICD-10-CM | POA: Insufficient documentation

## 2023-04-21 DIAGNOSIS — N1831 Chronic kidney disease, stage 3a: Secondary | ICD-10-CM | POA: Insufficient documentation

## 2023-04-21 DIAGNOSIS — Z01818 Encounter for other preprocedural examination: Secondary | ICD-10-CM | POA: Diagnosis not present

## 2023-04-21 HISTORY — DX: Other complications of anesthesia, initial encounter: T88.59XA

## 2023-04-21 HISTORY — DX: Pneumonia, unspecified organism: J18.9

## 2023-04-21 HISTORY — DX: Essential (primary) hypertension: I10

## 2023-04-21 HISTORY — DX: Unspecified osteoarthritis, unspecified site: M19.90

## 2023-04-21 HISTORY — DX: Other specified postprocedural states: Z98.890

## 2023-04-21 HISTORY — DX: Dizziness and giddiness: R42

## 2023-04-21 HISTORY — DX: Nausea with vomiting, unspecified: R11.2

## 2023-04-21 LAB — BASIC METABOLIC PANEL
Anion gap: 7 (ref 5–15)
BUN: 17 mg/dL (ref 8–23)
CO2: 27 mmol/L (ref 22–32)
Calcium: 9.3 mg/dL (ref 8.9–10.3)
Chloride: 106 mmol/L (ref 98–111)
Creatinine, Ser: 1.38 mg/dL — ABNORMAL HIGH (ref 0.61–1.24)
GFR, Estimated: 56 mL/min — ABNORMAL LOW (ref >60)
Glucose, Bld: 92 mg/dL (ref 70–99)
Potassium: 4.1 mmol/L (ref 3.5–5.1)
Sodium: 140 mmol/L (ref 135–145)

## 2023-04-21 LAB — CBC
HCT: 46.7 % (ref 39.0–52.0)
Hemoglobin: 15.2 g/dL (ref 13.0–17.0)
MCH: 29.3 pg (ref 26.0–34.0)
MCHC: 32.5 g/dL (ref 30.0–36.0)
MCV: 90 fL (ref 80.0–100.0)
Platelets: 228 10*3/uL (ref 150–400)
RBC: 5.19 MIL/uL (ref 4.22–5.81)
RDW: 13 % (ref 11.5–15.5)
WBC: 6.7 10*3/uL (ref 4.0–10.5)
nRBC: 0 % (ref 0.0–0.2)

## 2023-04-21 LAB — TYPE AND SCREEN
ABO/RH(D): A POS
Antibody Screen: NEGATIVE

## 2023-04-21 LAB — SURGICAL PCR SCREEN
MRSA, PCR: NEGATIVE
Staphylococcus aureus: NEGATIVE

## 2023-04-22 DIAGNOSIS — M50323 Other cervical disc degeneration at C6-C7 level: Secondary | ICD-10-CM | POA: Diagnosis not present

## 2023-04-22 DIAGNOSIS — M62830 Muscle spasm of back: Secondary | ICD-10-CM | POA: Diagnosis not present

## 2023-04-22 DIAGNOSIS — M51362 Other intervertebral disc degeneration, lumbar region with discogenic back pain and lower extremity pain: Secondary | ICD-10-CM | POA: Diagnosis not present

## 2023-04-22 DIAGNOSIS — M546 Pain in thoracic spine: Secondary | ICD-10-CM | POA: Diagnosis not present

## 2023-04-22 DIAGNOSIS — M5413 Radiculopathy, cervicothoracic region: Secondary | ICD-10-CM | POA: Diagnosis not present

## 2023-04-22 DIAGNOSIS — M9902 Segmental and somatic dysfunction of thoracic region: Secondary | ICD-10-CM | POA: Diagnosis not present

## 2023-04-22 DIAGNOSIS — M9903 Segmental and somatic dysfunction of lumbar region: Secondary | ICD-10-CM | POA: Diagnosis not present

## 2023-04-22 DIAGNOSIS — M9901 Segmental and somatic dysfunction of cervical region: Secondary | ICD-10-CM | POA: Diagnosis not present

## 2023-04-22 NOTE — Progress Notes (Signed)
Anesthesia Chart Review:  Case: [id number] Date/Time: 04/28/23 0715   Procedure: RIGHT SHOULDER REMOVAL IMPLANTS (Right: Shoulder)   Anesthesia type: General   Pre-op diagnosis: right total shoulder with particle wear   Location: [facility] OR ROOM 06 / [facility] OR   Surgeons: Cammy Copa, MD       DISCUSSION: Patient is a 67 year old male scheduled for the above procedure.  History includes former smoker (quit 05/01/90), post-operative N/V, childhood murmur, rheumatic fever, prediabetes, HTN, HLD, prediabetes, osteoarthritis (right TSA 05/24/12), spinal surgery (C4-6 fusion; right L4-5 hemilaminectomy, discectomy 2012 ). Lab trends suggest CKD stage 3a with Creatinine ~ 1.38 - 1.41 and eGFR 50-56 since 07/2019.   Noted history of childhood murmur with rheumatic fever history. This APP was not asked to evaluate in person. I see several primary care exams within the past 10 years that document no murmur. Last PCP visit with Dr. Carmelia Roller was on 08/04/22. HTN was well controlled then on amlodipine 5 mg daily. No chest pain or SOB. He did not document a murmur.  He is on statin therapy.  Creatinine 1.38 which is consistent with prior results.  Glucose 92.  CBC normal.  Anesthesia team to evaluate on the day of surgery.  VS: BP (!) 145/95   Pulse 70   Temp 36.7 C   Resp 17   Ht 6\' 2"  (1.88 m)   Wt 97.6 kg   SpO2 99%   BMI 27.63 kg/m   PROVIDERS: Sharlene Dory, DO is PCP    LABS: Labs reviewed: Acceptable for surgery.  A1c 5.5% 5-23. (all labs ordered are listed, but only abnormal results are displayed)  Labs Reviewed  BASIC METABOLIC PANEL - Abnormal; Notable for the following components:      Result Value   Creatinine, Ser 1.38 (*)    GFR, Estimated 56 (*)    All other components within normal limits  SURGICAL PCR SCREEN  CBC  TYPE AND SCREEN     IMAGES: MRI L-spine 04/11/23 ([facility]): 1.  No significant interval change in size or appearance of the presumed nerve  sheath tumor (such as schwannoma) along the right S2 nerve root relative to most recent MRI performed 04/15/2022. As noted previously, there has been slight interval enlargement from more remote priors.  2.  Multilevel degenerative spondylosis of the lumbar spine, with slight interval progression at the level of L2-L3 where there is severe canal stenosis. Additional multilevel degenerative changes as further detailed above.  3.  New hyperintense STIR signal and associated enhancement along the right eccentric superior endplate of the L3 vertebral body with associated focal defect, possibly reflecting active Schmorl's node.   CT Right Shoulder 02/07/23: IMPRESSION: 1. Right shoulder arthroplasty. Cystic area in the inferior glenoid measuring 2.1 x 1.2 cm concerning for particle disease.   MRI C-spine 07/11/21: IMPRESSION: 1. Cervical spondylosis, congenitally short pedicles, and degenerative disc disease, causing prominent impingement at C2-3 and C3-4, and mild impingement at C6-7, as detailed above. 2. Solid interbody fusion at C4-C5-C6.     EKG: 04/21/23: Normal sinus rhythm Cannot rule out Anterior infarct , age undetermined Abnormal ECG No previous ECGs available - No available EKG tracings for compare. Per Care Everywhere Narrative, EKG on 11/26/15 showed sinus bradycardia cardia, possible left atrial enlargement, nonspecific ST and T wave abnormality.   CV: N/A  Past Medical History:  Diagnosis Date   Arthritis    Complication of anesthesia    GERD (gastroesophageal reflux disease)    Heart  murmur    as a child   Hypertension    Mixed hyperlipidemia    Osteoarthritis    Pneumonia    PONV (postoperative nausea and vomiting)    Prediabetes    Rheumatic fever    Vertigo     Past Surgical History:  Procedure Laterality Date   BACK SURGERY  2012   JOINT REPLACEMENT Right 2013   SPINE SURGERY  1995   C4-5, C5-C6    TONSILLECTOMY  1963    MEDICATIONS:  amLODipine  (NORVASC) 5 MG tablet   atorvastatin (LIPITOR) 40 MG tablet   Diclofenac Sodium 1.5 % SOLN   lidocaine (LIDODERM) 5 %   meloxicam (MOBIC) 7.5 MG tablet   pantoprazole (PROTONIX) 40 MG tablet   tiZANidine (ZANAFLEX) 4 MG tablet   traMADol (ULTRAM) 50 MG tablet   No current facility-administered medications for this encounter.   Shonna Chock, PA-C Surgical Short Stay/Anesthesiology [facility] Phone [phone] [facility] Phone [phone] 04/22/2023 6:54 PM

## 2023-04-22 NOTE — Anesthesia Preprocedure Evaluation (Signed)
Anesthesia Evaluation  Patient identified by MRN, date of birth, ID band Patient awake    Reviewed: Allergy & Precautions, NPO status , Patient's Chart, lab work & pertinent test results, reviewed documented beta blocker date and time   History of Anesthesia Complications (+) PONV and history of anesthetic complications  Airway Mallampati: III  TM Distance: >3 FB     Dental  (+) Edentulous Upper   Pulmonary neg sleep apnea, pneumonia, neg COPD, former smoker, neg PE   breath sounds clear to auscultation       Cardiovascular hypertension, (-) angina (-) CAD, (-) Past MI and (-) Cardiac Stents + Valvular Problems/Murmurs  Rhythm:Regular Rate:Normal     Neuro/Psych neg Seizures  Neuromuscular disease    GI/Hepatic ,GERD  ,,  Endo/Other    Renal/GU Renal disease     Musculoskeletal  (+) Arthritis ,    Abdominal   Peds  Hematology   Anesthesia Other Findings   Reproductive/Obstetrics                             Anesthesia Physical Anesthesia Plan  ASA: 2  Anesthesia Plan: General   Post-op Pain Management: Regional block*   Induction: Intravenous  PONV Risk Score and Plan: 4 or greater and TIVA, Dexamethasone and Ondansetron  Airway Management Planned: Oral ETT  Additional Equipment:   Intra-op Plan:   Post-operative Plan: Extubation in OR  Informed Consent: I have reviewed the patients History and Physical, chart, labs and discussed the procedure including the risks, benefits and alternatives for the proposed anesthesia with the patient or authorized representative who has indicated his/her understanding and acceptance.     Dental advisory given  Plan Discussed with:   Anesthesia Plan Comments: (PAT note written 04/22/2023 by Shonna Chock, PA-C.  )       Anesthesia Quick Evaluation

## 2023-04-26 NOTE — Telephone Encounter (Signed)
Thin cut CT affected shoulder pre op rsa thx

## 2023-04-26 NOTE — Telephone Encounter (Signed)
I confirmed with Dr August Saucer. Nothing needed at this time, until after surgery. He will confirm with me after surgery what is needed.

## 2023-04-28 ENCOUNTER — Inpatient Hospital Stay (HOSPITAL_COMMUNITY)
Admission: RE | Admit: 2023-04-28 | Discharge: 2023-04-30 | DRG: 497 | Disposition: A | Discharge status: Home Health | Payer: Medicare Other | Attending: Orthopedic Surgery | Admitting: Orthopedic Surgery

## 2023-04-28 ENCOUNTER — Other Ambulatory Visit: Payer: Self-pay

## 2023-04-28 ENCOUNTER — Inpatient Hospital Stay (HOSPITAL_COMMUNITY): Payer: Medicare Other

## 2023-04-28 ENCOUNTER — Inpatient Hospital Stay (HOSPITAL_COMMUNITY): Payer: Medicare Other | Admitting: Certified Registered Nurse Anesthetist

## 2023-04-28 ENCOUNTER — Encounter (HOSPITAL_COMMUNITY): Payer: Self-pay | Admitting: Orthopedic Surgery

## 2023-04-28 ENCOUNTER — Inpatient Hospital Stay (HOSPITAL_COMMUNITY): Payer: Self-pay | Admitting: Vascular Surgery

## 2023-04-28 ENCOUNTER — Encounter (HOSPITAL_COMMUNITY)
Admission: RE | Disposition: A | Discharge status: Home Health | Payer: Self-pay | Source: Home / Self Care | Attending: Orthopedic Surgery

## 2023-04-28 DIAGNOSIS — Z833 Family history of diabetes mellitus: Secondary | ICD-10-CM | POA: Diagnosis not present

## 2023-04-28 DIAGNOSIS — T8459XA Infection and inflammatory reaction due to other internal joint prosthesis, initial encounter: Principal | ICD-10-CM | POA: Diagnosis present

## 2023-04-28 DIAGNOSIS — Z79899 Other long term (current) drug therapy: Secondary | ICD-10-CM

## 2023-04-28 DIAGNOSIS — Z01818 Encounter for other preprocedural examination: Principal | ICD-10-CM

## 2023-04-28 DIAGNOSIS — M25411 Effusion, right shoulder: Secondary | ICD-10-CM | POA: Diagnosis not present

## 2023-04-28 DIAGNOSIS — Z885 Allergy status to narcotic agent status: Secondary | ICD-10-CM | POA: Diagnosis not present

## 2023-04-28 DIAGNOSIS — Z888 Allergy status to other drugs, medicaments and biological substances status: Secondary | ICD-10-CM | POA: Diagnosis not present

## 2023-04-28 DIAGNOSIS — K219 Gastro-esophageal reflux disease without esophagitis: Secondary | ICD-10-CM | POA: Diagnosis present

## 2023-04-28 DIAGNOSIS — R7303 Prediabetes: Secondary | ICD-10-CM | POA: Diagnosis present

## 2023-04-28 DIAGNOSIS — M19011 Primary osteoarthritis, right shoulder: Secondary | ICD-10-CM | POA: Diagnosis present

## 2023-04-28 DIAGNOSIS — T8484XA Pain due to internal orthopedic prosthetic devices, implants and grafts, initial encounter: Secondary | ICD-10-CM | POA: Diagnosis not present

## 2023-04-28 DIAGNOSIS — Z8249 Family history of ischemic heart disease and other diseases of the circulatory system: Secondary | ICD-10-CM

## 2023-04-28 DIAGNOSIS — T84068A Wear of articular bearing surface of other internal prosthetic joint, initial encounter: Secondary | ICD-10-CM | POA: Diagnosis not present

## 2023-04-28 DIAGNOSIS — Y831 Surgical operation with implant of artificial internal device as the cause of abnormal reaction of the patient, or of later complication, without mention of misadventure at the time of the procedure: Secondary | ICD-10-CM | POA: Diagnosis present

## 2023-04-28 DIAGNOSIS — M19019 Primary osteoarthritis, unspecified shoulder: Secondary | ICD-10-CM | POA: Diagnosis not present

## 2023-04-28 DIAGNOSIS — Z9889 Other specified postprocedural states: Secondary | ICD-10-CM

## 2023-04-28 DIAGNOSIS — Z87891 Personal history of nicotine dependence: Secondary | ICD-10-CM | POA: Diagnosis not present

## 2023-04-28 DIAGNOSIS — M25511 Pain in right shoulder: Secondary | ICD-10-CM | POA: Diagnosis not present

## 2023-04-28 DIAGNOSIS — Z8701 Personal history of pneumonia (recurrent): Secondary | ICD-10-CM | POA: Diagnosis not present

## 2023-04-28 DIAGNOSIS — Z8269 Family history of other diseases of the musculoskeletal system and connective tissue: Secondary | ICD-10-CM

## 2023-04-28 DIAGNOSIS — Z8261 Family history of arthritis: Secondary | ICD-10-CM | POA: Diagnosis not present

## 2023-04-28 DIAGNOSIS — Z96611 Presence of right artificial shoulder joint: Secondary | ICD-10-CM

## 2023-04-28 DIAGNOSIS — E782 Mixed hyperlipidemia: Secondary | ICD-10-CM | POA: Diagnosis present

## 2023-04-28 DIAGNOSIS — T8450XA Infection and inflammatory reaction due to unspecified internal joint prosthesis, initial encounter: Secondary | ICD-10-CM

## 2023-04-28 DIAGNOSIS — G8918 Other acute postprocedural pain: Secondary | ICD-10-CM | POA: Diagnosis not present

## 2023-04-28 DIAGNOSIS — I10 Essential (primary) hypertension: Secondary | ICD-10-CM | POA: Diagnosis present

## 2023-04-28 HISTORY — PX: SYNOVECTOMY WITH REMOVAL OF TOTAL SHOULDER: SHX6720

## 2023-04-28 LAB — URINALYSIS, W/ REFLEX TO CULTURE (INFECTION SUSPECTED)
Bacteria, UA: NONE SEEN
Bilirubin Urine: NEGATIVE
Glucose, UA: NEGATIVE mg/dL
Hgb urine dipstick: NEGATIVE
Ketones, ur: NEGATIVE mg/dL
Leukocytes,Ua: NEGATIVE
Nitrite: NEGATIVE
Protein, ur: NEGATIVE mg/dL
Specific Gravity, Urine: 1.003 — ABNORMAL LOW (ref 1.005–1.030)
pH: 6 (ref 5.0–8.0)

## 2023-04-28 LAB — ABO/RH: ABO/RH(D): A POS

## 2023-04-28 SURGERY — SYNOVECTOMY WITH REMOVAL OF TOTAL SHOULDER
Anesthesia: General | Site: Shoulder | Laterality: Right

## 2023-04-28 MED ORDER — SCOPOLAMINE 1 MG/3DAYS TD PT72
MEDICATED_PATCH | TRANSDERMAL | Status: DC | PRN
  Administered 2023-04-28: 1 via TRANSDERMAL

## 2023-04-28 MED ORDER — CHLORHEXIDINE GLUCONATE 0.12 % MT SOLN: 15.0000 mL | Freq: Once | OROMUCOSAL | Status: AC

## 2023-04-28 MED ORDER — PHENOL 1.4 % MT LIQD: 1.0000 | OROMUCOSAL | Status: DC | PRN

## 2023-04-28 MED ORDER — GABAPENTIN 100 MG PO CAPS
200.0000 mg | ORAL_CAPSULE | Freq: Three times a day (TID) | ORAL | Status: DC
  Filled 2023-04-28 (×2): qty 2

## 2023-04-28 MED ORDER — VANCOMYCIN HCL 1000 MG IV SOLR
INTRAVENOUS | Status: AC
  Filled 2023-04-28: qty 40

## 2023-04-28 MED ORDER — ROCURONIUM BROMIDE 10 MG/ML (PF) SYRINGE
PREFILLED_SYRINGE | INTRAVENOUS | Status: DC | PRN
  Administered 2023-04-28: 80 mg via INTRAVENOUS
  Administered 2023-04-28: 20 mg via INTRAVENOUS

## 2023-04-28 MED ORDER — OXYCODONE HCL 5 MG/5ML PO SOLN
5.0000 mg | Freq: Once | ORAL | Status: DC | PRN
Start: 2023-04-28 — End: 2023-04-28

## 2023-04-28 MED ORDER — ROPIVACAINE HCL 5 MG/ML IJ SOLN
INTRAMUSCULAR | Status: DC | PRN
  Administered 2023-04-28: 20 mL via PERINEURAL

## 2023-04-28 MED ORDER — DEXAMETHASONE SODIUM PHOSPHATE 10 MG/ML IJ SOLN
INTRAMUSCULAR | Status: DC | PRN
  Administered 2023-04-28: 10 mg via INTRAVENOUS

## 2023-04-28 MED ORDER — PHENYLEPHRINE HCL-NACL 20-0.9 MG/250ML-% IV SOLN
INTRAVENOUS | Status: DC | PRN
  Administered 2023-04-28: 20 ug/min via INTRAVENOUS
  Administered 2023-04-28: 50 ug/min via INTRAVENOUS

## 2023-04-28 MED ORDER — MIDAZOLAM HCL 2 MG/2ML IJ SOLN
INTRAMUSCULAR | Status: AC
Start: 2023-04-28 — End: ?
  Filled 2023-04-28: qty 2

## 2023-04-28 MED ORDER — CELECOXIB 100 MG PO CAPS
100.0000 mg | ORAL_CAPSULE | Freq: Two times a day (BID) | ORAL | Status: DC
  Administered 2023-04-28 – 2023-04-30 (×4): 100 mg via ORAL
  Filled 2023-04-28 (×4): qty 1

## 2023-04-28 MED ORDER — LIDOCAINE 2% (20 MG/ML) 5 ML SYRINGE
INTRAMUSCULAR | Status: DC | PRN
  Administered 2023-04-28: 100 mg via INTRAVENOUS

## 2023-04-28 MED ORDER — METHOCARBAMOL 500 MG PO TABS
500.0000 mg | ORAL_TABLET | Freq: Four times a day (QID) | ORAL | Status: DC | PRN
  Administered 2023-04-30: 500 mg via ORAL
  Filled 2023-04-28: qty 1

## 2023-04-28 MED ORDER — DEXAMETHASONE SODIUM PHOSPHATE 10 MG/ML IJ SOLN
INTRAMUSCULAR | Status: DC | PRN
  Administered 2023-04-28: 10 mg

## 2023-04-28 MED ORDER — CEFAZOLIN SODIUM-DEXTROSE 2-4 GM/100ML-% IV SOLN: 2.0000 g | Freq: Three times a day (TID) | INTRAVENOUS | Status: DC

## 2023-04-28 MED ORDER — PANTOPRAZOLE SODIUM 40 MG PO TBEC
40.0000 mg | DELAYED_RELEASE_TABLET | Freq: Two times a day (BID) | ORAL | Status: DC
  Administered 2023-04-28 – 2023-04-30 (×3): 40 mg via ORAL
  Filled 2023-04-28 (×4): qty 1

## 2023-04-28 MED ORDER — LACTATED RINGERS IV SOLN: INTRAVENOUS | Status: DC

## 2023-04-28 MED ORDER — FENTANYL CITRATE (PF) 250 MCG/5ML IJ SOLN
INTRAMUSCULAR | Status: AC
  Filled 2023-04-28: qty 5

## 2023-04-28 MED ORDER — TRANEXAMIC ACID-NACL 1000-0.7 MG/100ML-% IV SOLN
1000.0000 mg | INTRAVENOUS | Status: AC
  Administered 2023-04-28: 1000 mg via INTRAVENOUS
  Filled 2023-04-28: qty 100

## 2023-04-28 MED ORDER — ACETAMINOPHEN 500 MG PO TABS
1000.0000 mg | ORAL_TABLET | Freq: Four times a day (QID) | ORAL | Status: AC
  Administered 2023-04-28 – 2023-04-29 (×3): 1000 mg via ORAL
  Filled 2023-04-28 (×3): qty 2

## 2023-04-28 MED ORDER — MIDAZOLAM HCL 2 MG/2ML IJ SOLN
INTRAMUSCULAR | Status: DC | PRN
  Administered 2023-04-28 (×2): 1 mg via INTRAVENOUS

## 2023-04-28 MED ORDER — DOCUSATE SODIUM 100 MG PO CAPS
100.0000 mg | ORAL_CAPSULE | Freq: Two times a day (BID) | ORAL | Status: DC
Start: 2023-04-28 — End: 2023-04-30
  Administered 2023-04-28 – 2023-04-30 (×4): 100 mg via ORAL
  Filled 2023-04-28 (×4): qty 1

## 2023-04-28 MED ORDER — PROPOFOL 500 MG/50ML IV EMUL
INTRAVENOUS | Status: DC | PRN
  Administered 2023-04-28: 125 ug/kg/min via INTRAVENOUS

## 2023-04-28 MED ORDER — PROPOFOL 10 MG/ML IV BOLUS
INTRAVENOUS | Status: AC
  Filled 2023-04-28: qty 20

## 2023-04-28 MED ORDER — SODIUM CHLORIDE 0.9 % IR SOLN
Status: DC | PRN
  Administered 2023-04-28: 9000 mL
  Administered 2023-04-28: 1000 mL

## 2023-04-28 MED ORDER — ACETAMINOPHEN 325 MG PO TABS
325.0000 mg | ORAL_TABLET | Freq: Four times a day (QID) | ORAL | Status: DC | PRN
Start: 2023-04-29 — End: 2023-04-30
  Administered 2023-04-28: 650 mg via ORAL

## 2023-04-28 MED ORDER — CEFAZOLIN SODIUM-DEXTROSE 2-4 GM/100ML-% IV SOLN
2.0000 g | INTRAVENOUS | Status: AC
  Administered 2023-04-28: 2 g via INTRAVENOUS
  Filled 2023-04-28: qty 100

## 2023-04-28 MED ORDER — ASPIRIN 81 MG PO TBEC
81.0000 mg | DELAYED_RELEASE_TABLET | Freq: Every day | ORAL | Status: DC
  Filled 2023-04-28 (×2): qty 1

## 2023-04-28 MED ORDER — SURGIRINSE WOUND IRRIGATION SYSTEM - OPTIME: TOPICAL | Status: DC | PRN

## 2023-04-28 MED ORDER — ONDANSETRON HCL 4 MG PO TABS: 4.0000 mg | ORAL_TABLET | Freq: Four times a day (QID) | ORAL | Status: DC | PRN

## 2023-04-28 MED ORDER — METOCLOPRAMIDE HCL 5 MG/ML IJ SOLN: 5.0000 mg | Freq: Three times a day (TID) | INTRAMUSCULAR | Status: DC | PRN

## 2023-04-28 MED ORDER — ORAL CARE MOUTH RINSE
15.0000 mL | Freq: Once | OROMUCOSAL | Status: AC
Start: 2023-04-28 — End: 2023-04-28

## 2023-04-28 MED ORDER — OXYCODONE HCL 5 MG PO TABS: 5.0000 mg | ORAL_TABLET | Freq: Once | ORAL | Status: DC | PRN

## 2023-04-28 MED ORDER — CLONIDINE HCL (ANALGESIA) 100 MCG/ML EP SOLN
EPIDURAL | Status: DC | PRN
  Administered 2023-04-28: 100 ug

## 2023-04-28 MED ORDER — ACETAMINOPHEN 10 MG/ML IV SOLN
1000.0000 mg | Freq: Once | INTRAVENOUS | Status: DC | PRN
Start: 2023-04-28 — End: 2023-04-28

## 2023-04-28 MED ORDER — SODIUM CHLORIDE 0.9 % IV SOLN
2.0000 g | INTRAVENOUS | Status: DC
  Administered 2023-04-28 – 2023-04-30 (×3): 2 g via INTRAVENOUS
  Filled 2023-04-28 (×3): qty 20

## 2023-04-28 MED ORDER — SUGAMMADEX SODIUM 200 MG/2ML IV SOLN
INTRAVENOUS | Status: DC | PRN
  Administered 2023-04-28: 100 mg via INTRAVENOUS

## 2023-04-28 MED ORDER — ALBUMIN HUMAN 5 % IV SOLN: INTRAVENOUS | Status: DC | PRN

## 2023-04-28 MED ORDER — AMLODIPINE BESYLATE 5 MG PO TABS
5.0000 mg | ORAL_TABLET | Freq: Every day | ORAL | Status: DC
  Administered 2023-04-29 – 2023-04-30 (×2): 5 mg via ORAL
  Filled 2023-04-28 (×2): qty 1

## 2023-04-28 MED ORDER — POVIDONE-IODINE 10 % EX SWAB
2.0000 | Freq: Once | CUTANEOUS | Status: AC
  Administered 2023-04-28: 2 via TOPICAL

## 2023-04-28 MED ORDER — ACETAMINOPHEN 325 MG PO TABS
ORAL_TABLET | ORAL | Status: AC
  Filled 2023-04-28: qty 2

## 2023-04-28 MED ORDER — FENTANYL CITRATE (PF) 250 MCG/5ML IJ SOLN
INTRAMUSCULAR | Status: DC | PRN
  Administered 2023-04-28 (×2): 50 ug via INTRAVENOUS

## 2023-04-28 MED ORDER — POVIDONE-IODINE 7.5 % EX SOLN
Freq: Once | CUTANEOUS | Status: DC
  Filled 2023-04-28: qty 118

## 2023-04-28 MED ORDER — ONDANSETRON HCL 4 MG/2ML IJ SOLN
4.0000 mg | Freq: Once | INTRAMUSCULAR | Status: DC | PRN
Start: 2023-04-28 — End: 2023-04-28

## 2023-04-28 MED ORDER — EPHEDRINE SULFATE-NACL 50-0.9 MG/10ML-% IV SOSY
PREFILLED_SYRINGE | INTRAVENOUS | Status: DC | PRN
  Administered 2023-04-28 (×2): 5 mg via INTRAVENOUS

## 2023-04-28 MED ORDER — PHENYLEPHRINE 80 MCG/ML (10ML) SYRINGE FOR IV PUSH (FOR BLOOD PRESSURE SUPPORT)
PREFILLED_SYRINGE | INTRAVENOUS | Status: DC | PRN
  Administered 2023-04-28: 80 ug via INTRAVENOUS

## 2023-04-28 MED ORDER — HYDROMORPHONE HCL 1 MG/ML IJ SOLN
0.5000 mg | INTRAMUSCULAR | Status: DC | PRN
  Administered 2023-04-28 – 2023-04-30 (×11): 0.5 mg via INTRAVENOUS
  Filled 2023-04-28 (×11): qty 0.5

## 2023-04-28 MED ORDER — MENTHOL 3 MG MT LOZG: 1.0000 | LOZENGE | OROMUCOSAL | Status: DC | PRN

## 2023-04-28 MED ORDER — DAPTOMYCIN-SODIUM CHLORIDE 700-0.9 MG/100ML-% IV SOLN
700.0000 mg | Freq: Every day | INTRAVENOUS | Status: DC
  Administered 2023-04-28 – 2023-04-30 (×3): 700 mg via INTRAVENOUS
  Filled 2023-04-28 (×3): qty 100

## 2023-04-28 MED ORDER — HYDROMORPHONE HCL 2 MG PO TABS
1.0000 mg | ORAL_TABLET | ORAL | Status: DC | PRN
Start: 2023-04-28 — End: 2023-04-30
  Administered 2023-04-30: 1 mg via ORAL
  Filled 2023-04-28: qty 1

## 2023-04-28 MED ORDER — CHLORHEXIDINE GLUCONATE 0.12 % MT SOLN
OROMUCOSAL | Status: AC
  Administered 2023-04-28: 15 mL via OROMUCOSAL
  Filled 2023-04-28: qty 15

## 2023-04-28 MED ORDER — METHOCARBAMOL 1000 MG/10ML IJ SOLN: 500.0000 mg | Freq: Four times a day (QID) | INTRAMUSCULAR | Status: DC | PRN

## 2023-04-28 MED ORDER — METOCLOPRAMIDE HCL 5 MG PO TABS: 5.0000 mg | ORAL_TABLET | Freq: Three times a day (TID) | ORAL | Status: DC | PRN

## 2023-04-28 MED ORDER — PROPOFOL 10 MG/ML IV BOLUS
INTRAVENOUS | Status: DC | PRN
  Administered 2023-04-28: 150 mg via INTRAVENOUS

## 2023-04-28 MED ORDER — TRAMADOL HCL 50 MG PO TABS
50.0000 mg | ORAL_TABLET | Freq: Four times a day (QID) | ORAL | Status: DC
Start: 2023-04-28 — End: 2023-04-30
  Administered 2023-04-28 – 2023-04-30 (×6): 50 mg via ORAL
  Filled 2023-04-28 (×6): qty 1

## 2023-04-28 MED ORDER — ONDANSETRON HCL 4 MG/2ML IJ SOLN
INTRAMUSCULAR | Status: DC | PRN
  Administered 2023-04-28: 4 mg via INTRAVENOUS

## 2023-04-28 MED ORDER — VANCOMYCIN HCL 1000 MG IV SOLR
INTRAVENOUS | Status: DC | PRN
  Administered 2023-04-28: 2 g

## 2023-04-28 MED ORDER — APREPITANT 40 MG PO CAPS: 40.0000 mg | ORAL_CAPSULE | Freq: Once | ORAL | Status: DC

## 2023-04-28 MED ORDER — FENTANYL CITRATE (PF) 100 MCG/2ML IJ SOLN: 25.0000 ug | INTRAMUSCULAR | Status: DC | PRN

## 2023-04-28 SURGICAL SUPPLY — 71 items
BAG COUNTER SPONGE SURGICOUNT (BAG) ×1 IMPLANT
BANDAGE ESMARK 6X9 LF (GAUZE/BANDAGES/DRESSINGS) IMPLANT
BNDG COHESIVE 4X5 TAN STRL (GAUZE/BANDAGES/DRESSINGS) IMPLANT
BNDG ELASTIC 4X5.8 VLCR STR LF (GAUZE/BANDAGES/DRESSINGS) IMPLANT
BNDG ELASTIC 6X5.8 VLCR STR LF (GAUZE/BANDAGES/DRESSINGS) IMPLANT
BNDG ESMARK 6X9 LF (GAUZE/BANDAGES/DRESSINGS)
BNDG GAUZE DERMACEA FLUFF 4 (GAUZE/BANDAGES/DRESSINGS) ×1 IMPLANT
BUR PRESCISION 1.7 ELITE (BURR) IMPLANT
CEMENT BONE REFOBACIN R1X40 US (Cement) IMPLANT
CLSR STERI-STRIP ANTIMIC 1/2X4 (GAUZE/BANDAGES/DRESSINGS) IMPLANT
COOLER ICEMAN CLASSIC (MISCELLANEOUS) IMPLANT
COVER SURGICAL LIGHT HANDLE (MISCELLANEOUS) ×1 IMPLANT
CUFF TOURN SGL QUICK 34 (TOURNIQUET CUFF)
CUFF TOURN SGL QUICK 42 (TOURNIQUET CUFF) IMPLANT
CUFF TRNQT CYL 34X4.125X (TOURNIQUET CUFF) IMPLANT
DRAPE C-ARM 42X72 X-RAY (DRAPES) IMPLANT
DRAPE HALF SHEET 40X57 (DRAPES) IMPLANT
DRAPE INCISE IOBAN 66X45 STRL (DRAPES) IMPLANT
DRAPE SURG ORHT 6 SPLT 77X108 (DRAPES) IMPLANT
DRSG AQUACEL AG ADV 3.5X 6 (GAUZE/BANDAGES/DRESSINGS) IMPLANT
DRSG AQUACEL AG ADV 3.5X10 (GAUZE/BANDAGES/DRESSINGS) IMPLANT
DRSG EMULSION OIL 3X3 NADH (GAUZE/BANDAGES/DRESSINGS) ×1 IMPLANT
ELECT REM PT RETURN 9FT ADLT (ELECTROSURGICAL) ×1
ELECTRODE REM PT RTRN 9FT ADLT (ELECTROSURGICAL) ×1 IMPLANT
GAUZE PAD ABD 8X10 STRL (GAUZE/BANDAGES/DRESSINGS) ×1 IMPLANT
GAUZE SPONGE 4X4 12PLY STRL (GAUZE/BANDAGES/DRESSINGS) ×1 IMPLANT
GAUZE XEROFORM 1X8 LF (GAUZE/BANDAGES/DRESSINGS) ×1 IMPLANT
GLOVE BIOGEL PI IND STRL 8 (GLOVE) ×1 IMPLANT
GLOVE ECLIPSE 8.0 STRL XLNG CF (GLOVE) ×1 IMPLANT
GOWN STRL REUS W/ TWL LRG LVL3 (GOWN DISPOSABLE) ×3 IMPLANT
GOWN STRL REUS W/TWL LRG LVL3 (GOWN DISPOSABLE) ×3
KIT BASIN OR (CUSTOM PROCEDURE TRAY) ×1 IMPLANT
KIT INTERSPACE SHOULDER (Miscellaneous) IMPLANT
KIT TURNOVER KIT B (KITS) ×1 IMPLANT
MANIFOLD NEPTUNE II (INSTRUMENTS) ×1 IMPLANT
MARKER SKIN DUAL TIP RULER LAB (MISCELLANEOUS) IMPLANT
NDL 1/2 CIR MAYO (NEEDLE) IMPLANT
NEEDLE 1/2 CIR MAYO (NEEDLE) ×1
NS IRRIG 1000ML POUR BTL (IV SOLUTION) ×1 IMPLANT
PACK GENERAL/GYN (CUSTOM PROCEDURE TRAY) ×1 IMPLANT
PAD ARMBOARD 7.5X6 YLW CONV (MISCELLANEOUS) ×2 IMPLANT
PAD CAST 4YDX4 CTTN HI CHSV (CAST SUPPLIES) ×2 IMPLANT
PAD COLD SHLDR WRAP-ON (PAD) IMPLANT
PADDING CAST COTTON 4X4 STRL (CAST SUPPLIES) ×2
PADDING CAST COTTON 6X4 STRL (CAST SUPPLIES) ×2 IMPLANT
RETRIEVER SUT HEWSON (MISCELLANEOUS) IMPLANT
SLING ARM IMMOBILIZER XL (CAST SUPPLIES) IMPLANT
SOLUTION IRRIG SURGIPHOR (IV SOLUTION) IMPLANT
SPONGE T-LAP 18X18 ~~LOC~~+RFID (SPONGE) IMPLANT
STAPLER VISISTAT 35W (STAPLE) ×1 IMPLANT
STOCKINETTE IMPERVIOUS 9X36 MD (GAUZE/BANDAGES/DRESSINGS) IMPLANT
STRIP CLOSURE SKIN 1/2X4 (GAUZE/BANDAGES/DRESSINGS) IMPLANT
SUT ETHILON 4 0 FS 1 (SUTURE) IMPLANT
SUT MNCRL AB 3-0 PS2 27 (SUTURE) IMPLANT
SUT VIC AB 0 CT1 27 (SUTURE) ×4
SUT VIC AB 0 CT1 27XBRD ANBCTR (SUTURE) IMPLANT
SUT VIC AB 1 CT1 18XCR BRD 8 (SUTURE) IMPLANT
SUT VIC AB 1 CT1 27 (SUTURE) ×4
SUT VIC AB 1 CT1 27XBRD ANBCTR (SUTURE) IMPLANT
SUT VIC AB 1 CT1 8-18 (SUTURE) ×1
SUT VIC AB 2 TP1 27 (SUTURE) IMPLANT
SUT VIC AB 2-0 CT1 27 (SUTURE)
SUT VIC AB 2-0 CT1 TAPERPNT 27 (SUTURE) IMPLANT
SUT VIC AB 2-0 CT2 27 (SUTURE) IMPLANT
SUT VIC AB CT1 27XBRD ANBCTRL (SUTURE) ×4
SUT VICRYL 0 UR6 27IN ABS (SUTURE) IMPLANT
SWAB COLLECTION DEVICE MRSA (MISCELLANEOUS) IMPLANT
SWAB CULTURE ESWAB REG 1ML (MISCELLANEOUS) IMPLANT
TOWEL GREEN STERILE (TOWEL DISPOSABLE) ×1 IMPLANT
TOWEL GREEN STERILE FF (TOWEL DISPOSABLE) ×1 IMPLANT
WATER STERILE IRR 1000ML POUR (IV SOLUTION) ×1 IMPLANT

## 2023-04-28 NOTE — Telephone Encounter (Signed)
We will do in house tomorrow thx

## 2023-04-28 NOTE — OR Nursing (Signed)
All preexisting implants in right shoulder removed by Dr. August Saucer. Souse notified via telephone. No existing implants in chart to remove at this time.

## 2023-04-28 NOTE — Anesthesia Procedure Notes (Signed)
Anesthesia Regional Block: Interscalene brachial plexus block   Pre-Anesthetic Checklist: , timeout performed,  Correct Patient, Correct Site, Correct Laterality,  Correct Procedure, Correct Position, site marked,  Risks and benefits discussed,  Surgical consent,  Pre-op evaluation,  At surgeon's request and post-op pain management  Laterality: Right  Prep: Maximum Sterile Barrier Precautions used, chloraprep       Needles:  Injection technique: Single-shot  Needle Type: Echogenic Needle      Needle Gauge: 20     Additional Needles:   Procedures:,,,, ultrasound used (permanent image in chart),,    Narrative:  Start time: 04/28/2023 7:00 AM End time: 04/28/2023 7:05 AM Injection made incrementally with aspirations every 5 mL.  Performed by: Personally  Anesthesiologist: Mariann Barter, MD

## 2023-04-28 NOTE — Brief Op Note (Signed)
   04/28/2023  12:22 PM  PATIENT:  Joseph [name]  67 y.o. male  PRE-OPERATIVE DIAGNOSIS:  right total shoulder with particle wear  POST-OPERATIVE DIAGNOSIS:  right total shoulder with particle wear, possible infection  PROCEDURE:  Procedure(s): REMOVAL RIGHT SHOULDER IMPLANTS, placement antibiotic spacer  SURGEON:  Surgeon(s): August Saucer, Corrie Mckusick, MD  ASSISTANT: Magnant PA  ANESTHESIA:   General  EBL: 150 ml    Total I/O In: 1700 [I.V.:1250; IV Piggyback:450] Out: 550 [Urine:400; Blood:150]  BLOOD ADMINISTERED: none  DRAINS: none   LOCAL MEDICATIONS USED:  none  SPECIMEN: Cultures x 5 sent from adjacent to the implants on both the humeral and glenoid side as well as tissue from within the humeral canal and inside the glenoid itself.  Intraoperative acute inflammation noted with greater than 4 polys per high-powered field on frozen section  COUNTS:  YES  TOURNIQUET:  * No tourniquets in log *  DICTATION: .Other Dictation: Dictation Number [id number]  PLAN OF CARE: Admit for overnight observation  PATIENT DISPOSITION:  PACU - hemodynamically stable  Although there was evidence of acute inflammation on the frozen sections in general the clinical scenario is more consistent with polyethylene wear.  No significant effusion present in the joint.  We will see what the cultures show but plan for IV antibiotics for at least 3 weeks until cultures come back negative.  If all cultures are negative then we will plan for reimplantation within 6 weeks.           Marland Kitchen

## 2023-04-28 NOTE — Anesthesia Procedure Notes (Signed)
Procedure Name: Intubation Date/Time: 04/28/2023 7:46 AM  Performed by: Marena Chancy, CRNAPre-anesthesia Checklist: Patient identified, Emergency Drugs available, Suction available and Patient being monitored Patient Re-evaluated:Patient Re-evaluated prior to induction Oxygen Delivery Method: Circle System Utilized Preoxygenation: Pre-oxygenation with 100% oxygen Induction Type: IV induction Ventilation: Mask ventilation without difficulty and Oral airway inserted - appropriate to patient size Laryngoscope Size: Hyacinth Meeker and 2 Grade View: Grade I Tube type: Oral Tube size: 7.5 mm Number of attempts: 1 Airway Equipment and Method: Stylet and Oral airway Placement Confirmation: ETT inserted through vocal cords under direct vision, positive ETCO2 and breath sounds checked- equal and bilateral Tube secured with: Tape Dental Injury: Teeth and Oropharynx as per pre-operative assessment

## 2023-04-28 NOTE — Plan of Care (Signed)

## 2023-04-28 NOTE — H&P (Signed)
Joseph [name] is an 67 y.o. male.   Chief Complaint: right shoulder pain HPI:  Joseph [name] is a 67 y.o. male who presents to treporting right shoulder pain and stiffness.  States that the shoulder is sore.  He has a history of total shoulder replacement in 2013 in [place].  Does report some weakness.  Denies any fevers or chills.  Did have lab work done earlier this summer which was negative for infection. 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  He describes having had multiple dislocations for which he had surgery in the 70s.  Subsequently developed arthritis in the shoulder.  Patient states that pain medications do not really work for him.  He has never tried tramadol.  Takes anti-inflammatories only.  Also has a history of esophageal stricture.  He is retired.  Here with his wife today.   CT scan is reviewed and it does show cystic changes consistent with likely loosening of that glenoid prosthesis.  There is also some erosive changes in the metaphysis of the humerus which could also be consistent with particle disease.Marland Kitchen    Past Medical History:  Diagnosis Date   Arthritis    Complication of anesthesia    GERD (gastroesophageal reflux disease)    Heart murmur    as a child   Hypertension    Mixed hyperlipidemia    Osteoarthritis    Pneumonia    PONV (postoperative nausea and vomiting)    Prediabetes    Rheumatic fever    Vertigo     Past Surgical History:  Procedure Laterality Date   BACK SURGERY  2012   JOINT REPLACEMENT Right 2013   SPINE SURGERY  1995   C4-5, C5-C6    TONSILLECTOMY  1963    Family History  Problem Relation Age of Onset   Arthritis Mother        Rheumatoid   Diabetes Mother    Arthritis Father    Hypertension Father    Diabetes Brother    Lupus Brother    Social History:  reports that he quit smoking about 33 years ago. His smoking use included cigarettes. He started smoking about 53 years  ago. He has a 30 pack-year smoking history. He has never used smokeless tobacco. He reports that he does not drink alcohol and does not use drugs.  Allergies:  Allergies  Allergen Reactions   Hydrocodone-Acetaminophen Nausea Only and Nausea And Vomiting   Other     Intolerance to narcotic medication - causes stomach upset and dizziness and does not relieve pain.     Medications Prior to Admission  Medication Sig Dispense Refill   amLODipine (NORVASC) 5 MG tablet Take 1 tablet (5 mg total) by mouth daily. 90 tablet 2   atorvastatin (LIPITOR) 40 MG tablet Take 1 tablet by mouth once daily (Patient taking differently: Take 40 mg by mouth every evening.) 90 tablet 0   Diclofenac Sodium 1.5 % SOLN APPLY TOPICALLY 3 TIMES DAILY AS NEEDED FOR PAIN 150 mL 0   lidocaine (LIDODERM) 5 % Place 1 patch onto the skin daily. Remove & Discard patch within 12 hours or as directed by MD (Patient taking differently: Place 1 patch onto the skin daily as needed (pain.). Remove & Discard patch within 12 hours or as directed by MD) 30 patch 0   meloxicam (MOBIC) 7.5 MG tablet TAKE 1 TABLET BY MOUTH ONCE DAILY AS NEEDED FOR PAIN (Patient taking  differently: Take 7.5 mg by mouth at bedtime. TAKE 1 TABLET BY MOUTH ONCE DAILY AS NEEDED FOR PAIN) 90 tablet 0   pantoprazole (PROTONIX) 40 MG tablet Take 1 tablet by mouth twice daily 152 tablet 0   tiZANidine (ZANAFLEX) 4 MG tablet TAKE 1 TABLET BY MOUTH EVERY 6 HOURS AS NEEDED FOR MUSCLE SPASM (Patient not taking: Reported on 04/15/2023) 30 tablet 0   traMADol (ULTRAM) 50 MG tablet Take 1 tablet (50 mg total) by mouth every 12 (twelve) hours as needed. 30 tablet 0    No results found for this or any previous visit (from the past 48 hour(s)). No results found.  Review of Systems  Musculoskeletal:  Positive for arthralgias.  All other systems reviewed and are negative.   Blood pressure (!) 153/84, pulse 65, temperature 97.9 F (36.6 C), resp. rate 18, SpO2  97%. Physical Exam Vitals reviewed.  HENT:     Head: Normocephalic.     Nose: Nose normal.     Mouth/Throat:     Mouth: Mucous membranes are moist.  Eyes:     Pupils: Pupils are equal, round, and reactive to light.  Cardiovascular:     Rate and Rhythm: Normal rate.     Pulses: Normal pulses.  Pulmonary:     Effort: Pulmonary effort is normal.  Abdominal:     General: Abdomen is flat.  Musculoskeletal:     Cervical back: Normal range of motion.  Skin:    General: Skin is warm.     Capillary Refill: Capillary refill takes less than 2 seconds.  Neurological:     General: No focal deficit present.     Mental Status: He is alert.  Psychiatric:        Mood and Affect: Mood normal.    Ortho exam demonstrates multiple surgical incisions on the right shoulder. Deltoid does fire. Rotator cuff strength is actually pretty good to external rotation at 5 out of 5 and subscap strength also 5 out of 5. No masses lymphadenopathy or skin changes noted in that shoulder girdle region. Radial pulse intact.  Active forward flexion is approximately 90 degrees.  Active abduction also approximately 85 degrees.  Assessment/Plan Impression is right shoulder particle disease and a total shoulder replacement that is approximately 67 years old.  CT scan shows loosening of the glenoid component with cystic changes behind the glenoid.  Appears to be a contained defect that measures approximately 1-1/2 x 2-1/2 cm.  There is also evidence of particle disease affecting the metaphysis of the humerus.  Slight lucent line around the humerus indicates the possibility of some loosening.  He did have infection workup this summer which was negative.  Infection does remain a concern.  Plan at this time is implant removal with intraoperative frozen section and assessment of acute inflammatory changes.  We will also send cultures.  Most likely scenario is implant removal with placement of a spacer and later reconstruction of the  glenoid with a vault reconstruction system with subsequent reverse shoulder replacement.  If we can achieve 50% baseplate contact today with no evidence of acute inflammation on pathologic examination it may be remotely possible to perform this revision surgery in a 1 stage procedure.  Humeral fixation may require a longer stem depending on bone quality proximally.  The risk and benefits of the procedure are discussed with the patient including not limited to infection nerve and vessel damage instability incomplete restoration of function as well as incomplete pain relief.  Patient understands  the risk and benefits and wishes to proceed.  The glenoid defect which will really be the determining factor in the surgery today appears to be a contained but large cystic defect.  We will have to further assess that at the time of surgery.  All questions answered.  Burnard Bunting, MD 04/28/2023, 6:51 AM

## 2023-04-28 NOTE — Op Note (Signed)
NAMEPAYCEN, CASEBOLT MEDICAL RECORD NO: [id number] ACCOUNT NO: [id number] DATE OF BIRTH: Aug 01, 1955 FACILITY: [facility] LOCATION: [facility] PHYSICIAN: Graylin Shiver. 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  PROCEDURE:  Right shoulder removal of implants with extensive debridement and placement of antibiotic spacer.  SURGEON:  Graylin Shiver. August Saucer, MD  ASSISTANT:  Karenann Cai, PA  INDICATIONS:  This is a 67 year old patient who is now about 11 years out from right total shoulder replacement done in [place].  He has been having increasing pain in the shoulder.  MRI scan shows findings consistent with cavitary defect in the glenoid  as well as loosening of the humeral implant.  Blood workup earlier this summer was negative for infection.  He presents now for operative management after explanation of risks and benefits.  DESCRIPTION OF PROCEDURE:  The patient was brought to the operating room where general anesthetic was induced.  Perioperative IV antibiotics were started.  Right shoulder, arm, and hand prescrubbed with hydrogen peroxide followed by alcohol and Betadine,  which was allowed to air dry.  ChloraPrep solution was then utilized.  After calling timeout, prior deltopectoral incision was made.  Plane between the deltoid and pectoralis was developed.  Deltoid elevated manually off its anterior attachment on the  deltoid.  Subacromial and subdeltoid adhesions were released.  The subscapularis was actually intact attached to the lesser tuberosity.  Superior and posterior rotator cuff also intact.  There was evidence of particle wear once we took down the  subscapularis.  That whole sleeve of tissue was taken down about 3 cm below the humeral neck.  It should be noted that the patient's  preop range of motion was limited to 10 degrees of external rotation, 70 of abduction, and about 85 of forward flexion.   Subscapularis was indeed tight.  Rotator interval was opened.  No fluid under pressure was present within the glenohumeral joint.  The subscapularis and sleeve of tissue was then dissected off and we actually carried that dissection around to about the 5  o'clock position on the humeral neck.  Part of the pec tendon was also released.  Part of the CA ligament also required release.  At this time, there was evidence of particle wear and dissolution of the medial metaphyseal bone.  Some of this was sent  for culture.  At this time using a small osteotome, the interval between the implant and the humerus was developed.  The implant was then removed.  It was reasonably well fixed proximally.  There was some cement proximally.  This was removed as well.  At  this time, specimen from the canal was sent.  The canal was then reamed by hand up to a size 15.  The implant removed was a size 15 implant with 46 head.  After thorough excisional debridement of tissue within the canal, the glenoid was then inspected.   Care was taken to avoid injury to the axillary nerve.  Soft tissue dissection was performed around the glenoid.  Posterior retractors were placed.  The glenoid was then sectioned and removed.  It was grossly loose.  Cultures were obtained from  underneath the implant surface as well as within the cavitary as well as within the glenoid itself.  Significant wear was present.  At this time, intraoperative frozen section demonstrated greater than 4  polys per high-powered field and decision made at  this time with implant removal and two-stage reimplantation.  Once the glenoid was debrided of cystic tissue, there was no bony stock left centrally to support a non-personalized glenoid component.  For that reason, vault reconstruction will be required  for the next stage.  Thorough debridement was  performed with the curettes.  Following extensive debridement of all cystic and devitalized appearing tissue around the glenoid, thorough irrigation was performed with 9 liters of irrigating solution.  We  also allowed Betadine type solution to sit within that glenoid cavity within the humeral shaft as well as in the glenohumeral joint itself for approximately 7 minutes.  This was removed and at this time pulsatile irrigation was again performed.  Cement  spacer from [employer] measuring 11 mm distal stem with 46 mm humeral head was placed and sealed around the proximal humerus, which had some metaphyseal defect using antibiotic vancomycin impregnated cement.  We put that in about 30 degrees of  retroversion.  The head did aim nicely to the cavitary defect in the glenoid.  Cement hardening occurred.  We were able to repair the subscapularis with the arm in neutral position using Vicryl sutures.  Thorough irrigation again performed with IrriSept  solution.  We placed vancomycin powder 1 g into the glenohumeral joint.  The deltopectoral interval was then closed using #1 Vicryl suture followed by interrupted inverted 0 Vicryl suture, 2-0 Vicryl suture, and 3-0 Monocryl with Steri-Strips and Aquacel  dressing placed.  Plan at this time is to keep the cultures for at least 21 days, consult ID and plan for CT scan thin cut in this hospitalization to allow for preop templating of the vault reconstruction system.  Luke's assistance was required at all  times for retraction, opening and closing, mobilization of tissue.  His assistance was a medical necessity.   PUS D: 04/28/2023 12:31:35 pm T: 04/28/2023 1:36:00 pm  JOB: [id number]/ [id number]

## 2023-04-28 NOTE — Transfer of Care (Signed)
Immediate Anesthesia Transfer of Care Note  Patient: Joseph [name]  Procedure(s) Performed: REMOVAL RIGHT SHOULDER IMPLANTS (Right: Shoulder)  Patient Location: PACU  Anesthesia Type:General  Level of Consciousness: drowsy  Airway & Oxygen Therapy: Patient Spontanous Breathing and Patient connected to face mask oxygen  Post-op Assessment: Report given to RN and Post -op Vital signs reviewed and stable  Post vital signs: stable  Last Vitals:  Vitals Value Taken Time  BP 105/64 04/28/23 1217  Temp    Pulse 66 04/28/23 1220  Resp 14 04/28/23 1220  SpO2 100 % 04/28/23 1220  Vitals shown include unfiled device data.  Last Pain:  Vitals:   04/28/23 0559  PainSc: 6          Complications: No notable events documented.

## 2023-04-29 ENCOUNTER — Inpatient Hospital Stay (HOSPITAL_COMMUNITY): Payer: Medicare Other

## 2023-04-29 ENCOUNTER — Other Ambulatory Visit: Payer: Self-pay

## 2023-04-29 ENCOUNTER — Encounter (HOSPITAL_COMMUNITY): Payer: Self-pay | Admitting: Orthopedic Surgery

## 2023-04-29 ENCOUNTER — Other Ambulatory Visit (HOSPITAL_COMMUNITY): Payer: Self-pay

## 2023-04-29 DIAGNOSIS — T8450XA Infection and inflammatory reaction due to unspecified internal joint prosthesis, initial encounter: Secondary | ICD-10-CM

## 2023-04-29 LAB — CK: Total CK: 229 U/L (ref 49–397)

## 2023-04-29 LAB — CBC WITH DIFFERENTIAL/PLATELET
Abs Immature Granulocytes: 0.04 10*3/uL (ref 0.00–0.07)
Basophils Absolute: 0 10*3/uL (ref 0.0–0.1)
Basophils Relative: 0 %
Eosinophils Absolute: 0 10*3/uL (ref 0.0–0.5)
Eosinophils Relative: 0 %
HCT: 35.9 % — ABNORMAL LOW (ref 39.0–52.0)
Hemoglobin: 12 g/dL — ABNORMAL LOW (ref 13.0–17.0)
Immature Granulocytes: 0 %
Lymphocytes Relative: 7 %
Lymphs Abs: 1.2 10*3/uL (ref 0.7–4.0)
MCH: 30.3 pg (ref 26.0–34.0)
MCHC: 33.4 g/dL (ref 30.0–36.0)
MCV: 90.7 fL (ref 80.0–100.0)
Monocytes Absolute: 1.4 10*3/uL — ABNORMAL HIGH (ref 0.1–1.0)
Monocytes Relative: 7 %
Neutro Abs: 15.9 10*3/uL — ABNORMAL HIGH (ref 1.7–7.7)
Neutrophils Relative %: 86 %
Platelets: 200 10*3/uL (ref 150–400)
RBC: 3.96 MIL/uL — ABNORMAL LOW (ref 4.22–5.81)
RDW: 13 % (ref 11.5–15.5)
WBC: 18.5 10*3/uL — ABNORMAL HIGH (ref 4.0–10.5)
nRBC: 0 % (ref 0.0–0.2)

## 2023-04-29 LAB — COMPREHENSIVE METABOLIC PANEL
ALT: 17 U/L (ref 0–44)
AST: 17 U/L (ref 15–41)
Albumin: 3.4 g/dL — ABNORMAL LOW (ref 3.5–5.0)
Alkaline Phosphatase: 64 U/L (ref 38–126)
Anion gap: 8 (ref 5–15)
BUN: 28 mg/dL — ABNORMAL HIGH (ref 8–23)
CO2: 24 mmol/L (ref 22–32)
Calcium: 8.8 mg/dL — ABNORMAL LOW (ref 8.9–10.3)
Chloride: 104 mmol/L (ref 98–111)
Creatinine, Ser: 1.84 mg/dL — ABNORMAL HIGH (ref 0.61–1.24)
GFR, Estimated: 39 mL/min — ABNORMAL LOW (ref >60)
Glucose, Bld: 122 mg/dL — ABNORMAL HIGH (ref 70–99)
Potassium: 4.3 mmol/L (ref 3.5–5.1)
Sodium: 136 mmol/L (ref 135–145)
Total Bilirubin: 0.2 mg/dL (ref <1.2)
Total Protein: 5.6 g/dL — ABNORMAL LOW (ref 6.5–8.1)

## 2023-04-29 LAB — C-REACTIVE PROTEIN: CRP: 1.2 mg/dL — ABNORMAL HIGH (ref <1.0)

## 2023-04-29 LAB — SEDIMENTATION RATE: Sed Rate: 4 mm/h (ref 0–16)

## 2023-04-29 MED ORDER — CHLORHEXIDINE GLUCONATE CLOTH 2 % EX PADS
6.0000 | MEDICATED_PAD | Freq: Every day | CUTANEOUS | Status: DC
  Administered 2023-04-30: 6 via TOPICAL

## 2023-04-29 MED ORDER — DAPTOMYCIN IV (FOR PTA / DISCHARGE USE ONLY): 700.0000 mg | INTRAVENOUS | 0 refills | Status: DC

## 2023-04-29 MED ORDER — DOCUSATE SODIUM 100 MG PO CAPS
100.0000 mg | ORAL_CAPSULE | Freq: Two times a day (BID) | ORAL | 0 refills | Status: DC
  Filled 2023-04-29: qty 10, 5d supply, fill #0

## 2023-04-29 MED ORDER — CEFTRIAXONE IV (FOR PTA / DISCHARGE USE ONLY): 2.0000 g | INTRAVENOUS | 0 refills | Status: DC

## 2023-04-29 MED ORDER — HYDROMORPHONE HCL 2 MG PO TABS
1.0000 mg | ORAL_TABLET | ORAL | 0 refills | Status: DC | PRN
  Filled 2023-04-29: qty 30, 10d supply, fill #0

## 2023-04-29 MED ORDER — METHOCARBAMOL 500 MG PO TABS
500.0000 mg | ORAL_TABLET | Freq: Three times a day (TID) | ORAL | 0 refills | Status: DC | PRN
  Filled 2023-04-29: qty 30, 10d supply, fill #0

## 2023-04-29 MED ORDER — SODIUM CHLORIDE 0.9% FLUSH
10.0000 mL | INTRAVENOUS | Status: DC | PRN
  Administered 2023-04-30: 10 mL

## 2023-04-29 NOTE — Progress Notes (Signed)
  Subjective: Patient stable.  Pain controlled reasonably well on Dilaudid and current regimen.  Block wearing off.   Objective: Vital signs in last 24 hours: Temp:  [97.3 F (36.3 C)-98.3 F (36.8 C)] 97.8 F (36.6 C) (11/15 0421) Pulse Rate:  [64-74] 68 (11/15 0421) Resp:  [13-20] 18 (11/15 0421) BP: (95-119)/(63-83) 100/64 (11/15 0421) SpO2:  [92 %-100 %] 96 % (11/15 0421) Weight:  [98.2 kg] 98.2 kg (11/14 2357)  Intake/Output from previous day: 11/14 0701 - 11/15 0700 In: 2140 [P.O.:240; I.V.:1250; IV Piggyback:650] Out: 850 [Urine:700; Blood:150] Intake/Output this shift: No intake/output data recorded.  Exam:  No cellulitis present Compartment soft  Labs: No results for input(s): "HGB" in the last 72 hours. No results for input(s): "WBC", "RBC", "HCT", "PLT" in the last 72 hours. No results for input(s): "NA", "K", "CL", "CO2", "BUN", "CREATININE", "GLUCOSE", "CALCIUM" in the last 72 hours. No results for input(s): "LABPT", "INR" in the last 72 hours.  Assessment/Plan: Plan at this time is to make sure the patient's pain is controlled.  Plan for thin cut CT scan today for preop planning for surgery in approximately 6 weeks if infection is not present.  Awaiting final recommendations from infectious disease on type and duration of antibiotics.  Currently the patient is on Cubicin and ceftriaxone.  He may or may not need a PICC line.  That could be performed today if needed.   Marrianne Mood Ely Ballen 04/29/2023, 7:32 AM

## 2023-04-29 NOTE — Evaluation (Signed)
Occupational Therapy Evaluation Patient Details Name: Joseph [name] MRN: [id number] DOB: 04/14/1956 Today's Date: 04/29/2023   History of Present Illness Pt is a 67 y/o male presenting on 11/14 with R shoulder pain. 11/14 s/p R shoulder removal of implants with extensive debridement and placement of antibiotic spacer. PMH includes: arthritis, HTN, PNA, rheumatic fever, vertigo, cervical surgery 1995, R shoulder replacement 2013.   Clinical Impression   PTA patient independent and driving. Admitted for above and presents with problem list below.  He was educated on shoulder precautions and NWB status, sling mgmt and wear schedule, exercises to elbow/wrist/hand, and positioning, as well as ADL compensatory techniques. Spouse present and very supportive, able to assist at dc as needed.  Today, pt requires supervision for transfers and bed mobility, setup to mod assist for ADLs. Will follow acutely, but will defer follow up to surgeon.       If plan is discharge home, recommend the following: A little help with walking and/or transfers;A lot of help with bathing/dressing/bathroom;Assistance with cooking/housework;Assist for transportation;Help with stairs or ramp for entrance    Functional Status Assessment  Patient has had a recent decline in their functional status and demonstrates the ability to make significant improvements in function in a reasonable and predictable amount of time.  Equipment Recommendations  None recommended by OT    Recommendations for Other Services       Precautions / Restrictions Precautions Precautions: Fall;Shoulder Type of Shoulder Precautions: No shoulder ROM, okay for hand/wrist/elbow ROM Shoulder Interventions: Shoulder sling/immobilizer;Off for dressing/bathing/exercises;At all times Precaution Booklet Issued: Yes (comment) Precaution Comments: reviewed with pt and spouse Required Braces or Orthoses: Sling Restrictions Weight Bearing Restrictions:  Yes RUE Weight Bearing: Non weight bearing      Mobility Bed Mobility Overal bed mobility: Needs Assistance Bed Mobility: Sit to Supine       Sit to supine: Supervision   General bed mobility comments: increased time but no physical assist required    Transfers Overall transfer level: Needs assistance   Transfers: Sit to/from Stand Sit to Stand: Supervision                  Balance Overall balance assessment: Mild deficits observed, not formally tested                                         ADL either performed or assessed with clinical judgement   ADL Overall ADL's : Needs assistance/impaired     Grooming: Set up;Sitting   Upper Body Bathing: Moderate assistance;Sitting   Lower Body Bathing: Supervison/ safety;Sitting/lateral leans;Sit to/from stand   Upper Body Dressing : Sitting;Minimal assistance   Lower Body Dressing: Minimal assistance;Sit to/from stand;Sitting/lateral leans   Toilet Transfer: Supervision/safety   Toileting- Clothing Manipulation and Hygiene: Minimal assistance;Sitting/lateral lean;Sit to/from stand       Functional mobility during ADLs: Supervision/safety General ADL Comments: pt reports ambulating to bathroom with supervision, returning back to bed upon OT entry     Vision   Vision Assessment?: No apparent visual deficits     Perception         Praxis         Pertinent Vitals/Pain Pain Assessment Pain Assessment: Faces Faces Pain Scale: Hurts little more Pain Location: R shoulder Pain Descriptors / Indicators: Discomfort, Operative site guarding Pain Intervention(s): Limited activity within patient's tolerance, Monitored during session, Repositioned     Extremity/Trunk  Assessment Upper Extremity Assessment Upper Extremity Assessment: Right hand dominant;RUE deficits/detail RUE Deficits / Details: s/p R shoulder surgery, in sling; WFL elbow/wrist/hand ROM RUE Sensation: decreased light  touch RUE Coordination: decreased fine motor;decreased gross motor   Lower Extremity Assessment Lower Extremity Assessment: Overall WFL for tasks assessed       Communication Communication Communication: No apparent difficulties   Cognition Arousal: Alert Behavior During Therapy: WFL for tasks assessed/performed Overall Cognitive Status: Within Functional Limits for tasks assessed                                       General Comments  Educated pt and spouse on shoulder instructions, positioning and safety.    Exercises Exercises: Shoulder, Other exercises Shoulder Exercises Elbow Flexion: AROM, Right, 10 reps, Supine Elbow Extension: AROM, Right, 10 reps, Supine Wrist Flexion: AROM, Right, 10 reps, Supine Wrist Extension: AROM, Right, 10 reps, Supine Digit Composite Flexion: AROM, Right, 10 reps, Supine Composite Extension: AROM, Right, 10 reps, Supine Other Exercises Other Exercises: AROM Right 10 reps supination/pronation   Shoulder Instructions Shoulder Instructions Donning/doffing shirt without moving shoulder: Minimal assistance Method for sponge bathing under operated UE: Minimal assistance Donning/doffing sling/immobilizer: Minimal assistance Correct positioning of sling/immobilizer: Maximal assistance ROM for elbow, wrist and digits of operated UE: Minimal assistance Sling wearing schedule (on at all times/off for ADL's): Supervision/safety Proper positioning of operated UE when showering: Minimal assistance Positioning of UE while sleeping: Supervision/safety    Home Living Family/patient expects to be discharged to:: Private residence Living Arrangements: Spouse/significant other Available Help at Discharge: Family;Available 24 hours/day Type of Home: House Home Access: Level entry     Home Layout: One level     Bathroom Shower/Tub: Producer, television/film/video: Handicapped height     Home Equipment: Shower seat;Hand held shower  head          Prior Functioning/Environment Prior Level of Function : Independent/Modified Independent;Driving                        OT Problem List: Decreased strength;Decreased activity tolerance;Pain;Impaired UE functional use;Decreased knowledge of precautions;Decreased knowledge of use of DME or AE      OT Treatment/Interventions: Self-care/ADL training;Therapeutic exercise;DME and/or AE instruction;Therapeutic activities;Patient/family education    OT Goals(Current goals can be found in the care plan section) Acute Rehab OT Goals Patient Stated Goal: home OT Goal Formulation: With patient Time For Goal Achievement: 05/13/23 Potential to Achieve Goals: Good  OT Frequency: Min 1X/week    Co-evaluation              AM-PAC OT "6 Clicks" Daily Activity     Outcome Measure Help from another person eating meals?: A Little Help from another person taking care of personal grooming?: A Little Help from another person toileting, which includes using toliet, bedpan, or urinal?: A Little Help from another person bathing (including washing, rinsing, drying)?: A Lot Help from another person to put on and taking off regular upper body clothing?: A Lot Help from another person to put on and taking off regular lower body clothing?: A Little 6 Click Score: 16   End of Session Equipment Utilized During Treatment: Other (comment) (sling) Nurse Communication: Mobility status  Activity Tolerance: Patient tolerated treatment well Patient left: in bed;with call bell/phone within reach;with bed alarm set;with family/visitor present;with SCD's reapplied  OT Visit Diagnosis: Pain  Pain - Right/Left: Right Pain - part of body: Shoulder                Time: 6962-9528 OT Time Calculation (min): 34 min Charges:  OT General Charges $OT Visit: 1 Visit OT Evaluation $OT Eval Low Complexity: 1 Low OT Treatments $Self Care/Home Management : 8-22 mins  Barry Brunner, OT Acute  Rehabilitation Services Office [phone]   Chancy Milroy 04/29/2023, 1:26 PM

## 2023-04-29 NOTE — Consult Note (Signed)
[facility]                                                                                        Patient Identification: Patient Name: Joseph [name] MRN: [id number] Admit Date: 04/28/2023  5:39 AM Today's Date: 04/29/2023 Reason for consult: PJI Requesting provider: Dr. August Saucer  Principal Problem:   OA (osteoarthritis) of shoulder Active Problems:   S/P hardware removal   Antibiotics:  Vancomycin 11/14 Daptomycin 11/14 Ceftriaxone 11/14  Lines/Hardware:  Assessment 67 year old male with PMH of rheumatic fever, prediabetes, HLD, HTN, OA, h/o multiple prior shoulder dislocations, right joint replacement in 2013 who presented with worsening right shoulder pain and stiffness.    # RT shoulder PJI vs Particle disease due to wear and tear( more likely) - 11/14 status post removal of implant from right shoulder with extensive debridement and placement of antibiotic spacer.  Per OR note the glenoid was grossly loose.  Significant wear was present.  Intraoperative frozen section with 4 polys per high-power field and hence decision was made for implant removal and two-stage reimplantation.  OR cultures no organism on Gram stain, no growth to date. - d/w Dr August Saucer, low suspicion for infection but cx might take 2-3 weeks to grow if P acnes,. Old hardware out, plan for new hardware in several weeks, so would presumptively treat for infection with PICC line and IV abtx. Patient and wife agreeable  Recommendations  - PICC - CBC, CMP<, ESR and CRP - Daptomycin and ceftriaxone for 6 weeks from 11/14.  Informed lab to hold cultures for 3 weeks for possibility of P acnes. - Monitor CBC, CMP and CPK Dr Renold Don follow cultures over the weekend. Otherwise ID will so.   OPAT  Diagnosis: probable PJI   Culture Result: ngtd  Allergies  Allergen Reactions   Hydrocodone-Acetaminophen Nausea Only and Nausea And  Vomiting   Other     Intolerance to narcotic medication - causes stomach upset and dizziness and does not relieve pain.     OPAT Orders Discharge antibiotics to be given via PICC line Discharge antibiotics: daptomycin 700 mg IV daily and ceftriaxone 2 g IV daily Per pharmacy protocol  Duration: 6 weeks, EOT 06/09/23  PIC Care Per Protocol:  Home health RN for IV administration and teaching; PICC line care and labs.    Labs weekly while on IV antibiotics: X__ CBC with differential __ BMP X__ CMP X__ CRP X__ ESR __ Vancomycin trough X__ CK  __ Please pull PIC at completion of IV antibiotics X__ Please leave PIC in place until doctor has seen patient or been notified  Fax weekly labs to [phone]  Clinic Follow Up Appt: 12/2 at 3: 45 pm   Rest of the management as per the primary team. Please call with questions or concerns.  Thank you for the consult  __________________________________________________________________________________________________________ HPI and Hospital Course: 67 year old male with PMH of rheumatic fever, prediabetes, HLD, HTN, OA, right shoulder joint replacement in 2013 who presented with worsening right shoulder pain and stiffness.  Spoke with patient and wife at bedside.  He has a h/o of multiple dislocation for  which he had surgery in the 70s subsequently he developed arthritis in the shoulder.  He reports he had built a house himself approximately a decade ago when he might have overdone with his right shoulder.  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  Reports stiffness in the right shoulder stiffness and loosening ongoing for a year but worsening lately.  Denies any prior fever or chills systemic symptoms like nausea, vomiting or malaise.  Denies any swelling, redness or tenderness in the right shoulder or taking antibiotics.  ESR and CRP in  August 2024 was within normal limit. He remains afebrile   CT 8/26 Right shoulder arthroplasty. Cystic area in the inferior glenoid measuring 2.1 x 1.2 cm concerning for particle disease. Beam hardening artifact resulting from the orthopedic hardware partially obscures adjacent soft tissue and osseous structures. Normal alignment. No periarticular fluid collection. No fracture or dislocation. Normal alignment. No joint effusion.   ROS: General- Denies fever, chills, loss of appetite and loss of weight HEENT - Denies headache, blurry vision, neck pain, sinus pain Chest - Denies any chest pain, SOB or cough CVS- Denies any dizziness/lightheadedness, syncopal attacks, palpitations Abdomen- Denies any nausea, vomiting, abdominal pain, hematochezia and diarrhea Neuro - Denies any weakness, numbness, tingling sensation Psych - Denies any changes in mood irritability or depressive symptoms GU- Denies any burning, dysuria, hematuria or increased frequency of urination Skin - denies any rashes/lesions MSK -soreness in the right shoulder  Past Medical History:  Diagnosis Date   Arthritis    Complication of anesthesia    GERD (gastroesophageal reflux disease)    Heart murmur    as a child   Hypertension    Mixed hyperlipidemia    Osteoarthritis    Pneumonia    PONV (postoperative nausea and vomiting)    Prediabetes    Rheumatic fever    Vertigo    Past Surgical History:  Procedure Laterality Date   BACK SURGERY  2012   JOINT REPLACEMENT Right 2013   SPINE SURGERY  1995   C4-5, C5-C6    TONSILLECTOMY  1963     Scheduled Meds:  acetaminophen  1,000 mg Oral Q6H   amLODipine  5 mg Oral Daily   aspirin EC  81 mg Oral Daily   celecoxib  100 mg Oral BID   docusate sodium  100 mg Oral BID   gabapentin  200 mg Oral TID   pantoprazole  40 mg Oral BID   traMADol  50 mg Oral Q6H   Continuous Infusions:  cefTRIAXone (ROCEPHIN)  IV 2 g (04/28/23 1814)   DAPTOmycin 700 mg (04/28/23  1703)   PRN Meds:.acetaminophen, HYDROmorphone (DILAUDID) injection, HYDROmorphone, menthol-cetylpyridinium **OR** phenol, methocarbamol **OR** methocarbamol (ROBAXIN) injection, metoCLOPramide **OR** metoCLOPramide (REGLAN) injection, ondansetron  Allergies  Allergen Reactions   Hydrocodone-Acetaminophen Nausea Only and Nausea And Vomiting   Other     Intolerance to narcotic medication - causes stomach upset and dizziness and does not relieve pain.    Social History   Socioeconomic History   Marital status: Married    Spouse name: Not on file   Number of children: 2   Years of education: 12   Highest education level: Not on file  Occupational History   Occupation: Disability    Comment: Shoulder / Back  Tobacco Use   Smoking status: Former    Current packs/day: 0.00  Average packs/day: 1.5 packs/day for 20.0 years (30.0 ttl pk-yrs)    Types: Cigarettes    Start date: 05/01/1970    Quit date: 05/01/1990    Years since quitting: 33.0   Smokeless tobacco: Never  Vaping Use   Vaping status: Never Used  Substance and Sexual Activity   Alcohol use: No    Alcohol/week: 0.0 standard drinks of alcohol   Drug use: No   Sexual activity: Yes    Partners: Female  Other Topics Concern   Not on file  Social History Narrative   Born and raised in [place], [place]. Currently resides in a private residence with wife Stanton Kidney). No pets. Fun: Work on classic cars;    Denies religious beliefs effecting health care.    Social Determinants of Health   Financial Resource Strain: Not on file  Food Insecurity: No Food Insecurity (04/28/2023)   Hunger Vital Sign    Worried About Running Out of Food in the Last Year: Never true    Ran Out of Food in the Last Year: Never true  Transportation Needs: No Transportation Needs (04/28/2023)   PRAPARE - Administrator, Civil Service (Medical): No    Lack of Transportation (Non-Medical): No  Physical Activity: Not on file  Stress:  Not on file  Social Connections: Not on file  Intimate Partner Violence: Not At Risk (04/28/2023)   Humiliation, Afraid, Rape, and Kick questionnaire    Fear of Current or Ex-Partner: No    Emotionally Abused: No    Physically Abused: No    Sexually Abused: No   Family History  Problem Relation Age of Onset   Arthritis Mother        Rheumatoid   Diabetes Mother    Arthritis Father    Hypertension Father    Diabetes Brother    Lupus Brother    Vitals BP 100/64 (BP Location: Left Arm)   Pulse 68   Temp 97.8 F (36.6 C) (Oral)   Resp 18   Ht 6\' 2"  (1.88 m)   Wt 98.2 kg   SpO2 96%   BMI 27.80 kg/m   Physical Exam Constitutional: Elderly male sitting in the bed, nontoxic-appearing    Comments: HEENT WNL  Cardiovascular:     Rate and Rhythm: Normal rate and regular rhythm.     Heart sounds: S1 and S2  Pulmonary:     Effort: Pulmonary effort is normal.     Comments: Normal lung sounds  Abdominal:     Palpations: Abdomen is soft.     Tenderness: Nondistended and nontender  Musculoskeletal:        General: No swelling or tenderness in peripheral joints.  Right upper extremity in a surgical bandage and arm sling  Skin:    Comments: No skin rashes  Neurological:     General: Awake, alert and oriented, following commands  Psychiatric:        Mood and Affect: Mood normal.    Pertinent Microbiology Results for orders placed or performed during the hospital encounter of 04/28/23  Aerobic/Anaerobic Culture w Gram Stain (surgical/deep wound)     Status: None (Preliminary result)   Collection Time: 04/28/23  9:14 AM   Specimen: Wound  Result Value Ref Range Status   Specimen Description WOUND  Final   Special Requests metaphasal  Final   Gram Stain   Final    RARE WBC PRESENT,BOTH PMN AND MONONUCLEAR NO ORGANISMS SEEN Performed at [facility], [address], [place], [place] [address]    Culture PENDING  Incomplete   Report Status PENDING  Incomplete   Aerobic/Anaerobic Culture w Gram Stain (surgical/deep wound)     Status: None (Preliminary result)   Collection Time: 04/28/23  9:18 AM   Specimen: Wound  Result Value Ref Range Status   Specimen Description WOUND  Final   Special Requests METAPHASAL CULTURE 2  Final   Gram Stain   Final    RARE WBC PRESENT, PREDOMINANTLY MONONUCLEAR NO ORGANISMS SEEN Performed at [facility], [address] [place], [place] [address]    Culture PENDING  Incomplete   Report Status PENDING  Incomplete  Aerobic/Anaerobic Culture w Gram Stain (surgical/deep wound)     Status: None (Preliminary result)   Collection Time: 04/28/23  9:19 AM   Specimen: Wound  Result Value Ref Range Status   Specimen Description WOUND  Final   Special Requests inside humoral head canal  Final   Gram Stain   Final    RARE WBC PRESENT, PREDOMINANTLY MONONUCLEAR NO ORGANISMS SEEN Performed at [facility], [address], [place], [place] [address]    Culture PENDING  Incomplete   Report Status PENDING  Incomplete  Aerobic/Anaerobic Culture w Gram Stain (surgical/deep wound)     Status: None (Preliminary result)   Collection Time: 04/28/23  9:20 AM   Specimen: Wound  Result Value Ref Range Status   Specimen Description WOUND  Final   Special Requests  tissue implant  Final   Gram Stain   Final    NO WBC SEEN NO ORGANISMS SEEN Performed at [facility], [address], [place], [place] [address]    Culture PENDING  Incomplete   Report Status PENDING  Incomplete  Aerobic/Anaerobic Culture w Gram Stain (surgical/deep wound)     Status: None (Preliminary result)   Collection Time: 04/28/23  9:21 AM   Specimen: Wound  Result Value Ref Range Status   Specimen Description WOUND  Final   Special Requests Retro glenoid  Final   Gram Stain   Final    NO WBC SEEN NO ORGANISMS SEEN Performed at [facility], [address], [place], [place] [address]    Culture PENDING  Incomplete   Report  Status PENDING  Incomplete   Pertinent Lab seen by me:    Latest Ref Rng & Units 04/21/2023   10:30 AM 01/19/2023    1:54 PM 07/18/2019    1:33 PM  CBC  WBC 4.0 - 10.5 K/uL 6.7  6.9  6.7   Hemoglobin 13.0 - 17.0 g/dL 13.0  86.5  78.4   Hematocrit 39.0 - 52.0 % 46.7  47.3  41.5   Platelets 150 - 400 K/uL 228  227  232.0       Latest Ref Rng & Units 04/21/2023   10:30 AM 09/07/2021    1:02 PM 07/18/2019    1:33 PM  CMP  Glucose 70 - 99 mg/dL 92  696  83   BUN 8 - 23 mg/dL 17  20  19    Creatinine 0.61 - 1.24 mg/dL 2.95  2.84  1.32   Sodium 135 - 145 mmol/L 140  142  143   Potassium 3.5 - 5.1 mmol/L 4.1  4.5  3.7   Chloride 98 - 111 mmol/L 106  106  107   CO2 22 - 32 mmol/L 27  30  29    Calcium 8.9 - 10.3 mg/dL 9.3  9.4  9.1   Total Protein 6.0 - 8.3 g/dL  6.8  6.4   Total Bilirubin 0.2 - 1.2 mg/dL  0.4  0.4   Alkaline Phos 39 - 117 U/L  82  88   AST 0 - 37 U/L  19  16   ALT 0 - 53 U/L  24  20      Pertinent Imagings/Other Imagings Plain films and CT images have been personally visualized and interpreted; radiology reports have been reviewed. Decision making incorporated into the Impression / Recommendations.  DG Shoulder Right Port  Result Date: 04/28/2023 CLINICAL DATA:  Status post right shoulder surgery. EXAM: RIGHT SHOULDER - 1 VIEW COMPARISON:  06/24/2021 and CT dated 02/07/2023 FINDINGS: Interval removal of the right shoulder prosthesis and placement of a rod and reformed humeral head using methylmethacrylate. Extensive erosive changes in the glenoid with progression since 02/07/2023. No fracture or dislocation seen. IMPRESSION: 1. Interval removal of the right shoulder prosthesis and placement of a rod and reformed humeral head using methylmethacrylate. 2. Extensive erosive changes in the glenoid with progression since 02/07/2023. This can be seen with particle disease and infection. Electronically Signed   By: Beckie Salts M.D.   On: 04/28/2023 17:25    I have personally spent  85 minutes involved in face-to-face and non-face-to-face activities for this patient on the day of the visit. Professional time spent includes the following activities: Preparing to see the patient (review of tests), Obtaining and/or reviewing separately obtained history (admission/discharge record), Performing a medically appropriate examination and/or evaluation , Ordering medications/tests/procedures, referring and communicating with other health care professionals, Documenting clinical information in the EMR, Independently interpreting results (not separately reported), Communicating results to the patient/family/caregiver, Counseling and educating the patient/family/caregiver and Care coordination (not separately reported).  Electronically signed by:   Plan d/w requesting provider as well as ID pharm D  Of note, portions of this note may have been created with voice recognition software. While this note has been edited for accuracy, occasional wrong-word or 'sound-a-like' substitutions may have occurred due to the inherent limitations of voice recognition software.   Odette Fraction, MD Infectious Disease Physician [facility] Pager: [phone]

## 2023-04-29 NOTE — Progress Notes (Signed)
IV team consult stating that patient needs a PICC placed for home IV antibiotics.  Bedside nurse had been notified by Shaune Leeks, RN VAST previous to this consult that we need a doctor's order to place PICC.  Secure chat sent to Metro Kung, LPN and Michael Boston, RN restating that without an order PICC cannot be placed.  Ashia responded that she will notify the provider.

## 2023-04-29 NOTE — TOC Transition Note (Signed)
Transition of Care [facility]) - CM/SW Discharge Note   Patient Details  Name: Joseph [name] MRN: [id number] Date of Birth: January 25, 1956  Transition of Care [facility]) CM/SW Contact:  Epifanio Lesches, RN Phone Number: 04/29/2023, 4:13 PM   Clinical Narrative:    Patient will DC to: home Anticipated DC date: 04/29/2023 Family notified: yes Transport by: car    -s/p Right shoulder removal of implants with extensive debridement and placement of antibiotic spacer , 11/14 Per MD patient ready for DC today once PICC LINE placed and home infusion teaching completed with wife and pt. RN, patient, and patient's wife notified of DC. Pt will require LT IV ABX therapy. Pt agreeable to home health services. Pt without provider preference. Referral made with [facility] and accepted. Referral made with [facility] and accepted. Pt without RX med concerns . Post hospital f/u noted on AVS. Wife to provide transportation to home.  RNCM will sign off for now as intervention is no longer needed. Please consult [place] again if new needs arise.   Final next level of care: Home w Home Health Services Barriers to Discharge: Other (must enter comment) (PICC LINE  AND TEACHING PENDING)   Patient Goals and CMS Choice   Choice offered to / list presented to : Patient  Discharge Placement                         Discharge Plan and Services Additional resources added to the After Visit Summary for                  DME Arranged: Other see comment (IV ABX THERAPY) DME Agency: Other - Comment ([facility]) Date DME Agency Contacted: 04/29/23 Time DME Agency Contacted: [phone] Representative spoke with at DME Agency: [facility] HH Arranged: RN HH Agency: Other - See comment, [facility] Date HH Agency Contacted: 04/29/23 Time HH Agency Contacted: 1609 Representative spoke with at [facility]: Morrie Sheldon  Social Determinants of Health (SDOH) Interventions SDOH  Screenings   Food Insecurity: No Food Insecurity (04/28/2023)  Housing: Low Risk  (04/28/2023)  Transportation Needs: No Transportation Needs (04/28/2023)  Utilities: Not At Risk (04/28/2023)  Depression (PHQ2-9): Low Risk  (05/21/2022)  Tobacco Use: Medium Risk (04/28/2023)     Readmission Risk Interventions     No data to display

## 2023-04-29 NOTE — Progress Notes (Addendum)
PHARMACY CONSULT NOTE FOR:  OUTPATIENT  PARENTERAL ANTIBIOTIC THERAPY (OPAT)  Indication: Prosthetic Shoulder Infection Regimen: Daptomycin 700 mg IV every 24 hours, Ceftriaxone 2 gm IV every 24 hours End date: 06/09/23  IV antibiotic discharge orders are pended. To discharging provider:  please sign these orders via discharge navigator,  Select New Orders & click on the button choice - Manage This Unsigned Work.    Thank you for allowing pharmacy to be a part of this patient's care.  Enos Fling, PharmD PGY-1 Acute Care Pharmacy Resident 04/29/2023 10:26 AM

## 2023-04-29 NOTE — Progress Notes (Addendum)
1800: This RN to pt bedside to assess PIV due to consult placed. No issue with current PIV. Flex RN at bedside and asked this RN to read progress notes form provider regarding d/c home today after PICC line placement. No PICC order found at this time or in order history. Educated Government social research officer that an order will have to be placed in order for the PICC RN's to come assess pt and then attempt to place PICC. Flex RN stated she has spoken to primary RN and that they will work on getting an order from provider.   1915: Additional consult placed for IV team to place PICC. Messaged primary RN & LPN that PICC cannot be placed without a provider's order and educated that once order is placed, PICC RN's will be able to assess pt for PICC appropriateness and attempt placement.

## 2023-04-29 NOTE — Progress Notes (Signed)
Peripherally Inserted Central Catheter Placement  The IV Nurse has discussed with the patient and/or persons authorized to consent for the patient, the purpose of this procedure and the potential benefits and risks involved with this procedure.  The benefits include less needle sticks, lab draws from the catheter, and the patient may be discharged home with the catheter. Risks include, but not limited to, infection, bleeding, blood clot (thrombus formation), and puncture of an artery; nerve damage and irregular heartbeat and possibility to perform a PICC exchange if needed/ordered by physician.  Alternatives to this procedure were also discussed.  Bard Power PICC patient education guide, fact sheet on infection prevention and patient information card has been provided to patient /or left at bedside.    PICC Placement Documentation  PICC Single Lumen 04/29/23 Left Cephalic 49 cm 0 cm (Active)  Indication for Insertion or Continuance of Line Home intravenous therapies (PICC only) 04/29/23 2159  Exposed Catheter (cm) 0 cm 04/29/23 2159  Site Assessment Clean, Dry, Intact 04/29/23 2159  Line Status Blood return noted;Flushed;Saline locked 04/29/23 2159  Dressing Type Transparent;Securing device 04/29/23 2159  Dressing Status Antimicrobial disc in place;Clean, Dry, Intact 04/29/23 2159  Line Care Connections checked and tightened 04/29/23 2159  Line Adjustment (NICU/IV Team Only) No 04/29/23 2159  Dressing Intervention New dressing;Adhesive placed at insertion site (IV team only) 04/29/23 2159  Dressing Change Due 05/06/23 04/29/23 2159       Christeen Douglas 04/29/2023, 10:21 PM

## 2023-04-29 NOTE — Progress Notes (Signed)
CT scan obtained of the right shoulder for preoperative planning purposes.  Patient is okay for discharge after receiving PICC line.  Discharge orders are placed.

## 2023-04-29 NOTE — Anesthesia Postprocedure Evaluation (Signed)
Anesthesia Post Note  Patient: Joseph [name]  Procedure(s) Performed: REMOVAL RIGHT SHOULDER IMPLANTS (Right: Shoulder)     Patient location during evaluation: PACU Anesthesia Type: General and Regional Level of consciousness: awake and alert Pain management: pain level controlled Vital Signs Assessment: post-procedure vital signs reviewed and stable Respiratory status: spontaneous breathing, nonlabored ventilation, respiratory function stable and patient connected to nasal cannula oxygen Cardiovascular status: blood pressure returned to baseline and stable Postop Assessment: no apparent nausea or vomiting Anesthetic complications: no   No notable events documented.  Last Vitals:  Vitals:   04/29/23 0421 04/29/23 0854  BP: 100/64 113/68  Pulse: 68 73  Resp: 18 17  Temp: 36.6 C 36.7 C  SpO2: 96% 100%    Last Pain:  Vitals:   04/29/23 0854  TempSrc: Oral  PainSc:                  Nelle Don Sheron Tallman

## 2023-04-29 NOTE — Plan of Care (Signed)
  Problem: Education: Goal: Knowledge of General Education information will improve Description: Including pain rating scale, medication(s)/side effects and non-pharmacologic comfort measures Outcome: Progressing   Problem: Clinical Measurements: Goal: Cardiovascular complication will be avoided Outcome: Progressing   Problem: Nutrition: Goal: Adequate nutrition will be maintained Outcome: Progressing   Problem: Safety: Goal: Ability to remain free from injury will improve Outcome: Progressing   

## 2023-04-30 ENCOUNTER — Other Ambulatory Visit (HOSPITAL_COMMUNITY): Payer: Self-pay

## 2023-04-30 MED ORDER — CELECOXIB 100 MG PO CAPS
100.0000 mg | ORAL_CAPSULE | Freq: Two times a day (BID) | ORAL | 0 refills | Status: DC
  Filled 2023-04-30: qty 60, 30d supply, fill #0

## 2023-04-30 MED ORDER — HYDROMORPHONE HCL 2 MG PO TABS
1.0000 mg | ORAL_TABLET | ORAL | 0 refills | Status: DC | PRN
  Filled 2023-04-30: qty 30, 5d supply, fill #0

## 2023-04-30 MED ORDER — HEPARIN SOD (PORK) LOCK FLUSH 100 UNIT/ML IV SOLN
250.0000 [IU] | INTRAVENOUS | Status: AC | PRN
  Administered 2023-04-30: 250 [IU]

## 2023-04-30 NOTE — Progress Notes (Signed)
Patient is POD 2 s/p right shoulder removal of arthroplasty implants and placement of cement spacer for possible periprosthetic joint infection.  He is doing well.  States pain is controlled until about the last hour of the effectiveness of his pain medicine.  No fevers or chills.  No chest pain or shortness of breath.  Dressing is clean dry and intact.  Intact EPL, FPL, finger abduction.  Axillary nerve intact with deltoid firing.  He did receive CT scan of the right shoulder yesterday.  He also got his PICC line late last night.  Should be good for discharge home today but we will make sure he gets his doses of IV antibiotics today prior to discharge and then should have home health nurse come to assist him with his antibiotic dosing tomorrow.  Discharge orders in and patient is okay to discharge after receiving antibiotics.

## 2023-04-30 NOTE — TOC Transition Note (Signed)
Transition of Care [facility]) - CM/SW Discharge Note   Patient Details  Name: Joseph [name] MRN: [id number] Date of Birth: [birth year]  Transition of Care [facility]) CM/SW Contact:  Ronny Bacon, RN Phone Number: 04/30/2023, 9:28 AM   Clinical Narrative:  Patient is being discharged today. Floor nurse made aware that patient will need IV antibiotic doses prior to discharge with start of care for home IV antibiotics to be begin tomorrow. Pam with [facility] made aware. Heather with [facility] made aware.      Final next level of care: Home w Home Health Services Barriers to Discharge: No Barriers Identified   Patient Goals and CMS Choice   Choice offered to / list presented to : Patient  Discharge Placement                         Discharge Plan and Services Additional resources added to the After Visit Summary for                  DME Arranged: Other see comment ([facility]) DME Agency: Other - Comment ([facility]) Date DME Agency Contacted: 04/29/23 Time DME Agency Contacted: [phone] Representative spoke with at DME Agency: [facility] HH Arranged: RN HH Agency: Other - See comment, [facility] ([facility]) Date HH Agency Contacted: 04/29/23 Time HH Agency Contacted: [id number] Representative spoke with at [facility] Agency: Morrie Sheldon  Social Determinants of Health (SDOH) Interventions SDOH Screenings   Food Insecurity: No Food Insecurity (04/28/2023)  Housing: Low Risk  (04/28/2023)  Transportation Needs: No Transportation Needs (04/28/2023)  Utilities: Not At Risk (04/28/2023)  Depression (PHQ2-9): Low Risk  (05/21/2022)  Tobacco Use: Medium Risk (04/28/2023)     Readmission Risk Interventions     No data to display

## 2023-04-30 NOTE — Progress Notes (Signed)
Occupational Therapy Treatment Patient Details Name: Joseph [name] MRN: [id number] DOB: 02/18/1956 Today's Date: 04/30/2023   History of present illness Pt is a 67 y/o male presenting on 11/14 with R shoulder pain. 11/14 s/p R shoulder removal of implants with extensive debridement and placement of antibiotic spacer. PMH includes: arthritis, HTN, PNA, rheumatic fever, vertigo, cervical surgery 1995, R shoulder replacement 2013.   OT comments  Pt. Seen for skilled OT treatment session. Pt. Able to demonstrate don/doff of sling with good tech.  Good demo of hand/wrist/elbow ROM.  Tolerated well. Agree with d/c recommendations.        If plan is discharge home, recommend the following:  A little help with walking and/or transfers;A lot of help with bathing/dressing/bathroom;Assistance with cooking/housework;Assist for transportation;Help with stairs or ramp for entrance   Equipment Recommendations  None recommended by OT    Recommendations for Other Services      Precautions / Restrictions Precautions Precautions: Fall;Shoulder Type of Shoulder Precautions: No shoulder ROM, okay for hand/wrist/elbow ROM Shoulder Interventions: Shoulder sling/immobilizer;Off for dressing/bathing/exercises;At all times Required Braces or Orthoses: Sling Restrictions Weight Bearing Restrictions: Yes RUE Weight Bearing: Non weight bearing       Mobility Bed Mobility                    Transfers                         Balance                                           ADL either performed or assessed with clinical judgement   ADL Overall ADL's : Needs assistance/impaired                 Upper Body Dressing : Set up;Sitting Upper Body Dressing Details (indicate cue type and reason): able to don/doff sling with out assistance, reviewed importance of knowing the steps so he could guide anyone to assist him if needed.  also showed how to place washcloth  ot offset any neck irritation from sling strap                        Extremity/Trunk Assessment              Vision       Perception     Praxis      Cognition Arousal: Alert Behavior During Therapy: WFL for tasks assessed/performed Overall Cognitive Status: Within Functional Limits for tasks assessed                                          Exercises General Exercises - Upper Extremity Elbow Flexion: AROM, Right, 10 reps, Seated (educated on proper positioning by side to prevent any strain on shoulder, also to stop at point of pain) Elbow Extension: AROM, Right, 10 reps, Seated Wrist Flexion: AROM, Right, 10 reps, Seated Wrist Extension: AROM, Right, 10 reps, Seated Digit Composite Flexion: AROM, Right, 10 reps, Seated Composite Extension: AROM, Right, 10 reps, Seated    Shoulder Instructions  Reviewed only to have sling off for exercises when awake and alert. If sleepy or planning to rest, sling needs to be on. Pt. Verbalized understanding.  General Comments      Pertinent Vitals/ Pain       Pain Assessment Pain Assessment: No/denies pain Pain Intervention(s): Premedicated before session  Home Living                                          Prior Functioning/Environment              Frequency  Min 1X/week        Progress Toward Goals  OT Goals(current goals can now be found in the care plan section)  Progress towards OT goals: Progressing toward goals     Plan      Co-evaluation                 AM-PAC OT "6 Clicks" Daily Activity     Outcome Measure   Help from another person eating meals?: A Little Help from another person taking care of personal grooming?: A Little Help from another person toileting, which includes using toliet, bedpan, or urinal?: A Little Help from another person bathing (including washing, rinsing, drying)?: A Lot Help from another person to put on and taking  off regular upper body clothing?: A Lot Help from another person to put on and taking off regular lower body clothing?: A Little 6 Click Score: 16    End of Session    OT Visit Diagnosis: Pain Pain - Right/Left: Right Pain - part of body: Shoulder   Activity Tolerance Patient tolerated treatment well   Patient Left in bed;with call bell/phone within reach   Nurse Communication Other (comment) (spoke with CNA pt. requesting ice for the ice machine for his shoulder)        Time: 1610-9604 OT Time Calculation (min): 10 min  Charges: OT General Charges $OT Visit: 1 Visit OT Treatments $Therapeutic Exercise: 8-22 mins  Boneta Lucks, COTA/L Acute Rehabilitation [phone]   Alessandra Bevels Lorraine-COTA/L 04/30/2023, 1:27 PM

## 2023-05-01 DIAGNOSIS — Z452 Encounter for adjustment and management of vascular access device: Secondary | ICD-10-CM | POA: Diagnosis not present

## 2023-05-01 DIAGNOSIS — Z792 Long term (current) use of antibiotics: Secondary | ICD-10-CM | POA: Diagnosis not present

## 2023-05-01 DIAGNOSIS — T8484XD Pain due to internal orthopedic prosthetic devices, implants and grafts, subsequent encounter: Secondary | ICD-10-CM | POA: Diagnosis not present

## 2023-05-01 DIAGNOSIS — T84038D Mechanical loosening of other internal prosthetic joint, subsequent encounter: Secondary | ICD-10-CM | POA: Diagnosis not present

## 2023-05-01 DIAGNOSIS — Z556 Problems related to health literacy: Secondary | ICD-10-CM | POA: Diagnosis not present

## 2023-05-01 DIAGNOSIS — K219 Gastro-esophageal reflux disease without esophagitis: Secondary | ICD-10-CM | POA: Diagnosis not present

## 2023-05-01 DIAGNOSIS — Z791 Long term (current) use of non-steroidal anti-inflammatories (NSAID): Secondary | ICD-10-CM | POA: Diagnosis not present

## 2023-05-01 DIAGNOSIS — R7303 Prediabetes: Secondary | ICD-10-CM | POA: Diagnosis not present

## 2023-05-01 DIAGNOSIS — M19011 Primary osteoarthritis, right shoulder: Secondary | ICD-10-CM | POA: Diagnosis not present

## 2023-05-01 DIAGNOSIS — Z7982 Long term (current) use of aspirin: Secondary | ICD-10-CM | POA: Diagnosis not present

## 2023-05-01 DIAGNOSIS — E782 Mixed hyperlipidemia: Secondary | ICD-10-CM | POA: Diagnosis not present

## 2023-05-01 DIAGNOSIS — Z87891 Personal history of nicotine dependence: Secondary | ICD-10-CM | POA: Diagnosis not present

## 2023-05-01 DIAGNOSIS — I10 Essential (primary) hypertension: Secondary | ICD-10-CM | POA: Diagnosis not present

## 2023-05-02 ENCOUNTER — Encounter (HOSPITAL_COMMUNITY): Payer: Self-pay | Admitting: Orthopedic Surgery

## 2023-05-02 ENCOUNTER — Telehealth: Payer: Self-pay

## 2023-05-02 NOTE — Telephone Encounter (Signed)
IC s/w inpatient lab and asked them to continue culture per your request. '

## 2023-05-02 NOTE — Telephone Encounter (Signed)
-----   Message from Burnard Bunting sent at 05/02/2023 10:47 AM EST ----- Can you cll lab and have them keep cxs going for 21 d thx

## 2023-05-02 NOTE — Progress Notes (Signed)
Can you cll lab and have them keep cxs going for 21 d thx

## 2023-05-03 NOTE — Telephone Encounter (Signed)
tyvm

## 2023-05-04 LAB — SURGICAL PATHOLOGY

## 2023-05-04 NOTE — Discharge Summary (Signed)
Physician Discharge Summary      Patient ID: Joseph [name] MRN: [id number] DOB/AGE: [birth year] 67 y.o.  Admit date: 04/28/2023 Discharge date: 04/30/2023  Admission Diagnoses:  Principal Problem:   OA (osteoarthritis) of shoulder Active Problems:   S/P hardware removal   Prosthetic joint infection [facility])   Discharge Diagnoses:  Same  Surgeries: Procedure(s): REMOVAL RIGHT SHOULDER IMPLANTS AND PLACEMENT OF ANTIBIOTIC SPACER on 04/28/2023   Consultants:   Discharged Condition: Stable  Hospital Course: Joseph [name] is an 67 y.o. male who was admitted 04/28/2023 with a chief complaint of right shoulder pain, and found to have a diagnosis of loose right shoulder prosthesis.  They were brought to the operating room on 04/28/2023 and underwent the above named procedures.  Pt awoke from anesthesia without complication and was transferred to the floor. On POD1, patient's pain was controlled.  He had CT scan of the right shoulder for preoperative planning purposes for his upcoming second stage procedure.  He also had ID consult with recommendation for IV antibiotics through PICC line.  PICC line was placed late around 11 PM and so he was discharged the following day on 11/16.  No red flag signs or symptoms throughout his stay..  Pt will f/u with Dr. August Saucer in clinic in ~2 weeks.   Antibiotics given:  Anti-infectives (From admission, onward)    Start     Dose/Rate Route Frequency Ordered Stop   04/29/23 0000  daptomycin (CUBICIN) IVPB        700 mg Intravenous Every 24 hours 04/29/23 1520 06/09/23 2359   04/29/23 0000  cefTRIAXone (ROCEPHIN) IVPB        2 g Intravenous Every 24 hours 04/29/23 1520 06/09/23 2359   04/28/23 1700  cefTRIAXone (ROCEPHIN) 2 g in sodium chloride 0.9 % 100 mL IVPB  Status:  Discontinued        2 g 200 mL/hr over 30 Minutes Intravenous Every 24 hours 04/28/23 1536 04/30/23 2101   04/28/23 1630  DAPTOmycin (CUBICIN) IVPB 700 mg/139mL premix  Status:   Discontinued        700 mg 200 mL/hr over 30 Minutes Intravenous Daily 04/28/23 1536 04/30/23 2101   04/28/23 1600  ceFAZolin (ANCEF) IVPB 2g/100 mL premix  Status:  Discontinued        2 g 200 mL/hr over 30 Minutes Intravenous Every 8 hours 04/28/23 1427 04/28/23 1536   04/28/23 1105  vancomycin (VANCOCIN) powder  Status:  Discontinued          As needed 04/28/23 1106 04/28/23 1213   04/28/23 0600  ceFAZolin (ANCEF) IVPB 2g/100 mL premix        2 g 200 mL/hr over 30 Minutes Intravenous On call to O.R. 04/28/23 1607 04/28/23 0820     .  Recent vital signs:  Vitals:   04/30/23 0439 04/30/23 0724  BP: (!) 145/65 (!) 161/65  Pulse: 69 72  Resp: 20 17  Temp: 98.1 F (36.7 C) 98.5 F (36.9 C)  SpO2: 94% 93%    Recent laboratory studies:  Results for orders placed or performed during the hospital encounter of 04/28/23  Aerobic/Anaerobic Culture w Gram Stain (surgical/deep wound)   Specimen: Wound  Result Value Ref Range   Specimen Description WOUND    Special Requests metaphasal    Gram Stain      RARE WBC PRESENT,BOTH PMN AND MONONUCLEAR NO ORGANISMS SEEN    Culture      NO GROWTH 6 DAYS CONTINUING TO HOLD Performed at [facility]  Hospital Lab, [address], [place], [place] [address]    Report Status PENDING   Aerobic/Anaerobic Culture w Gram Stain (surgical/deep wound)   Specimen: Wound  Result Value Ref Range   Specimen Description WOUND    Special Requests METAPHASAL CULTURE 2    Gram Stain      RARE WBC PRESENT, PREDOMINANTLY MONONUCLEAR NO ORGANISMS SEEN    Culture      NO GROWTH 6 DAYS CONTINUING TO HOLD Performed at [facility], [address], [place], [place] [address]    Report Status PENDING   Aerobic/Anaerobic Culture w Gram Stain (surgical/deep wound)   Specimen: Wound  Result Value Ref Range   Specimen Description WOUND    Special Requests inside humoral head canal    Gram Stain      RARE WBC PRESENT, PREDOMINANTLY MONONUCLEAR NO ORGANISMS  SEEN    Culture      NO GROWTH 6 DAYS CONTINUING TO HOLD Performed at [facility], [address] [place], [place] [address]    Report Status PENDING   Aerobic/Anaerobic Culture w Gram Stain (surgical/deep wound)   Specimen: Wound  Result Value Ref Range   Specimen Description WOUND    Special Requests  tissue implant    Gram Stain NO WBC SEEN NO ORGANISMS SEEN     Culture      NO GROWTH 6 DAYS CONTINUING TO HOLD Performed at [facility], [address] [place], [place] [address]    Report Status PENDING   Aerobic/Anaerobic Culture w Gram Stain (surgical/deep wound)   Specimen: Wound  Result Value Ref Range   Specimen Description WOUND    Special Requests Retro glenoid    Gram Stain NO WBC SEEN NO ORGANISMS SEEN     Culture      NO GROWTH 6 DAYS CONTINUING TO HOLD Performed at [facility], [address] [place], [place] [address]    Report Status PENDING   Urinalysis, w/ Reflex to Culture (Infection Suspected) -Urine, Clean Catch  Result Value Ref Range   Specimen Source URINE, CLEAN CATCH    Color, Urine STRAW (A) YELLOW   APPearance CLEAR CLEAR   Specific Gravity, Urine 1.003 (L) 1.005 - 1.030   pH 6.0 5.0 - 8.0   Glucose, UA NEGATIVE NEGATIVE mg/dL   Hgb urine dipstick NEGATIVE NEGATIVE   Bilirubin Urine NEGATIVE NEGATIVE   Ketones, ur NEGATIVE NEGATIVE mg/dL   Protein, ur NEGATIVE NEGATIVE mg/dL   Nitrite NEGATIVE NEGATIVE   Leukocytes,Ua NEGATIVE NEGATIVE   RBC / HPF 0-5 0 - 5 RBC/hpf   WBC, UA 0-5 0 - 5 WBC/hpf   Bacteria, UA NONE SEEN NONE SEEN   Squamous Epithelial / HPF 0-5 0 - 5 /HPF  CK  Result Value Ref Range   Total CK 229 49 - 397 U/L  CBC with Differential/Platelet  Result Value Ref Range   WBC 18.5 (H) 4.0 - 10.5 K/uL   RBC 3.96 (L) 4.22 - 5.81 MIL/uL   Hemoglobin 12.0 (L) 13.0 - 17.0 g/dL   HCT 25.3 (L) 66.4 - 40.3 %   MCV 90.7 80.0 - 100.0 fL   MCH 30.3 26.0 - 34.0 pg   MCHC 33.4 30.0 - 36.0 g/dL   RDW 47.4 25.9  - 56.3 %   Platelets 200 150 - 400 K/uL   nRBC 0.0 0.0 - 0.2 %   Neutrophils Relative % 86 %   Neutro Abs 15.9 (H) 1.7 -  7.7 K/uL   Lymphocytes Relative 7 %   Lymphs Abs 1.2 0.7 - 4.0 K/uL   Monocytes Relative 7 %   Monocytes Absolute 1.4 (H) 0.1 - 1.0 K/uL   Eosinophils Relative 0 %   Eosinophils Absolute 0.0 0.0 - 0.5 K/uL   Basophils Relative 0 %   Basophils Absolute 0.0 0.0 - 0.1 K/uL   Immature Granulocytes 0 %   Abs Immature Granulocytes 0.04 0.00 - 0.07 K/uL  Comprehensive metabolic panel  Result Value Ref Range   Sodium 136 135 - 145 mmol/L   Potassium 4.3 3.5 - 5.1 mmol/L   Chloride 104 98 - 111 mmol/L   CO2 24 22 - 32 mmol/L   Glucose, Bld 122 (H) 70 - 99 mg/dL   BUN 28 (H) 8 - 23 mg/dL   Creatinine, Ser 4.09 (H) 0.61 - 1.24 mg/dL   Calcium 8.8 (L) 8.9 - 10.3 mg/dL   Total Protein 5.6 (L) 6.5 - 8.1 g/dL   Albumin 3.4 (L) 3.5 - 5.0 g/dL   AST 17 15 - 41 U/L   ALT 17 0 - 44 U/L   Alkaline Phosphatase 64 38 - 126 U/L   Total Bilirubin 0.2 <1.2 mg/dL   GFR, Estimated 39 (L) >60 mL/min   Anion gap 8 5 - 15  Sedimentation rate  Result Value Ref Range   Sed Rate 4 0 - 16 mm/hr  C-reactive protein  Result Value Ref Range   CRP 1.2 (H) <1.0 mg/dL  ABO/Rh  Result Value Ref Range   ABO/RH(D)      A POS Performed at [facility], [address], [place], [place] [address]     Discharge Medications:   Allergies as of 04/30/2023       Reactions   Hydrocodone-acetaminophen Nausea Only, Nausea And Vomiting   Other    Intolerance to narcotic medication - causes stomach upset and dizziness and does not relieve pain.         Medication List     STOP taking these medications    meloxicam 7.5 MG tablet Commonly known as: MOBIC   tiZANidine 4 MG tablet Commonly known as: ZANAFLEX   traMADol 50 MG tablet Commonly known as: ULTRAM       TAKE these medications    amLODipine 5 MG tablet Commonly known as: NORVASC Take 1 tablet (5 mg total) by mouth  daily.   atorvastatin 40 MG tablet Commonly known as: LIPITOR Take 1 tablet by mouth once daily What changed: when to take this   cefTRIAXone IVPB Commonly known as: ROCEPHIN Inject 2 g into the vein daily. Indication:  Prosthetic Shoulder Infection First Dose: Yes Last Day of Therapy:  06/09/23 Labs - Once weekly:  CBC/D and BMP, Labs - Once weekly: ESR and CRP Method of administration: IV Push Method of administration may be changed at the discretion of home infusion pharmacist based upon assessment of the patient and/or caregiver's ability to self-administer the medication ordered.   celecoxib 100 MG capsule Commonly known as: CELEBREX Take 1 capsule (100 mg total) by mouth 2 (two) times daily.   daptomycin IVPB Commonly known as: CUBICIN Inject 700 mg into the vein daily. Indication:  Prosthetic Shoulder Infection First Dose: Yes Last Day of Therapy:  06/09/23 Labs - Once weekly:  CBC/D, BMP, and CPK Labs - Once weekly: ESR and CRP Method of administration: IV Push Method of administration may be changed at the discretion of home infusion pharmacist based upon  assessment of the patient and/or caregiver's ability to self-administer the medication ordered.   Diclofenac Sodium 1.5 % Soln APPLY TOPICALLY 3 TIMES DAILY AS NEEDED FOR PAIN   docusate sodium 100 MG capsule Commonly known as: COLACE Take 1 capsule (100 mg total) by mouth 2 (two) times daily.   HYDROmorphone 2 MG tablet Commonly known as: DILAUDID Take 0.5-1 tablets (1-2 mg total) by mouth every 4 (four) hours as needed for moderate pain (pain score 4-6) (pains score 4-6).   lidocaine 5 % Commonly known as: LIDODERM Place 1 patch onto the skin daily. Remove & Discard patch within 12 hours or as directed by MD What changed:  when to take this reasons to take this   methocarbamol 500 MG tablet Commonly known as: ROBAXIN Take 1 tablet (500 mg total) by mouth every 8 (eight) hours as needed for muscle  spasms.   pantoprazole 40 MG tablet Commonly known as: PROTONIX Take 1 tablet by mouth twice daily               Discharge Care Instructions  (From admission, onward)           Start     Ordered   04/29/23 0000  Change dressing on IV access line weekly and PRN  (Home infusion instructions - [facility] )        04/29/23 1520            Diagnostic Studies: [place] EKG SITE RITE  Result Date: 04/29/2023 If Site Rite image not attached, placement could not be confirmed due to current cardiac rhythm.  CT SHOULDER RIGHT WO CONTRAST  Result Date: 04/29/2023 CLINICAL DATA:  Right shoulder pain. Removal of right shoulder arthroplasty 1 day ago EXAM: CT OF THE UPPER RIGHT EXTREMITY WITHOUT CONTRAST TECHNIQUE: Multidetector CT imaging of the upper right extremity was performed according to the standard protocol. RADIATION DOSE REDUCTION: This exam was performed according to the departmental dose-optimization program which includes automated exposure control, adjustment of the mA and/or kV according to patient size and/or use of iterative reconstruction technique. COMPARISON:  CT 02/07/2023, x-ray 04/28/2023 FINDINGS: Bones/Joint/Cartilage Interval removal of right shoulder arthroplasty with placement of a cement spacer in the proximal humerus. Prominent cystic/resorptive changes of the glenoid with progression since the 02/07/2023 study. No fracture or dislocation. No lytic or sclerotic bone lesion. Ligaments Suboptimally assessed by CT. Muscles and Tendons Preserved bulk of the rotator cuff musculature. Soft tissues Soft tissue swelling with a small amount of fluid around the shoulder, presumably postoperative. There is air within the soft tissues about the shoulder, also compatible with recent surgery. No organized fluid collections. No right axillary lymphadenopathy. The included right lung field is clear. No pneumothorax. IMPRESSION: 1. Interval removal of right shoulder  arthroplasty with placement of a cement spacer in the proximal humerus. 2. Prominent cystic/resorptive changes of the glenoid with progression since the 02/07/2023 study. 3. Soft tissue swelling with a small amount of fluid around the shoulder, presumably postoperative. No organized fluid collections. Electronically Signed   By: Duanne Guess D.O.   On: 04/29/2023 18:49   DG Shoulder Right Port  Result Date: 04/28/2023 CLINICAL DATA:  Status post right shoulder surgery. EXAM: RIGHT SHOULDER - 1 VIEW COMPARISON:  06/24/2021 and CT dated 02/07/2023 FINDINGS: Interval removal of the right shoulder prosthesis and placement of a rod and reformed humeral head using methylmethacrylate. Extensive erosive changes in the glenoid with progression since 02/07/2023. No fracture or dislocation seen. IMPRESSION: 1. Interval removal  of the right shoulder prosthesis and placement of a rod and reformed humeral head using methylmethacrylate. 2. Extensive erosive changes in the glenoid with progression since 02/07/2023. This can be seen with particle disease and infection. Electronically Signed   By: Beckie Salts M.D.   On: 04/28/2023 17:25    Disposition: Discharge disposition: 01-Home or Self Care       Discharge Instructions     [facility] pharmacist to adjust dose for Vancomycin, Aminoglycosides and other anti-infective therapies as requested by physician.   Complete by: As directed    [facility] to provide Cath Flo 2mg    Complete by: As directed    Administer for PICC line occlusion and as ordered by physician for other access device issues.   Anaphylaxis Kit: Provided to treat any anaphylactic reaction to the medication being provided to the patient if First Dose or when requested by physician   Complete by: As directed    Epinephrine 1mg /ml vial / amp: Administer 0.3mg  (0.8ml) subcutaneously once for moderate to severe anaphylaxis, nurse to call physician and pharmacy when  reaction occurs and call 911 if needed for immediate care   Diphenhydramine 50mg /ml IV vial: Administer 25-50mg  IV/IM PRN for first dose reaction, rash, itching, mild reaction, nurse to call physician and pharmacy when reaction occurs   Sodium Chloride 0.9% NS IV: Administer if needed for hypovolemic blood pressure drop or as ordered by physician after call to physician with anaphylactic reaction   Call MD / Call 911   Complete by: As directed    If you experience chest pain or shortness of breath, CALL 911 and be transported to the hospital emergency room.  If you develope a fever above 101 F, pus (white drainage) or increased drainage or redness at the wound, or calf pain, call your surgeon's office.   Change dressing on IV access line weekly and PRN   Complete by: As directed    Constipation Prevention   Complete by: As directed    Drink plenty of fluids.  Prune juice may be helpful.  You may use a stool softener, such as Colace (over the counter) 100 mg twice a day.  Use MiraLax (over the counter) for constipation as needed.   Diet - low sodium heart healthy   Complete by: As directed    Discharge instructions   Complete by: As directed    You may shower, dressing is waterproof.  Do not bathe or soak the operative shoulder in a tub, pool.  No lifting with the operative shoulder. No ROM of the operative shoulder. Continue use of the sling at all times.  Follow-up with Dr. August Saucer in ~2 weeks on your given appointment date.  We will remove your adhesive bandage at that time.    Dental Antibiotics:  In most cases prophylactic antibiotics for Dental procdeures after total joint surgery are not necessary.  Exceptions are as follows:  1. History of prior total joint infection  2. Severely immunocompromised (Organ Transplant, cancer chemotherapy, Rheumatoid biologic meds such as Humera)  3. Poorly controlled diabetes (A1C &gt; 8.0, blood glucose over 200)  If you have one of these  conditions, contact your surgeon for an antibiotic prescription, prior to your dental procedure.   Flush IV access with Sodium Chloride 0.9% and Heparin 10 units/ml or 100 units/ml   Complete by: As directed    Home infusion instructions - [facility]   Complete by: As directed    Instructions: Flush  IV access with Sodium Chloride 0.9% and Heparin 10units/ml or 100units/ml   Change dressing on IV access line: Weekly and PRN   Instructions Cath Flo 2mg : Administer for PICC Line occlusion and as ordered by physician for other access device   [facility] pharmacist to adjust dose for: Vancomycin, Aminoglycosides and other anti-infective therapies as requested by physician   Increase activity slowly as tolerated   Complete by: As directed    Method of administration may be changed at the discretion of home infusion pharmacist based upon assessment of the patient and/or caregiver's ability to self-administer the medication ordered   Complete by: As directed    Post-operative opioid taper instructions:   Complete by: As directed    POST-OPERATIVE OPIOID TAPER INSTRUCTIONS: It is important to wean off of your opioid medication as soon as possible. If you do not need pain medication after your surgery it is ok to stop day one. Opioids include: Codeine, Hydrocodone(Norco, Vicodin), Oxycodone(Percocet, oxycontin) and hydromorphone amongst others.  Long term and even short term use of opiods can cause: Increased pain response Dependence Constipation Depression Respiratory depression And more.  Withdrawal symptoms can include Flu like symptoms Nausea, vomiting And more Techniques to manage these symptoms Hydrate well Eat regular healthy meals Stay active Use relaxation techniques(deep breathing, meditating, yoga) Do Not substitute Alcohol to help with tapering If you have been on opioids for less than two weeks and do not have pain than it is ok to stop all together.   Plan to wean off of opioids This plan should start within one week post op of your joint replacement. Maintain the same interval or time between taking each dose and first decrease the dose.  Cut the total daily intake of opioids by one tablet each day Next start to increase the time between doses. The last dose that should be eliminated is the evening dose.             SignedKarenann Cai 05/04/2023, 9:53 AM

## 2023-05-06 ENCOUNTER — Other Ambulatory Visit: Payer: Self-pay | Admitting: Radiology

## 2023-05-06 DIAGNOSIS — M19019 Primary osteoarthritis, unspecified shoulder: Secondary | ICD-10-CM

## 2023-05-06 NOTE — Progress Notes (Signed)
Hi betsy.  The CT scan they did in the hospital does not look correct in terms of the millimeter cuts.  Can you get him a properly formatted CT scan so we can shift that off and get him a date for surgery.  Thanks

## 2023-05-10 DIAGNOSIS — R7303 Prediabetes: Secondary | ICD-10-CM | POA: Diagnosis not present

## 2023-05-10 DIAGNOSIS — T8484XD Pain due to internal orthopedic prosthetic devices, implants and grafts, subsequent encounter: Secondary | ICD-10-CM | POA: Diagnosis not present

## 2023-05-10 DIAGNOSIS — E782 Mixed hyperlipidemia: Secondary | ICD-10-CM | POA: Diagnosis not present

## 2023-05-10 DIAGNOSIS — K219 Gastro-esophageal reflux disease without esophagitis: Secondary | ICD-10-CM | POA: Diagnosis not present

## 2023-05-10 DIAGNOSIS — I10 Essential (primary) hypertension: Secondary | ICD-10-CM | POA: Diagnosis not present

## 2023-05-10 DIAGNOSIS — T84038D Mechanical loosening of other internal prosthetic joint, subsequent encounter: Secondary | ICD-10-CM | POA: Diagnosis not present

## 2023-05-11 ENCOUNTER — Other Ambulatory Visit: Payer: Self-pay | Admitting: Family Medicine

## 2023-05-11 ENCOUNTER — Encounter: Payer: Self-pay | Admitting: Orthopedic Surgery

## 2023-05-11 ENCOUNTER — Ambulatory Visit: Payer: Medicare Other | Admitting: Orthopedic Surgery

## 2023-05-11 ENCOUNTER — Ambulatory Visit (HOSPITAL_COMMUNITY)
Admission: RE | Admit: 2023-05-11 | Discharge: 2023-05-11 | Disposition: A | Discharge status: Home / Self Care | Payer: Medicare Other | Source: Ambulatory Visit | Attending: Orthopedic Surgery | Admitting: Orthopedic Surgery

## 2023-05-11 DIAGNOSIS — M19019 Primary osteoarthritis, unspecified shoulder: Secondary | ICD-10-CM | POA: Insufficient documentation

## 2023-05-11 DIAGNOSIS — Z01818 Encounter for other preprocedural examination: Secondary | ICD-10-CM | POA: Diagnosis not present

## 2023-05-11 MED ORDER — METHOCARBAMOL 500 MG PO TABS: 500.0000 mg | ORAL_TABLET | Freq: Three times a day (TID) | ORAL | 0 refills | Status: DC | PRN

## 2023-05-11 MED ORDER — CELECOXIB 100 MG PO CAPS: 100.0000 mg | ORAL_CAPSULE | Freq: Two times a day (BID) | ORAL | 0 refills | Status: DC

## 2023-05-11 NOTE — Progress Notes (Unsigned)
Post-Op Visit Note   Patient: Joseph [name]           Date of Birth: 1956-05-09           MRN: [id number] Visit Date: 05/11/2023 PCP: Joseph Dory, DO   Assessment & Plan:  Chief Complaint:  Chief Complaint  Patient presents with   Right Shoulder - Routine Post Op    04/28/23 RT TSA   Visit Diagnoses:  1. Shoulder arthritis     Plan: Joseph [name] is a patient who underwent removal of the implants 04/28/2023 for loosening of shoulder replacement.  Cavitary defect present in the glenoid.  Polyethylene wear was present.  All cultures are negative to date.  Patient is on IV antibiotics.  Dilaudid has not been helpful.  Plan at this time is to refill Celebrex and Robaxin.  Thin cut CT scan performed and formatted appropriately for vault reconstruction system.  Anticipate that should be able to go in within 6 weeks.  Shoulder is predictably stiff and painful at this time but the incision is intact and deltoid fires.  Follow-Up Instructions: No follow-ups on file.   Orders:  No orders of the defined types were placed in this encounter.  Meds ordered this encounter  Medications   celecoxib (CELEBREX) 100 MG capsule    Sig: Take 1 capsule (100 mg total) by mouth 2 (two) times daily.    Dispense:  60 capsule    Refill:  0   methocarbamol (ROBAXIN) 500 MG tablet    Sig: Take 1 tablet (500 mg total) by mouth every 8 (eight) hours as needed for muscle spasms.    Dispense:  45 tablet    Refill:  0    Imaging: No results found.  PMFS History: Patient Active Problem List   Diagnosis Date Noted   Prosthetic joint infection (HCC) 04/29/2023   OA (osteoarthritis) of shoulder 04/28/2023   S/P hardware removal 04/28/2023   Umbilical hernia without obstruction and without gangrene 07/05/2022   Insulin resistance 09/03/2019   Cubital tunnel syndrome 07/16/2019   Localized, primary osteoarthritis of shoulder region 07/16/2019   Shoulder pain 07/16/2019   Low testosterone  04/13/2018   Mixed hyperlipidemia    Bilateral sensorineural hearing loss 11/01/2017   Subjective tinnitus of right ear 11/01/2017   Hyperlipidemia 09/14/2017   History of tobacco abuse 09/14/2017   Overweight (BMI 25.0-29.9) 09/14/2017   Vertigo 07/14/2016   Arthritis of hand 02/09/2016   Right hand pain 02/09/2016   Cervical spondylosis with myelopathy 01/15/2016   Lumbar facet arthropathy 12/12/2015   Lumbar stenosis 12/12/2015   Meralgia paresthetica 12/12/2015   Neurofibroma 12/12/2015   Other intervertebral disc degeneration, lumbar region 12/12/2015   Dysphagia 10/31/2015   GERD (gastroesophageal reflux disease) 10/31/2015   Neuropathy 10/21/2015   Essential hypertension 02/11/2015   Shingles 07/10/2014   Low back pain 05/01/2014   Kidney stone 05/01/2014   Past Medical History:  Diagnosis Date   Arthritis    Complication of anesthesia    GERD (gastroesophageal reflux disease)    Heart murmur    as a child   Hypertension    Mixed hyperlipidemia    Osteoarthritis    Pneumonia    PONV (postoperative nausea and vomiting)    Prediabetes    Rheumatic fever    Vertigo     Family History  Problem Relation Age of Onset   Arthritis Mother        Rheumatoid   Diabetes Mother    Arthritis  Father    Hypertension Father    Diabetes Brother    Lupus Brother     Past Surgical History:  Procedure Laterality Date   BACK SURGERY  2012   JOINT REPLACEMENT Right 2013   SPINE SURGERY  1995   C4-5, C5-C6    SYNOVECTOMY WITH REMOVAL OF TOTAL SHOULDER Right 04/28/2023   Procedure: REMOVAL RIGHT SHOULDER IMPLANTS AND PLACEMENT OF ANTIBIOTIC SPACER;  Surgeon: Joseph Copa, MD;  Location: [facility] OR;  Service: Orthopedics;  Laterality: Right;   TONSILLECTOMY  1963   Social History   Occupational History   Occupation: Disability    Comment: Shoulder / Back  Tobacco Use   Smoking status: Former    Current packs/day: 0.00    Average packs/day: 1.5 packs/day for 20.0  years (30.0 ttl pk-yrs)    Types: Cigarettes    Start date: 05/01/1970    Quit date: 05/01/1990    Years since quitting: 33.0   Smokeless tobacco: Never  Vaping Use   Vaping status: Never Used  Substance and Sexual Activity   Alcohol use: No    Alcohol/week: 0.0 standard drinks of alcohol   Drug use: No   Sexual activity: Yes    Partners: Female

## 2023-05-16 ENCOUNTER — Telehealth: Payer: Self-pay

## 2023-05-16 ENCOUNTER — Inpatient Hospital Stay: Payer: Self-pay | Admitting: Infectious Diseases

## 2023-05-16 ENCOUNTER — Other Ambulatory Visit: Payer: Self-pay

## 2023-05-16 ENCOUNTER — Telehealth: Payer: Medicare Other | Admitting: Infectious Diseases

## 2023-05-16 ENCOUNTER — Inpatient Hospital Stay: Payer: PRIVATE HEALTH INSURANCE | Admitting: Infectious Diseases

## 2023-05-16 DIAGNOSIS — T8450XD Infection and inflammatory reaction due to unspecified internal joint prosthesis, subsequent encounter: Secondary | ICD-10-CM | POA: Diagnosis not present

## 2023-05-16 DIAGNOSIS — I10 Essential (primary) hypertension: Secondary | ICD-10-CM | POA: Diagnosis not present

## 2023-05-16 DIAGNOSIS — T84038D Mechanical loosening of other internal prosthetic joint, subsequent encounter: Secondary | ICD-10-CM | POA: Diagnosis not present

## 2023-05-16 DIAGNOSIS — Z452 Encounter for adjustment and management of vascular access device: Secondary | ICD-10-CM | POA: Diagnosis not present

## 2023-05-16 DIAGNOSIS — E782 Mixed hyperlipidemia: Secondary | ICD-10-CM | POA: Diagnosis not present

## 2023-05-16 DIAGNOSIS — Z79899 Other long term (current) drug therapy: Secondary | ICD-10-CM

## 2023-05-16 DIAGNOSIS — T8484XD Pain due to internal orthopedic prosthetic devices, implants and grafts, subsequent encounter: Secondary | ICD-10-CM | POA: Diagnosis not present

## 2023-05-16 DIAGNOSIS — K219 Gastro-esophageal reflux disease without esophagitis: Secondary | ICD-10-CM | POA: Diagnosis not present

## 2023-05-16 DIAGNOSIS — R7303 Prediabetes: Secondary | ICD-10-CM | POA: Diagnosis not present

## 2023-05-16 NOTE — Telephone Encounter (Signed)
Holding as reminder to send to Dr August Saucer on Thursday.

## 2023-05-16 NOTE — Progress Notes (Signed)
Hi Bonita Quin can you call Leland Johns and ask her if we can start Saladin on p.o. antibiotics after Wednesday which will be 3 weeks of IV antibiotics with negative cultures.  Thanks

## 2023-05-16 NOTE — Telephone Encounter (Signed)
Per Dr. Elinor Parkinson opat is still till 12/26 until she talks to surgeon. Patient told her that he only has enough Iv abx till 12/5. Dr. Elinor Parkinson messaged surgeon and will make a decision once she hears back from them.

## 2023-05-16 NOTE — Telephone Encounter (Signed)
-----   Message from Burnard Bunting sent at 05/12/2023 11:06 PM EST ----- Cresenciano Lick can you send me a note on Joseph [name] on December 5 to let him know that his cultures are negative so we can potentially discontinue the PICC line.  Thanks

## 2023-05-16 NOTE — Progress Notes (Unsigned)
Virtual Visit via Video Note  I connected withNAME@ on 05/16/23 at  3:00 PM EST by a video enabled telemedicine application and verified that I am speaking with the correct person using two identifiers.  Location: Patient: Home Provider: [facility]   I discussed the limitations of evaluation and management by telemedicine and the availability of in person appointments. The patient expressed understanding and agreed to proceed.  [facility]  Patient Active Problem List   Diagnosis Date Noted  . Medication management 05/16/2023  . PICC (peripherally inserted central catheter) in place 05/16/2023  . Prosthetic joint infection (HCC) 04/29/2023  . OA (osteoarthritis) of shoulder 04/28/2023  . S/P hardware removal 04/28/2023  . Umbilical hernia without obstruction and without gangrene 07/05/2022  . Insulin resistance 09/03/2019  . Cubital tunnel syndrome 07/16/2019  . Localized, primary osteoarthritis of shoulder region 07/16/2019  . Shoulder pain 07/16/2019  . Low testosterone 04/13/2018  . Mixed hyperlipidemia   . Bilateral sensorineural hearing loss 11/01/2017  . Subjective tinnitus of right ear 11/01/2017  . Hyperlipidemia 09/14/2017  . History of tobacco abuse 09/14/2017  . Overweight (BMI 25.0-29.9) 09/14/2017  . Vertigo 07/14/2016  . Arthritis of hand 02/09/2016  . Right hand pain 02/09/2016  . Cervical spondylosis with myelopathy 01/15/2016  . Lumbar facet arthropathy 12/12/2015  . Lumbar stenosis 12/12/2015  . Meralgia paresthetica 12/12/2015  . Neurofibroma 12/12/2015  . Other intervertebral disc degeneration, lumbar region 12/12/2015  . Dysphagia 10/31/2015  . GERD (gastroesophageal reflux disease) 10/31/2015  . Neuropathy 10/21/2015  . Essential hypertension 02/11/2015  . Shingles 07/10/2014  . Low back pain 05/01/2014  . Kidney stone 05/01/2014    Patient's Medications  New Prescriptions   No medications on file  Previous Medications    AMLODIPINE (NORVASC) 5 MG TABLET    Take 1 tablet (5 mg total) by mouth daily.   ATORVASTATIN (LIPITOR) 40 MG TABLET    Take 1 tablet by mouth once daily   CEFTRIAXONE (ROCEPHIN) IVPB    Inject 2 g into the vein daily. Indication:  Prosthetic Shoulder Infection First Dose: Yes Last Day of Therapy:  06/09/23 Labs - Once weekly:  CBC/D and BMP, Labs - Once weekly: ESR and CRP Method of administration: IV Push Method of administration may be changed at the discretion of home infusion pharmacist based upon assessment of the patient and/or caregiver's ability to self-administer the medication ordered.   CELECOXIB (CELEBREX) 100 MG CAPSULE    Take 1 capsule (100 mg total) by mouth 2 (two) times daily.   DAPTOMYCIN (CUBICIN) IVPB    Inject 700 mg into the vein daily. Indication:  Prosthetic Shoulder Infection First Dose: Yes Last Day of Therapy:  06/09/23 Labs - Once weekly:  CBC/D, BMP, and CPK Labs - Once weekly: ESR and CRP Method of administration: IV Push Method of administration may be changed at the discretion of home infusion pharmacist based upon assessment of the patient and/or caregiver's ability to self-administer the medication ordered.   DICLOFENAC SODIUM 1.5 % SOLN    APPLY TOPICALLY 3 TIMES DAILY AS NEEDED FOR PAIN   DOCUSATE SODIUM (COLACE) 100 MG CAPSULE    Take 1 capsule (100 mg total) by mouth 2 (two) times daily.   HYDROMORPHONE (DILAUDID) 2 MG TABLET    Take 0.5-1 tablets (1-2 mg total) by mouth every 4 (four) hours as needed for moderate pain (pain score 4-6) (pains score 4-6).   LIDOCAINE (LIDODERM) 5 %    Place 1 patch onto  the skin daily. Remove & Discard patch within 12 hours or as directed by MD   METHOCARBAMOL (ROBAXIN) 500 MG TABLET    Take 1 tablet (500 mg total) by mouth every 8 (eight) hours as needed for muscle spasms.   PANTOPRAZOLE (PROTONIX) 40 MG TABLET    Take 1 tablet by mouth twice daily  Modified Medications   No medications on file  Discontinued  Medications   No medications on file    History of Present Illness: ***  ROS  Past Medical History:  Diagnosis Date  . Arthritis   . Complication of anesthesia   . GERD (gastroesophageal reflux disease)   . Heart murmur    as a child  . Hypertension   . Mixed hyperlipidemia   . Osteoarthritis   . Pneumonia   . PONV (postoperative nausea and vomiting)   . Prediabetes   . Rheumatic fever   . Vertigo     Social History   Tobacco Use  . Smoking status: Former    Current packs/day: 0.00    Average packs/day: 1.5 packs/day for 20.0 years (30.0 ttl pk-yrs)    Types: Cigarettes    Start date: 05/01/1970    Quit date: 05/01/1990    Years since quitting: 33.0  . Smokeless tobacco: Never  Vaping Use  . Vaping status: Never Used  Substance Use Topics  . Alcohol use: No    Alcohol/week: 0.0 standard drinks of alcohol  . Drug use: No    Family History  Problem Relation Age of Onset  . Arthritis Mother        Rheumatoid  . Diabetes Mother   . Arthritis Father   . Hypertension Father   . Diabetes Brother   . Lupus Brother     Allergies  Allergen Reactions  . Hydrocodone-Acetaminophen Nausea Only and Nausea And Vomiting  . Other     Intolerance to narcotic medication - causes stomach upset and dizziness and does not relieve pain.     Health Maintenance  Topic Date Due  . Medicare Annual Wellness (AWV)  09/13/2018  . INFLUENZA VACCINE  01/13/2023  . DTaP/Tdap/Td (2 - Tdap) 09/12/2025  . Colonoscopy  11/24/2025  . Hepatitis C Screening  Completed  . HPV VACCINES  Aged Out  . Pneumonia Vaccine 47+ Years old  Discontinued  . COVID-19 Vaccine  Discontinued  . Zoster Vaccines- Shingrix  Discontinued    Observations/Objective:   Assessment and Plan: Assessment 67 year old male with PMH of rheumatic fever, prediabetes, HLD, HTN, OA, h/o multiple prior shoulder dislocations, right joint replacement in 2013 who presented with worsening right shoulder pain and  stiffness.     # RT shoulder PJI vs Particle disease due to wear and tear( more likely) - 11/14 status post removal of implant from right shoulder with extensive debridement and placement of antibiotic spacer.  Per OR note the glenoid was grossly loose.  Significant wear was present.  Intraoperative frozen section with 4 polys per high-power field and hence decision was made for implant removal and two-stage reimplantation.  OR cultures no organism on Gram stain, no growth to date. - d/w Dr August Saucer, low suspicion for infection but cx might take 2-3 weeks to grow if P acnes,. Old hardware out, plan for new hardware in several weeks, so would presumptively treat for infection with PICC line and IV abtx. Patient and wife agreeable  # Medication Management  11/17 hb 11.9, cr 1.36, ESR 13, CRP 48, CK 233  Follow Up  Instructions:    I discussed the assessment and treatment plan with the patient. The patient was provided an opportunity to ask questions and all were answered. The patient agreed with the plan and demonstrated an understanding of the instructions.   The patient was advised to call back or seek an in-person evaluation if the symptoms worsen or if the condition fails to improve as anticipated.  I provided *** minutes of non-face-to-face time during this encounter.  Of note, portions of this note may have been created with voice recognition software. While this note has been edited for accuracy, occasional wrong-word or 'sound-a-like' substitutions may have occurred due to the inherent limitations of voice recognition software.   Victoriano Lain, MD [facility] [phone] pager   [phone] cell 05/16/2023, 3:27 PM

## 2023-05-17 NOTE — Progress Notes (Signed)
Please call Joseph [name] today and tell him that his cultures are negative.  We have talked with infectious disease and we are going to change him over to p.o. antibiotics and discontinue the IV antibiotics.  He will need to have his PICC line removed as well.  Thanks

## 2023-05-18 ENCOUNTER — Telehealth: Payer: Self-pay

## 2023-05-18 LAB — AEROBIC/ANAEROBIC CULTURE W GRAM STAIN (SURGICAL/DEEP WOUND)
Culture: NO GROWTH
Gram Stain: NONE SEEN

## 2023-05-18 NOTE — Telephone Encounter (Signed)
-----   Message from Burnard Bunting sent at 05/17/2023  6:37 AM EST ----- Please call Joseph [name] today and tell him that his cultures are negative.  We have talked with infectious disease and we are going to change him over to p.o. antibiotics and discontinue the IV antibiotics.  He will need to have his PICC line removed as  well.  Thanks

## 2023-05-18 NOTE — Progress Notes (Signed)
On I talked to the infectious disease person she is going to get the PICC line pulled tomorrow and send in antibiotics as well.

## 2023-05-18 NOTE — Telephone Encounter (Signed)
What will antibiotics be changed to? When will they change and who will prescribe them? You or ID? Also who will he need to see to have PICC removed?

## 2023-05-19 ENCOUNTER — Telehealth: Payer: Self-pay

## 2023-05-19 MED ORDER — CEFADROXIL 500 MG PO CAPS: 1000.0000 mg | ORAL_CAPSULE | Freq: Two times a day (BID) | ORAL | 0 refills | Status: DC

## 2023-05-19 MED ORDER — DOXYCYCLINE HYCLATE 100 MG PO TABS: 100.0000 mg | ORAL_TABLET | Freq: Two times a day (BID) | ORAL | 0 refills | Status: DC

## 2023-05-19 NOTE — Telephone Encounter (Signed)
-----   Message from Victoriano Lain sent at 05/19/2023  6:03 AM EST ----- Regarding: OPAT Please inform HH that plan is to remove PICC, DC IV antibiotics today. His cultures have stayed negative 3 weeks out  Please let me know patient's preferred pharmacy and I can send doxycycline 100mg  po bid and cefadroxil 1g po bid for 3 weeks to finish off the course.   Thanks

## 2023-05-19 NOTE — Telephone Encounter (Signed)
Patient aware to d/c IV abx's today and HH will remove PICC line. Start doxycyline 100 mg po bid and cefadroxil 1g po bid for 3 weeks to finish off the course.  Message sent to [facility] and [facility] pharmacists.     Elliette Seabolt Lesli Albee, CMA

## 2023-05-19 NOTE — Telephone Encounter (Signed)
Id will handle I talked with them

## 2023-05-20 DIAGNOSIS — I10 Essential (primary) hypertension: Secondary | ICD-10-CM | POA: Diagnosis not present

## 2023-05-20 DIAGNOSIS — R7303 Prediabetes: Secondary | ICD-10-CM | POA: Diagnosis not present

## 2023-05-20 DIAGNOSIS — T84038D Mechanical loosening of other internal prosthetic joint, subsequent encounter: Secondary | ICD-10-CM | POA: Diagnosis not present

## 2023-05-20 DIAGNOSIS — K219 Gastro-esophageal reflux disease without esophagitis: Secondary | ICD-10-CM | POA: Diagnosis not present

## 2023-05-20 DIAGNOSIS — E782 Mixed hyperlipidemia: Secondary | ICD-10-CM | POA: Diagnosis not present

## 2023-05-20 DIAGNOSIS — T8484XD Pain due to internal orthopedic prosthetic devices, implants and grafts, subsequent encounter: Secondary | ICD-10-CM | POA: Diagnosis not present

## 2023-05-20 LAB — AEROBIC/ANAEROBIC CULTURE W GRAM STAIN (SURGICAL/DEEP WOUND)
Culture: NO GROWTH
Culture: NO GROWTH
Culture: NO GROWTH
Culture: NO GROWTH
Gram Stain: NONE SEEN

## 2023-05-25 ENCOUNTER — Other Ambulatory Visit: Payer: Self-pay | Admitting: Family Medicine

## 2023-05-26 ENCOUNTER — Other Ambulatory Visit (HOSPITAL_COMMUNITY): Payer: Self-pay

## 2023-05-27 DIAGNOSIS — T8484XD Pain due to internal orthopedic prosthetic devices, implants and grafts, subsequent encounter: Secondary | ICD-10-CM | POA: Diagnosis not present

## 2023-05-27 DIAGNOSIS — T84038D Mechanical loosening of other internal prosthetic joint, subsequent encounter: Secondary | ICD-10-CM | POA: Diagnosis not present

## 2023-05-27 DIAGNOSIS — R7303 Prediabetes: Secondary | ICD-10-CM | POA: Diagnosis not present

## 2023-05-27 DIAGNOSIS — E782 Mixed hyperlipidemia: Secondary | ICD-10-CM | POA: Diagnosis not present

## 2023-05-27 DIAGNOSIS — I10 Essential (primary) hypertension: Secondary | ICD-10-CM | POA: Diagnosis not present

## 2023-05-27 DIAGNOSIS — K219 Gastro-esophageal reflux disease without esophagitis: Secondary | ICD-10-CM | POA: Diagnosis not present

## 2023-06-06 ENCOUNTER — Encounter: Payer: Self-pay | Admitting: Family Medicine

## 2023-06-16 DIAGNOSIS — B379 Candidiasis, unspecified: Secondary | ICD-10-CM | POA: Diagnosis not present

## 2023-06-17 ENCOUNTER — Ambulatory Visit: Payer: Medicare Other | Admitting: Medical

## 2023-07-06 ENCOUNTER — Other Ambulatory Visit: Payer: Self-pay | Admitting: Family Medicine

## 2023-07-06 DIAGNOSIS — M545 Low back pain, unspecified: Secondary | ICD-10-CM

## 2023-07-13 ENCOUNTER — Other Ambulatory Visit: Payer: Self-pay | Admitting: Family Medicine

## 2023-07-13 DIAGNOSIS — M545 Low back pain, unspecified: Secondary | ICD-10-CM

## 2023-07-15 ENCOUNTER — Encounter: Payer: Self-pay | Admitting: Orthopedic Surgery

## 2023-07-19 NOTE — Pre-Procedure Instructions (Addendum)
 Surgical Instructions   Your procedure is scheduled on Tuesday, February 11th. Report to [facility] Main Entrance A at 05:30 A.M., then check in with the Admitting office. Any questions or running late day of surgery: call [phone]  Questions prior to your surgery date: call [phone], Monday-Friday, 8am-4pm. If you experience any cold or flu symptoms such as cough, fever, chills, shortness of breath, etc. between now and your scheduled surgery, please notify us  at the above number.     Remember:  Do not eat after midnight the night before your surgery  You may drink clear liquids until 04:30 AM the morning of your surgery.   Clear liquids allowed are: Water , Non-Citrus Juices (without pulp), Carbonated Beverages, Clear Tea (no milk, honey, etc.), Black Coffee Only (NO MILK, CREAM OR POWDERED CREAMER of any kind), and Gatorade.  Patient Instructions  The night before surgery:  No food after midnight. ONLY clear liquids after midnight  The day of surgery (if you do NOT have diabetes):  Drink ONE (1) Pre-Surgery Clear Ensure by 04:30 AM the morning of surgery. Drink in one sitting. Do not sip.  This drink was given to you during your hospital  pre-op appointment visit.  Nothing else to drink after completing the  Pre-Surgery Clear Ensure.          If you have questions, please contact your surgeon's office.    Take these medicines the morning of surgery with A SIP OF WATER   amLODipine  (NORVASC )  pantoprazole  (PROTONIX )     One week prior to surgery, STOP taking any Aspirin  (unless otherwise instructed by your surgeon) Aleve, Naproxen, Ibuprofen, Motrin, Advil, Goody's, BC's, all herbal medications, fish oil, and non-prescription vitamins. This includes Diclofenac  Sodium gel and meloxicam  (MOBIC ).                     Do NOT Smoke (Tobacco/Vaping) for 24 hours prior to your procedure.  If you use a CPAP at night, you may bring your mask/headgear for your overnight  stay.   You will be asked to remove any contacts, glasses, piercing's, hearing aid's, dentures/partials prior to surgery. Please bring cases for these items if needed.    Patients discharged the day of surgery will not be allowed to drive home, and someone needs to stay with them for 24 hours.  SURGICAL WAITING ROOM VISITATION Patients may have no more than 2 support people in the waiting area - these visitors may rotate.   Pre-op nurse will coordinate an appropriate time for 1 ADULT support person, who may not rotate, to accompany patient in pre-op.  Children under the age of 51 must have an adult with them who is not the patient and must remain in the main waiting area with an adult.  If the patient needs to stay at the hospital during part of their recovery, the visitor guidelines for inpatient rooms apply.  Please refer to the [facility] website for the visitor guidelines for any additional information.   If you received a COVID test during your pre-op visit  it is requested that you wear a mask when out in public, stay away from anyone that may not be feeling well and notify your surgeon if you develop symptoms. If you have been in contact with anyone that has tested positive in the last 10 days please notify you surgeon.      Pre-operative 5 CHG Bathing Instructions   You can play a key role in reducing the risk  of infection after surgery. Your skin needs to be as free of germs as possible. You can reduce the number of germs on your skin by washing with CHG (chlorhexidine  gluconate) soap before surgery. CHG is an antiseptic soap that kills germs and continues to kill germs even after washing.   DO NOT use if you have an allergy to chlorhexidine /CHG or antibacterial soaps. If your skin becomes reddened or irritated, stop using the CHG and notify one of our RNs at [phone].   Please shower with the CHG soap starting 4 days before surgery using the following schedule:      Please keep in mind the following:  DO NOT shave, including legs and underarms, starting the day of your first shower.   You may shave your face at any point before/day of surgery.  Place clean sheets on your bed the day you start using CHG soap. Use a clean washcloth (not used since being washed) for each shower. DO NOT sleep with pets once you start using the CHG.   CHG Shower Instructions:  Wash your face and private area with normal soap. If you choose to wash your hair, wash first with your normal shampoo.  After you use shampoo/soap, rinse your hair and body thoroughly to remove shampoo/soap residue.  Turn the water  OFF and apply about 3 tablespoons (45 ml) of CHG soap to a CLEAN washcloth.  Apply CHG soap ONLY FROM YOUR NECK DOWN TO YOUR TOES (washing for 3-5 minutes)  DO NOT use CHG soap on face, private areas, open wounds, or sores.  Pay special attention to the area where your surgery is being performed.  If you are having back surgery, having someone wash your back for you may be helpful. Wait 2 minutes after CHG soap is applied, then you may rinse off the CHG soap.  Pat dry with a clean towel  Put on clean clothes/pajamas   If you choose to wear lotion, please use ONLY the CHG-compatible lotions that are listed below.  Additional instructions for the day of surgery: DO NOT APPLY any lotions, deodorants, cologne, or perfumes.   Do not bring valuables to the hospital. [facility] is not responsible for any belongings/valuables. Do not wear nail polish, gel polish, artificial nails, or any other type of covering on natural nails (fingers and toes) Do not wear jewelry or makeup Put on clean/comfortable clothes.  Please brush your teeth.  Ask your nurse before applying any prescription medications to the skin.     CHG Compatible Lotions   Aveeno Moisturizing lotion  Cetaphil Moisturizing Cream  Cetaphil Moisturizing Lotion  Clairol Herbal Essence Moisturizing  Lotion, Dry Skin  Clairol Herbal Essence Moisturizing Lotion, Extra Dry Skin  Clairol Herbal Essence Moisturizing Lotion, Normal Skin  Curel Age Defying Therapeutic Moisturizing Lotion with Alpha Hydroxy  Curel Extreme Care Body Lotion  Curel Soothing Hands Moisturizing Hand Lotion  Curel Therapeutic Moisturizing Cream, Fragrance-Free  Curel Therapeutic Moisturizing Lotion, Fragrance-Free  Curel Therapeutic Moisturizing Lotion, Original Formula  Eucerin Daily Replenishing Lotion  Eucerin Dry Skin Therapy Plus Alpha Hydroxy Crme  Eucerin Dry Skin Therapy Plus Alpha Hydroxy Lotion  Eucerin Original Crme  Eucerin Original Lotion  Eucerin Plus Crme Eucerin Plus Lotion  Eucerin TriLipid Replenishing Lotion  Keri Anti-Bacterial Hand Lotion  Keri Deep Conditioning Original Lotion Dry Skin Formula Softly Scented  Keri Deep Conditioning Original Lotion, Fragrance Free Sensitive Skin Formula  Keri Lotion Fast Absorbing Fragrance Free Sensitive Skin Formula  Keri Lotion Fast Absorbing  Softly Scented Dry Skin Formula  Keri Original Lotion  Keri Skin Renewal Lotion Keri Silky Smooth Lotion  Keri Silky Smooth Sensitive Skin Lotion  Nivea Body Creamy Conditioning Oil  Nivea Body Extra Enriched Teacher, Adult Education Moisturizing Lotion Nivea Crme  Nivea Skin Firming Lotion  NutraDerm 30 Skin Lotion  NutraDerm Skin Lotion  NutraDerm Therapeutic Skin Cream  NutraDerm Therapeutic Skin Lotion  ProShield Protective Hand Cream  Provon moisturizing lotion    Before surgery, you can play an important role. Because skin is not sterile, your skin needs to be as free of germs as possible. You can reduce the number of germs on your skin by using the following products.   Benzoyl Peroxide Gel  o Reduces the number of germs present on the skin  o Applied twice a day to shoulder area starting two days before  surgery   ==================================================================  Please follow these instructions carefully:  BENZOYL PEROXIDE 5% GEL  Please do not use if you have an allergy to benzoyl peroxide. If your skin becomes reddened/irritated stop using the benzoyl peroxide.  Starting two days before surgery, apply as follows:  1. Apply benzoyl peroxide in the morning and at night. Apply after taking a shower. If you are not taking a shower clean entire shoulder front, back, and side along with the armpit with a clean wet washcloth.  2. Place a quarter-sized dollop on your SHOULDER and rub in thoroughly, making sure to cover the front, back, and side of your shoulder, along with the armpit.   2 Days prior to Surgery First Dose on _____________ Morning Second Dose on ______________ Night  Day Before Surgery First Dose on ______________ Morning Night before surgery wash (entire body except face and private areas) with CHG Soap THEN Second Dose on ____________ Night   Morning of Surgery  wash BODY AGAIN with CHG Soap   4. Do NOT apply benzoyl peroxide gel on the day of surgery     Please read over the following fact sheets that you were given.

## 2023-07-20 ENCOUNTER — Other Ambulatory Visit: Payer: Self-pay

## 2023-07-20 ENCOUNTER — Encounter (HOSPITAL_COMMUNITY)
Admission: RE | Admit: 2023-07-20 | Discharge: 2023-07-20 | Disposition: A | Discharge status: Home / Self Care | Payer: Medicare Other | Source: Ambulatory Visit | Attending: Orthopedic Surgery | Admitting: Orthopedic Surgery

## 2023-07-20 ENCOUNTER — Encounter (HOSPITAL_COMMUNITY): Payer: Self-pay

## 2023-07-20 VITALS — BP 137/87 | HR 85 | Temp 98.1°F | Resp 18 | Ht 74.0 in | Wt 221.4 lb

## 2023-07-20 DIAGNOSIS — Z01812 Encounter for preprocedural laboratory examination: Secondary | ICD-10-CM | POA: Insufficient documentation

## 2023-07-20 DIAGNOSIS — Z01818 Encounter for other preprocedural examination: Secondary | ICD-10-CM

## 2023-07-20 LAB — BASIC METABOLIC PANEL
Anion gap: 8 (ref 5–15)
BUN: 17 mg/dL (ref 8–23)
CO2: 26 mmol/L (ref 22–32)
Calcium: 9.4 mg/dL (ref 8.9–10.3)
Chloride: 105 mmol/L (ref 98–111)
Creatinine, Ser: 1.44 mg/dL — ABNORMAL HIGH (ref 0.61–1.24)
GFR, Estimated: 53 mL/min — ABNORMAL LOW (ref >60)
Glucose, Bld: 94 mg/dL (ref 70–99)
Potassium: 4.2 mmol/L (ref 3.5–5.1)
Sodium: 139 mmol/L (ref 135–145)

## 2023-07-20 LAB — CBC
HCT: 42.9 % (ref 39.0–52.0)
Hemoglobin: 14.3 g/dL (ref 13.0–17.0)
MCH: 29.5 pg (ref 26.0–34.0)
MCHC: 33.3 g/dL (ref 30.0–36.0)
MCV: 88.6 fL (ref 80.0–100.0)
Platelets: 225 10*3/uL (ref 150–400)
RBC: 4.84 MIL/uL (ref 4.22–5.81)
RDW: 12.7 % (ref 11.5–15.5)
WBC: 6.6 10*3/uL (ref 4.0–10.5)
nRBC: 0 % (ref 0.0–0.2)

## 2023-07-20 LAB — URINALYSIS, W/ REFLEX TO CULTURE (INFECTION SUSPECTED)
Bacteria, UA: NONE SEEN
Bilirubin Urine: NEGATIVE
Glucose, UA: NEGATIVE mg/dL
Hgb urine dipstick: NEGATIVE
Ketones, ur: NEGATIVE mg/dL
Leukocytes,Ua: NEGATIVE
Nitrite: NEGATIVE
Protein, ur: NEGATIVE mg/dL
Specific Gravity, Urine: 1.009 (ref 1.005–1.030)
pH: 5 (ref 5.0–8.0)

## 2023-07-20 LAB — SURGICAL PCR SCREEN
MRSA, PCR: NEGATIVE
Staphylococcus aureus: NEGATIVE

## 2023-07-20 NOTE — Progress Notes (Signed)
I called implant shipped

## 2023-07-20 NOTE — Progress Notes (Signed)
 PCP - Dr. Mabel Pry Cardiologist - denies  PPM/ICD - denies   Chest x-ray - 07/07/19 EKG - 04/21/23 Stress Test - denies ECHO - denies Cardiac Cath - denies  Sleep Study - denies   DM- denies  Last dose of GLP1 agonist-  n/a   ASA/Blood Thinner Instructions: n/a   ERAS Protcol - yes PRE-SURGERY Ensure given at PAT  COVID TEST- n/a   Anesthesia review: no  Patient denies shortness of breath, fever, cough and chest pain at PAT appointment   All instructions explained to the patient, with a verbal understanding of the material. Patient agrees to go over the instructions while at home for a better understanding.  The opportunity to ask questions was provided.

## 2023-07-26 ENCOUNTER — Other Ambulatory Visit: Payer: Self-pay

## 2023-07-26 ENCOUNTER — Inpatient Hospital Stay (HOSPITAL_COMMUNITY): Payer: Medicare Other | Admitting: Physician Assistant

## 2023-07-26 ENCOUNTER — Observation Stay (HOSPITAL_COMMUNITY): Payer: Medicare Other

## 2023-07-26 ENCOUNTER — Inpatient Hospital Stay (HOSPITAL_COMMUNITY)
Admission: RE | Admit: 2023-07-26 | Discharge: 2023-07-28 | DRG: 483 | Disposition: A | Discharge status: Home / Self Care | Payer: Medicare Other | Attending: Orthopedic Surgery | Admitting: Orthopedic Surgery

## 2023-07-26 ENCOUNTER — Inpatient Hospital Stay (HOSPITAL_COMMUNITY): Payer: Medicare Other | Admitting: Anesthesiology

## 2023-07-26 ENCOUNTER — Encounter (HOSPITAL_COMMUNITY)
Admission: RE | Disposition: A | Discharge status: Home / Self Care | Payer: Self-pay | Source: Home / Self Care | Attending: Orthopedic Surgery

## 2023-07-26 ENCOUNTER — Encounter (HOSPITAL_COMMUNITY): Payer: Self-pay | Admitting: Orthopedic Surgery

## 2023-07-26 DIAGNOSIS — T84098A Other mechanical complication of other internal joint prosthesis, initial encounter: Secondary | ICD-10-CM | POA: Diagnosis not present

## 2023-07-26 DIAGNOSIS — T84038D Mechanical loosening of other internal prosthetic joint, subsequent encounter: Secondary | ICD-10-CM | POA: Diagnosis not present

## 2023-07-26 DIAGNOSIS — Z8249 Family history of ischemic heart disease and other diseases of the circulatory system: Secondary | ICD-10-CM | POA: Diagnosis not present

## 2023-07-26 DIAGNOSIS — Z791 Long term (current) use of non-steroidal anti-inflammatories (NSAID): Secondary | ICD-10-CM | POA: Diagnosis not present

## 2023-07-26 DIAGNOSIS — T84038A Mechanical loosening of other internal prosthetic joint, initial encounter: Secondary | ICD-10-CM

## 2023-07-26 DIAGNOSIS — M24111 Other articular cartilage disorders, right shoulder: Secondary | ICD-10-CM | POA: Diagnosis present

## 2023-07-26 DIAGNOSIS — M7501 Adhesive capsulitis of right shoulder: Secondary | ICD-10-CM | POA: Diagnosis present

## 2023-07-26 DIAGNOSIS — E782 Mixed hyperlipidemia: Secondary | ICD-10-CM | POA: Diagnosis present

## 2023-07-26 DIAGNOSIS — Z87891 Personal history of nicotine dependence: Secondary | ICD-10-CM

## 2023-07-26 DIAGNOSIS — Z01818 Encounter for other preprocedural examination: Principal | ICD-10-CM

## 2023-07-26 DIAGNOSIS — Z833 Family history of diabetes mellitus: Secondary | ICD-10-CM | POA: Diagnosis not present

## 2023-07-26 DIAGNOSIS — I10 Essential (primary) hypertension: Secondary | ICD-10-CM | POA: Diagnosis not present

## 2023-07-26 DIAGNOSIS — Z79899 Other long term (current) drug therapy: Secondary | ICD-10-CM | POA: Diagnosis not present

## 2023-07-26 DIAGNOSIS — Z8261 Family history of arthritis: Secondary | ICD-10-CM | POA: Diagnosis not present

## 2023-07-26 DIAGNOSIS — Z96619 Presence of unspecified artificial shoulder joint: Secondary | ICD-10-CM

## 2023-07-26 DIAGNOSIS — Z4731 Aftercare following explantation of shoulder joint prosthesis: Secondary | ICD-10-CM | POA: Diagnosis present

## 2023-07-26 DIAGNOSIS — Z8269 Family history of other diseases of the musculoskeletal system and connective tissue: Secondary | ICD-10-CM

## 2023-07-26 DIAGNOSIS — K219 Gastro-esophageal reflux disease without esophagitis: Secondary | ICD-10-CM | POA: Diagnosis present

## 2023-07-26 DIAGNOSIS — Z885 Allergy status to narcotic agent status: Secondary | ICD-10-CM | POA: Diagnosis not present

## 2023-07-26 DIAGNOSIS — T849XXA Unspecified complication of internal orthopedic prosthetic device, implant and graft, initial encounter: Secondary | ICD-10-CM | POA: Diagnosis not present

## 2023-07-26 DIAGNOSIS — G8918 Other acute postprocedural pain: Secondary | ICD-10-CM | POA: Diagnosis not present

## 2023-07-26 DIAGNOSIS — Z96611 Presence of right artificial shoulder joint: Secondary | ICD-10-CM | POA: Diagnosis not present

## 2023-07-26 HISTORY — PX: TOTAL SHOULDER REVISION: SHX6130

## 2023-07-26 SURGERY — REVISION, TOTAL ARTHROPLASTY, SHOULDER
Anesthesia: General | Site: Shoulder | Laterality: Right

## 2023-07-26 MED ORDER — 0.9 % SODIUM CHLORIDE (POUR BTL) OPTIME
TOPICAL | Status: DC | PRN
  Administered 2023-07-26: 1000 mL

## 2023-07-26 MED ORDER — PANTOPRAZOLE SODIUM 40 MG PO TBEC
40.0000 mg | DELAYED_RELEASE_TABLET | Freq: Two times a day (BID) | ORAL | Status: DC
  Administered 2023-07-26 – 2023-07-28 (×4): 40 mg via ORAL
  Filled 2023-07-26 (×6): qty 1

## 2023-07-26 MED ORDER — BUPIVACAINE LIPOSOME 1.3 % IJ SUSP
INTRAMUSCULAR | Status: DC | PRN
  Administered 2023-07-26: 10 mL

## 2023-07-26 MED ORDER — CEFAZOLIN SODIUM-DEXTROSE 2-4 GM/100ML-% IV SOLN
2.0000 g | Freq: Three times a day (TID) | INTRAVENOUS | Status: AC
  Administered 2023-07-27 (×3): 2 g via INTRAVENOUS
  Filled 2023-07-26 (×3): qty 100

## 2023-07-26 MED ORDER — ORAL CARE MOUTH RINSE: 15.0000 mL | Freq: Once | OROMUCOSAL | Status: AC

## 2023-07-26 MED ORDER — VANCOMYCIN HCL 1000 MG IV SOLR
INTRAVENOUS | Status: AC
Start: 2023-07-26 — End: ?
  Filled 2023-07-26: qty 20

## 2023-07-26 MED ORDER — PROPOFOL 10 MG/ML IV BOLUS
INTRAVENOUS | Status: AC
  Filled 2023-07-26: qty 20

## 2023-07-26 MED ORDER — PHENYLEPHRINE 80 MCG/ML (10ML) SYRINGE FOR IV PUSH (FOR BLOOD PRESSURE SUPPORT)
PREFILLED_SYRINGE | INTRAVENOUS | Status: DC | PRN
  Administered 2023-07-26: 160 ug via INTRAVENOUS
  Administered 2023-07-26 (×3): 80 ug via INTRAVENOUS

## 2023-07-26 MED ORDER — PROPOFOL 10 MG/ML IV BOLUS
INTRAVENOUS | Status: DC | PRN
  Administered 2023-07-26: 150 mg via INTRAVENOUS
  Administered 2023-07-26: 50 mg via INTRAVENOUS

## 2023-07-26 MED ORDER — DEXAMETHASONE SODIUM PHOSPHATE 10 MG/ML IJ SOLN
INTRAMUSCULAR | Status: DC | PRN
  Administered 2023-07-26: 10 mg via INTRAVENOUS

## 2023-07-26 MED ORDER — METHOCARBAMOL 1000 MG/10ML IJ SOLN
500.0000 mg | Freq: Four times a day (QID) | INTRAMUSCULAR | Status: DC | PRN
  Administered 2023-07-27: 500 mg via INTRAVENOUS
  Filled 2023-07-26: qty 10

## 2023-07-26 MED ORDER — HYDROMORPHONE HCL 1 MG/ML IJ SOLN
0.5000 mg | INTRAMUSCULAR | Status: DC | PRN
  Administered 2023-07-27 (×2): 0.5 mg via INTRAVENOUS
  Filled 2023-07-26 (×2): qty 0.5

## 2023-07-26 MED ORDER — ACETAMINOPHEN 325 MG PO TABS
325.0000 mg | ORAL_TABLET | Freq: Four times a day (QID) | ORAL | Status: DC | PRN
  Administered 2023-07-28: 325 mg via ORAL
  Filled 2023-07-26: qty 1

## 2023-07-26 MED ORDER — FENTANYL CITRATE (PF) 250 MCG/5ML IJ SOLN
INTRAMUSCULAR | Status: AC
  Filled 2023-07-26: qty 5

## 2023-07-26 MED ORDER — METOCLOPRAMIDE HCL 5 MG PO TABS: 5.0000 mg | ORAL_TABLET | Freq: Three times a day (TID) | ORAL | Status: DC | PRN

## 2023-07-26 MED ORDER — FENTANYL CITRATE (PF) 100 MCG/2ML IJ SOLN: 25.0000 ug | INTRAMUSCULAR | Status: DC | PRN

## 2023-07-26 MED ORDER — OXYCODONE HCL 5 MG/5ML PO SOLN: 5.0000 mg | Freq: Once | ORAL | Status: DC | PRN

## 2023-07-26 MED ORDER — CHLORHEXIDINE GLUCONATE 0.12 % MT SOLN
15.0000 mL | Freq: Once | OROMUCOSAL | Status: AC
  Administered 2023-07-26: 15 mL via OROMUCOSAL
  Filled 2023-07-26: qty 15

## 2023-07-26 MED ORDER — HYDROMORPHONE HCL 2 MG PO TABS
1.0000 mg | ORAL_TABLET | ORAL | Status: DC | PRN
  Administered 2023-07-27 – 2023-07-28 (×3): 2 mg via ORAL
  Filled 2023-07-26 (×3): qty 1

## 2023-07-26 MED ORDER — ROCURONIUM BROMIDE 10 MG/ML (PF) SYRINGE
PREFILLED_SYRINGE | INTRAVENOUS | Status: DC | PRN
  Administered 2023-07-26: 50 mg via INTRAVENOUS

## 2023-07-26 MED ORDER — VANCOMYCIN HCL 1000 MG IV SOLR
INTRAVENOUS | Status: DC | PRN
  Administered 2023-07-26: 1000 mg

## 2023-07-26 MED ORDER — FENTANYL CITRATE (PF) 250 MCG/5ML IJ SOLN
INTRAMUSCULAR | Status: DC | PRN
Start: 2023-07-26 — End: 2023-07-26
  Administered 2023-07-26 (×3): 50 ug via INTRAVENOUS

## 2023-07-26 MED ORDER — ACETAMINOPHEN 500 MG PO TABS
1000.0000 mg | ORAL_TABLET | Freq: Four times a day (QID) | ORAL | Status: AC
  Administered 2023-07-27 (×2): 1000 mg via ORAL
  Filled 2023-07-26 (×2): qty 2

## 2023-07-26 MED ORDER — ROCURONIUM BROMIDE 10 MG/ML (PF) SYRINGE
PREFILLED_SYRINGE | INTRAVENOUS | Status: AC
  Filled 2023-07-26: qty 10

## 2023-07-26 MED ORDER — PHENYLEPHRINE 80 MCG/ML (10ML) SYRINGE FOR IV PUSH (FOR BLOOD PRESSURE SUPPORT)
PREFILLED_SYRINGE | INTRAVENOUS | Status: AC
  Filled 2023-07-26: qty 10

## 2023-07-26 MED ORDER — LIDOCAINE 2% (20 MG/ML) 5 ML SYRINGE
INTRAMUSCULAR | Status: DC | PRN
  Administered 2023-07-26: 40 mg via INTRAVENOUS

## 2023-07-26 MED ORDER — ONDANSETRON HCL 4 MG/2ML IJ SOLN: 4.0000 mg | Freq: Four times a day (QID) | INTRAMUSCULAR | Status: DC | PRN

## 2023-07-26 MED ORDER — BUPIVACAINE HCL (PF) 0.5 % IJ SOLN
INTRAMUSCULAR | Status: DC | PRN
  Administered 2023-07-26: 15 mL via PERINEURAL

## 2023-07-26 MED ORDER — METOCLOPRAMIDE HCL 5 MG/ML IJ SOLN: 5.0000 mg | Freq: Three times a day (TID) | INTRAMUSCULAR | Status: DC | PRN

## 2023-07-26 MED ORDER — IRRISEPT - 450ML BOTTLE WITH 0.05% CHG IN STERILE WATER, USP 99.95% OPTIME
TOPICAL | Status: DC | PRN
  Administered 2023-07-26: 450 mL via TOPICAL

## 2023-07-26 MED ORDER — PHENOL 1.4 % MT LIQD: 1.0000 | OROMUCOSAL | Status: DC | PRN

## 2023-07-26 MED ORDER — CELECOXIB 100 MG PO CAPS
100.0000 mg | ORAL_CAPSULE | Freq: Two times a day (BID) | ORAL | Status: DC
  Administered 2023-07-27 (×2): 100 mg via ORAL
  Filled 2023-07-26 (×3): qty 1

## 2023-07-26 MED ORDER — LIDOCAINE 5 % EX PTCH
1.0000 | MEDICATED_PATCH | CUTANEOUS | Status: DC
  Administered 2023-07-26 – 2023-07-27 (×2): 1 via TRANSDERMAL
  Filled 2023-07-26 (×2): qty 1

## 2023-07-26 MED ORDER — ONDANSETRON HCL 4 MG/2ML IJ SOLN
INTRAMUSCULAR | Status: DC | PRN
  Administered 2023-07-26: 4 mg via INTRAVENOUS

## 2023-07-26 MED ORDER — LACTATED RINGERS IV SOLN: INTRAVENOUS | Status: DC

## 2023-07-26 MED ORDER — TRANEXAMIC ACID-NACL 1000-0.7 MG/100ML-% IV SOLN
1000.0000 mg | INTRAVENOUS | Status: AC
  Administered 2023-07-26: 1000 mg via INTRAVENOUS
  Filled 2023-07-26: qty 100

## 2023-07-26 MED ORDER — KETAMINE HCL 50 MG/5ML IJ SOSY
PREFILLED_SYRINGE | INTRAMUSCULAR | Status: DC | PRN
  Administered 2023-07-26: 5 mg via INTRAVENOUS
  Administered 2023-07-26: 15 mg via INTRAVENOUS

## 2023-07-26 MED ORDER — DOCUSATE SODIUM 100 MG PO CAPS
100.0000 mg | ORAL_CAPSULE | Freq: Two times a day (BID) | ORAL | Status: DC
  Administered 2023-07-26 – 2023-07-28 (×4): 100 mg via ORAL
  Filled 2023-07-26 (×4): qty 1

## 2023-07-26 MED ORDER — ONDANSETRON HCL 4 MG PO TABS: 4.0000 mg | ORAL_TABLET | Freq: Four times a day (QID) | ORAL | Status: DC | PRN

## 2023-07-26 MED ORDER — SUGAMMADEX SODIUM 200 MG/2ML IV SOLN
INTRAVENOUS | Status: DC | PRN
  Administered 2023-07-26: 20 mg via INTRAVENOUS

## 2023-07-26 MED ORDER — METHOCARBAMOL 500 MG PO TABS
500.0000 mg | ORAL_TABLET | Freq: Four times a day (QID) | ORAL | Status: DC | PRN
  Administered 2023-07-28: 500 mg via ORAL
  Filled 2023-07-26: qty 1

## 2023-07-26 MED ORDER — LIDOCAINE 2% (20 MG/ML) 5 ML SYRINGE
INTRAMUSCULAR | Status: AC
  Filled 2023-07-26: qty 5

## 2023-07-26 MED ORDER — DEXAMETHASONE SODIUM PHOSPHATE 10 MG/ML IJ SOLN
INTRAMUSCULAR | Status: AC
  Filled 2023-07-26: qty 1

## 2023-07-26 MED ORDER — POVIDONE-IODINE 7.5 % EX SOLN: Freq: Once | CUTANEOUS | Status: DC

## 2023-07-26 MED ORDER — OXYCODONE HCL 5 MG PO TABS: 5.0000 mg | ORAL_TABLET | Freq: Once | ORAL | Status: DC | PRN

## 2023-07-26 MED ORDER — ONDANSETRON HCL 4 MG/2ML IJ SOLN
INTRAMUSCULAR | Status: AC
Start: 2023-07-26 — End: ?
  Filled 2023-07-26: qty 2

## 2023-07-26 MED ORDER — ASPIRIN 81 MG PO TBEC
81.0000 mg | DELAYED_RELEASE_TABLET | Freq: Every day | ORAL | Status: DC
  Administered 2023-07-27 – 2023-07-28 (×2): 81 mg via ORAL
  Filled 2023-07-26 (×2): qty 1

## 2023-07-26 MED ORDER — MENTHOL 3 MG MT LOZG: 1.0000 | LOZENGE | OROMUCOSAL | Status: DC | PRN

## 2023-07-26 MED ORDER — AMLODIPINE BESYLATE 5 MG PO TABS
5.0000 mg | ORAL_TABLET | Freq: Every day | ORAL | Status: DC
  Administered 2023-07-27 – 2023-07-28 (×2): 5 mg via ORAL
  Filled 2023-07-26 (×2): qty 1

## 2023-07-26 MED ORDER — PHENYLEPHRINE HCL-NACL 20-0.9 MG/250ML-% IV SOLN
INTRAVENOUS | Status: DC | PRN
  Administered 2023-07-26: 15 ug/min via INTRAVENOUS

## 2023-07-26 MED ORDER — PROPOFOL 500 MG/50ML IV EMUL
INTRAVENOUS | Status: DC | PRN
  Administered 2023-07-26: 120 ug/kg/min via INTRAVENOUS
  Administered 2023-07-26: 80 ug/kg/min via INTRAVENOUS

## 2023-07-26 MED ORDER — EPHEDRINE 5 MG/ML INJ
INTRAVENOUS | Status: AC
  Filled 2023-07-26: qty 5

## 2023-07-26 MED ORDER — EPHEDRINE SULFATE-NACL 50-0.9 MG/10ML-% IV SOSY
PREFILLED_SYRINGE | INTRAVENOUS | Status: DC | PRN
  Administered 2023-07-26: 5 mg via INTRAVENOUS

## 2023-07-26 MED ORDER — MIDAZOLAM HCL 2 MG/2ML IJ SOLN
INTRAMUSCULAR | Status: DC | PRN
  Administered 2023-07-26 (×2): 1 mg via INTRAVENOUS

## 2023-07-26 MED ORDER — MIDAZOLAM HCL 2 MG/2ML IJ SOLN
INTRAMUSCULAR | Status: AC
Start: 2023-07-26 — End: ?
  Filled 2023-07-26: qty 2

## 2023-07-26 MED ORDER — CEFAZOLIN SODIUM-DEXTROSE 2-4 GM/100ML-% IV SOLN
2.0000 g | INTRAVENOUS | Status: AC
  Administered 2023-07-26 (×2): 2 g via INTRAVENOUS
  Filled 2023-07-26: qty 100

## 2023-07-26 MED ORDER — POVIDONE-IODINE 10 % EX SWAB
2.0000 | Freq: Once | CUTANEOUS | Status: AC
  Administered 2023-07-26: 2 via TOPICAL

## 2023-07-26 SURGICAL SUPPLY — 76 items
ADAPTER VERSA DIAL 25 (Orthopedic Implant) IMPLANT
ALCOHOL 70% 16 OZ (MISCELLANEOUS) ×1 IMPLANT
BAG COUNTER SPONGE SURGICOUNT (BAG) ×1 IMPLANT
BEARING HUMERAL 40 STD VITE (Joint) IMPLANT
BIT DRILL COMPR VRS 2.7 (DRILL) IMPLANT
BIT DRILL F/CENTRAL SCRW 3.2 (BIT) ×1 IMPLANT
BIT DRILL F/CENTRAL SCRW 3.2MM (BIT) IMPLANT
BIT DRILL TWIST 2.7 (BIT) IMPLANT
BLADE SAW SGTL 13X75X1.27 (BLADE) ×1 IMPLANT
BLADE SAW THK.89X75X18XSGTL (BLADE) IMPLANT
CANISTER WOUND CARE 500ML ATS (WOUND CARE) IMPLANT
CHLORAPREP W/TINT 26 (MISCELLANEOUS) ×1 IMPLANT
CLSR STERI-STRIP ANTIMIC 1/2X4 (GAUZE/BANDAGES/DRESSINGS) IMPLANT
COOLER ICEMAN CLASSIC (MISCELLANEOUS) ×1 IMPLANT
COVER SURGICAL LIGHT HANDLE (MISCELLANEOUS) ×1 IMPLANT
DIAL VERSA SHOULDER 40 STD (Joint) IMPLANT
DRAPE INCISE IOBAN 66X45 STRL (DRAPES) ×1 IMPLANT
DRAPE U-SHAPE 47X51 STRL (DRAPES) ×2 IMPLANT
DRESSING PEEL AND PLAC PRVNA20 (GAUZE/BANDAGES/DRESSINGS) IMPLANT
DRILL COMPR VRS 2.7 (DRILL) ×2 IMPLANT
DRSG AQUACEL AG ADV 3.5X10 (GAUZE/BANDAGES/DRESSINGS) ×1 IMPLANT
DRSG PEEL AND PLACE PREVENA 20 (GAUZE/BANDAGES/DRESSINGS) ×1 IMPLANT
ELECT BLADE 4.0 EZ CLEAN MEGAD (MISCELLANEOUS) ×2 IMPLANT
ELECT REM PT RETURN 9FT ADLT (ELECTROSURGICAL) ×1 IMPLANT
ELECTRODE BLDE 4.0 EZ CLN MEGD (MISCELLANEOUS) ×1 IMPLANT
ELECTRODE REM PT RTRN 9FT ADLT (ELECTROSURGICAL) ×1 IMPLANT
GAUZE SPONGE 4X4 12PLY STRL LF (GAUZE/BANDAGES/DRESSINGS) ×1 IMPLANT
GLOVE BIOGEL PI IND STRL 6.5 (GLOVE) ×1 IMPLANT
GLOVE BIOGEL PI IND STRL 8 (GLOVE) ×1 IMPLANT
GLOVE ECLIPSE 6.5 STRL STRAW (GLOVE) ×1 IMPLANT
GLOVE ECLIPSE 8.0 STRL XLNG CF (GLOVE) ×1 IMPLANT
GOWN STRL REUS W/ TWL LRG LVL3 (GOWN DISPOSABLE) ×1 IMPLANT
GOWN STRL REUS W/ TWL XL LVL3 (GOWN DISPOSABLE) ×1 IMPLANT
HYDROGEN PEROXIDE 16OZ (MISCELLANEOUS) ×1 IMPLANT
IMPL GLENOID FIX CUSTOM (Orthopedic Implant) IMPLANT
IMPLANT GLENOID FIX CUSTOM (Orthopedic Implant) ×1 IMPLANT
INST ROD SCREW-IN VERSION (INSTRUMENTS) ×1 IMPLANT
INSTRUMENT ROD SCREW-IN VERSIN (INSTRUMENTS) IMPLANT
JET LAVAGE IRRISEPT WOUND (IRRIGATION / IRRIGATOR) ×2 IMPLANT
KIT BASIN OR (CUSTOM PROCEDURE TRAY) ×1 IMPLANT
KIT TURNOVER KIT B (KITS) ×1 IMPLANT
LAVAGE JET IRRISEPT WOUND (IRRIGATION / IRRIGATOR) ×1 IMPLANT
MANIFOLD NEPTUNE II (INSTRUMENTS) ×1 IMPLANT
MODEL BONE GLENOID VRS (ORTHOPEDIC DISPOSABLE SUPPLIES) ×1
MODEL IMPL BONE COMPR VRS (ORTHOPEDIC DISPOSABLE SUPPLIES) IMPLANT
NDL SUT 6 .5 CRC .975X.05 MAYO (NEEDLE) IMPLANT
NS IRRIG 1000ML POUR BTL (IV SOLUTION) ×1 IMPLANT
PACK SHOULDER (CUSTOM PROCEDURE TRAY) ×1 IMPLANT
PAD COLD SHLDR WRAP-ON (PAD) ×1 IMPLANT
PIN THREADED REVERSE (PIN) IMPLANT
PUTTY DBM STAGRAFT PLUS 5CC (Putty) IMPLANT
RESTRAINT HEAD UNIVERSAL NS (MISCELLANEOUS) ×1 IMPLANT
RETRIEVER SUT HEWSON (MISCELLANEOUS) ×1 IMPLANT
SCREW BONE LOCKING 4.75X35X3.5 (Screw) IMPLANT
SCREW BONE LOCKING 4.75X40X3.5 (Screw) IMPLANT
SCREW BONE STRL 6.5X50 STL (Screw) IMPLANT
SCREW NLOCK CORT 4.75X45 (Screw) IMPLANT
SCREW NON-LOCK 4.75X35X3.5 (Screw) IMPLANT
SCREW NON-LOCK 4.75X45X3.5 (Screw) IMPLANT
SLING ARM IMMOBILIZER LRG (SOFTGOODS) ×1 IMPLANT
SOL PREP POV-IOD 4OZ 10% (MISCELLANEOUS) ×1 IMPLANT
SPONGE LAP 18X18 X RAY DECT (DISPOSABLE) IMPLANT
SPONGE T-LAP 18X18 ~~LOC~~+RFID (SPONGE) ×1 IMPLANT
STEM HUMERAL STRL 12MMX140MM (Stem) IMPLANT
STRIP CLOSURE SKIN 1/2X4 (GAUZE/BANDAGES/DRESSINGS) ×1 IMPLANT
SUCTION TUBE FRAZIER 10FR DISP (SUCTIONS) ×1 IMPLANT
SUT MNCRL AB 3-0 PS2 18 (SUTURE) ×1 IMPLANT
SUT SILK 2 0 TIES 10X30 (SUTURE) ×1 IMPLANT
SUT VIC AB 0 CT1 27XBRD ANBCTR (SUTURE) ×4 IMPLANT
SUT VIC AB 1 CT1 27XBRD ANBCTR (SUTURE) ×2 IMPLANT
SUT VIC AB 2-0 CT2 27 (SUTURE) ×3 IMPLANT
SUT VICRYL 0 UR6 27IN ABS (SUTURE) ×2 IMPLANT
TOWEL GREEN STERILE (TOWEL DISPOSABLE) ×1 IMPLANT
TRAY HUM REV SHOULDER STD +6 (Shoulder) IMPLANT
TUBE SUCT ARGYLE STRL (TUBING) IMPLANT
WATER STERILE IRR 1000ML POUR (IV SOLUTION) ×1 IMPLANT

## 2023-07-26 NOTE — Op Note (Signed)
[name], [name] MEDICAL RECORD NO: [id number] ACCOUNT NO: [id number] DATE OF BIRTH: 02/21/1956 FACILITY: [facility] LOCATION: [facility] PHYSICIAN: Graylin Shiver. August Saucer, MD  Operative Report   DATE OF PROCEDURE: 07/26/2023  PREOPERATIVE DIAGNOSIS:  Retained antibiotic spacer, right shoulder, status post removal of primary shoulder replacement implants.  POSTOPERATIVE DIAGNOSIS:  Retained antibiotic spacer, right shoulder, status post removal of primary shoulder replacement implants.  PROCEDURE:  Removal of antibiotic spacer and placement of reverse shoulder replacement, custom implant due to severe glenoid cavitary loss, including VRS custom glenoid baseplate with one central compression screw and four peripheral locking screws with  40 mm standard glenosphere and 12 primary shoulder stem x 122 mm with standard thickness +6 tapered offset mini humeral tray.  SURGEON:  Graylin Shiver. August Saucer, MD  ASSISTANT:  Karenann Cai, PA.  INDICATIONS:  This is a 68 year old patient who is now about 2-1/2 months out from primary shoulder removal and placement of antibiotic spacer.  He presents now for reimplantation.  All cultures negative from original explantation, but the patient did  have significant poly wear as well as a cavitary defect in the glenoid.  DESCRIPTION OF PROCEDURE:  The patient was brought to the operating room where general endotracheal anesthesia was induced.  Preoperative antibiotics measured.  Timeout was called.  Right shoulder, arm and hand prescrubbed with alcohol and Betadine  allowed to air dry, prepped with ChloraPrep solution and draped in a sterile manner.  We did pre-scrub with hydrogen peroxide followed by alcohol and then Betadine and then ChloraPrep solution.  Joseph [name] was used to cover the operative field.  Prior  incision was made after calling timeout.  Skin and subcutaneous tissue was sharply divided.  Cephalic vein not present.  The plane between the pectoralis and deltoid was  developed beginning proximally around the coracoid process and extending distally.   Fairly painstaking to clear out deltoid and subacromial adhesions.  Soft tissue sleeve removed from the anterior part of the shoulder and that was tagged with 0 Vicryl suture.  Soft tissue dissection was performed around the neck of the humerus using a  Cobb elevator.  This was taken around to the 5 o'clock position.  The axillary nerve was protected at all times during the case.  No muscle relaxers were utilized after induction.  At this time, after meticulous clearing up of adhesions and release of  the deltoid from its anterior attachment and removal of adhesions in the subacromial and subdeltoid space, the head was visualized.  Cement extraction was performed by using a combination of osteotomes and saws.  We were able to extract the cement  spacer.  No evidence of infection and no fluid collections around the shoulder.  At this time, some of the greater tuberosity superior aspect required removal because of comminution.  Reaming was performed and broaching performed on the humerus.  Size 12  fit very nicely with good press fit obtained.  The cap was placed and then attention was directed towards the glenoid.  Meticulous dissection again performed circumferentially around the bone remaining around the glenoid.  This was done with  electrocautery as well as a Cobb and the sponges.  In addition, dissection was performed within the glenoid itself to remove fibrous tissue, which had filled in the bony defects.  This fibrous tissue was removed circumferentially.  This also was a  painstaking process in terms of removing all of the soft tissue but leaving the bone in place.  Once this had been performed, we placed in  the trial custom baseplate, which obtained a fairly good fit within the glenoid.  The true baseplate was then  placed and one central compression screw and four peripheral locking screws were placed.  We did place  one central compression screw first and then two nonlocking compression screws at 12 o'clock and 6 o'clock followed by two more locking screws and then  we changed out the non-locking screws for locking screws for maximum stability.  Screw lengths generally matched the preoperative screw lengths.  Bone quality was good on the backside of the scapula for fixation.  Thorough irrigation was performed.   Multiple reduction trials were performed, but the most stable construct with appropriate soft tissue tension was the 40 mm standard glenosphere with no offset matched with 12 humeral stem with a +6 humeral tray and standard liner.  This gave very good  stability to extension and adduction along with internal and external rotation at 90 degrees of abduction.  Difficult reduction and difficult relocation.  The trial components were removed and the true components were placed including the glenosphere  onto a dried Morse taper with good press fit obtained.  We also put in the stem and then the tray onto the dry [facility] taper with same stability parameters maintained.  At this time, 3 liters of pouring irrigation were utilized.  IrriSept solution was  utilized after the incision as well as after the arthrotomy.  It should also be noted that we irrigated out the humeral canal with IrriSept and then placed the vancomycin powder in the canal.  Following pouring irrigation, the deltopectoral interval was  closed.  There was no anterior soft tissue really to close at this time.  Excess cement fragments were also removed with thorough irrigation prior to final closure.  The deltopectoral interval was closed using #1 Vicryl suture followed by interrupted  inverted 0 Vicryl suture, 2-0 Vicryl suture, and 3-0 Monocryl with Steri-Strips and Prevena wound VAC applied.  The patient tolerated the procedure well without immediate complications and was transferred to the recovery room in stable condition.  Luke's  assistance was  required at all times for retraction, opening and closing, mobilization of tissue. His assistance was a medical necessity.   PUS D: 07/26/2023 5:01:00 pm T: 07/26/2023 6:38:00 pm  JOB: [id number]/ [id number]

## 2023-07-26 NOTE — Anesthesia Procedure Notes (Signed)
Anesthesia Regional Block: Interscalene brachial plexus block   Pre-Anesthetic Checklist: , timeout performed,  Correct Patient, Correct Site, Correct Laterality,  Correct Procedure, Correct Position, site marked,  Risks and benefits discussed,  Surgical consent,  Pre-op evaluation,  At surgeon's request and post-op pain management  Laterality: Right  Prep: chloraprep       Needles:  Injection technique: Single-shot  Needle Type: Echogenic Stimulator Needle     Needle Length: 5cm  Needle Gauge: 22     Additional Needles:   Procedures:, nerve stimulator,,,,,     Nerve Stimulator or Paresthesia:  Response: biceps flexion, 0.45 mA  Additional Responses:   Narrative:  Start time: 07/26/2023 7:10 AM End time: 07/26/2023 7:17 AM Injection made incrementally with aspirations every 5 mL.  Performed by: Personally  Anesthesiologist: Achille Rich, MD  Additional Notes: Functioning IV was confirmed and monitors were applied.  A 50mm 22ga Arrow echogenic stimulator needle was used. Sterile prep and drape,hand hygiene and sterile gloves were used.  Negative aspiration and negative test dose prior to incremental administration of local anesthetic. The patient tolerated the procedure well.  Ultrasound guidance: relevent anatomy identified, needle position confirmed, local anesthetic spread visualized around nerve(s), vascular puncture avoided.  Image printed for medical record.

## 2023-07-26 NOTE — Anesthesia Procedure Notes (Signed)
Procedure Name: Intubation Date/Time: 07/26/2023 7:45 AM  Performed by: Thomasene Ripple, CRNAPre-anesthesia Checklist: Patient identified, Emergency Drugs available, Suction available and Patient being monitored Patient Re-evaluated:Patient Re-evaluated prior to induction Oxygen Delivery Method: Circle System Utilized Preoxygenation: Pre-oxygenation with 100% oxygen Induction Type: IV induction Ventilation: Mask ventilation without difficulty, Two handed mask ventilation required and Oral airway inserted - appropriate to patient size Laryngoscope Size: Miller and 3 Grade View: Grade I Tube type: Oral Tube size: 8.0 mm Number of attempts: 1 Airway Equipment and Method: Stylet and Oral airway Placement Confirmation: ETT inserted through vocal cords under direct vision, positive ETCO2 and breath sounds checked- equal and bilateral Secured at: 25 cm Tube secured with: Tape Dental Injury: Teeth and Oropharynx as per pre-operative assessment

## 2023-07-26 NOTE — Anesthesia Preprocedure Evaluation (Signed)
Anesthesia Evaluation  Patient identified by MRN, date of birth, ID band Patient awake    Reviewed: Allergy & Precautions, H&P , NPO status , Patient's Chart, lab work & pertinent test results  History of Anesthesia Complications (+) PONV and history of anesthetic complications  Airway Mallampati: II   Neck ROM: full    Dental   Pulmonary former smoker   breath sounds clear to auscultation       Cardiovascular hypertension,  Rhythm:regular Rate:Normal     Neuro/Psych    GI/Hepatic ,GERD  ,,  Endo/Other    Renal/GU      Musculoskeletal  (+) Arthritis ,    Abdominal   Peds  Hematology   Anesthesia Other Findings   Reproductive/Obstetrics                             Anesthesia Physical Anesthesia Plan  ASA: 2  Anesthesia Plan: General   Post-op Pain Management: Regional block*   Induction: Intravenous  PONV Risk Score and Plan: 3 and Ondansetron, Dexamethasone, Midazolam and Treatment may vary due to age or medical condition  Airway Management Planned: Oral ETT  Additional Equipment:   Intra-op Plan:   Post-operative Plan: Extubation in OR  Informed Consent: I have reviewed the patients History and Physical, chart, labs and discussed the procedure including the risks, benefits and alternatives for the proposed anesthesia with the patient or authorized representative who has indicated his/her understanding and acceptance.     Dental advisory given  Plan Discussed with: CRNA, Anesthesiologist and Surgeon  Anesthesia Plan Comments:        Anesthesia Quick Evaluation

## 2023-07-26 NOTE — Anesthesia Postprocedure Evaluation (Signed)
Anesthesia Post Note  Patient: Joseph [name]  Procedure(s) Performed: RIGHT REVERSE SHOULDER ARTHROPLASTY, REMOVAL ANTIBIOTIC SPACER (Right: Shoulder)     Patient location during evaluation: PACU Anesthesia Type: General and Regional Level of consciousness: awake and alert Pain management: pain level controlled Vital Signs Assessment: post-procedure vital signs reviewed and stable Respiratory status: spontaneous breathing, nonlabored ventilation, respiratory function stable and patient connected to nasal cannula oxygen Cardiovascular status: blood pressure returned to baseline and stable Postop Assessment: no apparent nausea or vomiting Anesthetic complications: no   No notable events documented.  Last Vitals:  Vitals:   07/26/23 1545 07/26/23 1613  BP: 109/88 116/76  Pulse: 80 78  Resp: 18   Temp:  36.7 C  SpO2: 96% 99%    Last Pain:  Vitals:   07/26/23 1445  TempSrc:   PainSc: Asleep                 Concepcion Nation

## 2023-07-26 NOTE — Transfer of Care (Signed)
Immediate Anesthesia Transfer of Care Note  Patient: Joseph [name]  Procedure(s) Performed: RIGHT REVERSE SHOULDER ARTHROPLASTY, REMOVAL ANTIBIOTIC SPACER (Right: Shoulder)  Patient Location: PACU  Anesthesia Type:GA combined with regional for post-op pain  Level of Consciousness: drowsy  Airway & Oxygen Therapy: Patient Spontanous Breathing and Patient connected to face mask oxygen  Post-op Assessment: Report given to RN and Post -op Vital signs reviewed and stable  Post vital signs: Reviewed and stable  Last Vitals:  Vitals Value Taken Time  BP 102/68 07/26/23 1400  Temp    Pulse 68 07/26/23 1401  Resp 17 07/26/23 1401  SpO2 98 % 07/26/23 1401  Vitals shown include unfiled device data.  Last Pain:  Vitals:   07/26/23 0620  TempSrc:   PainSc: 0-No pain         Complications: No notable events documented.

## 2023-07-26 NOTE — H&P (Signed)
Joseph [name] is an 68 y.o. male.   Chief Complaint: right shoulder pain HPI: Joseph [name] is a 68 year old patient who underwent component extraction and antibiotic bead placement for loose right total shoulder replacement approximately 2-1/2 months ago.  Cultures were negative for infection.  Patient did have a significant cavitary defect from glenoid loosening and particle wear.  This asymmetrical cavitary defect required creation of an individualized glenoid component.  That glenoid baseplate component construction has been completed and the patient presents now for revision shoulder replacement.  Past Medical History:  Diagnosis Date   Arthritis    Complication of anesthesia    GERD (gastroesophageal reflux disease)    Heart murmur    as a child, pt states he had an ECHO about 30 years ago (1995) and it was normal   Hypertension    Mixed hyperlipidemia    Osteoarthritis    Pneumonia    PONV (postoperative nausea and vomiting)    Prediabetes    Rheumatic fever    Vertigo     Past Surgical History:  Procedure Laterality Date   BACK SURGERY  2012   JOINT REPLACEMENT Right 2013   right shoulder   SPINE SURGERY  1995   C4-5, C5-C6    SYNOVECTOMY WITH REMOVAL OF TOTAL SHOULDER Right 04/28/2023   Procedure: REMOVAL RIGHT SHOULDER IMPLANTS AND PLACEMENT OF ANTIBIOTIC SPACER;  Surgeon: Cammy Copa, MD;  Location: [facility] OR;  Service: Orthopedics;  Laterality: Right;   TONSILLECTOMY  1963    Family History  Problem Relation Age of Onset   Arthritis Mother        Rheumatoid   Diabetes Mother    Arthritis Father    Hypertension Father    Diabetes Brother    Lupus Brother    Social History:  reports that he quit smoking about 33 years ago. His smoking use included cigarettes. He started smoking about 53 years ago. He has a 30 pack-year smoking history. He has never used smokeless tobacco. He reports that he does not drink alcohol and does not use drugs.  Allergies:  Allergies   Allergen Reactions   Hydrocodone-Acetaminophen Nausea Only and Nausea And Vomiting   Other     Intolerance to narcotic medication - causes stomach upset and dizziness and does not relieve pain.     Medications Prior to Admission  Medication Sig Dispense Refill   amLODipine (NORVASC) 5 MG tablet Take 1 tablet (5 mg total) by mouth daily. 90 tablet 2   atorvastatin (LIPITOR) 40 MG tablet Take 1 tablet (40 mg total) by mouth every evening. 90 tablet 1   Diclofenac Sodium 1.5 % SOLN APPLY TOPICALLY 3 TIMES DAILY AS NEEDED FOR PAIN (Patient taking differently: Apply 1 Application topically in the morning and at bedtime.) 150 mL 0   lidocaine (LIDODERM) 5 % Place 1 patch onto the skin daily. Remove & Discard patch within 12 hours or as directed by MD (Patient taking differently: Place 1 patch onto the skin daily as needed (pain.). Remove & Discard patch within 12 hours or as directed by MD) 30 patch 0   meloxicam (MOBIC) 7.5 MG tablet Take 7.5 mg by mouth every evening.     pantoprazole (PROTONIX) 40 MG tablet Take 1 tablet by mouth twice daily 152 tablet 0    No results found for this or any previous visit (from the past 48 hours). No results found.  Review of Systems  Musculoskeletal:  Positive for arthralgias.  All other systems reviewed and  are negative.   Blood pressure (!) 151/87, pulse 71, temperature 98.1 F (36.7 C), temperature source Oral, resp. rate 17, height 6\' 2"  (1.88 m), weight 99.8 kg, SpO2 98%. Physical Exam Vitals reviewed.  HENT:     Head: Normocephalic.     Nose: Nose normal.     Mouth/Throat:     Mouth: Mucous membranes are moist.  Eyes:     Pupils: Pupils are equal, round, and reactive to light.  Cardiovascular:     Rate and Rhythm: Normal rate.     Pulses: Normal pulses.  Pulmonary:     Effort: Pulmonary effort is normal.  Abdominal:     General: Abdomen is flat.  Musculoskeletal:     Cervical back: Normal range of motion.  Skin:    General: Skin is  warm.     Capillary Refill: Capillary refill takes less than 2 seconds.  Neurological:     General: No focal deficit present.     Mental Status: He is alert.  Psychiatric:        Mood and Affect: Mood normal.   Motor or sensory function to the right hand is intact.  Deltoid is functional.  Incision intact.  Radial pulse intact.  EPL FPL interosseous biceps triceps and deltoid strength intact on that right-hand side.  No lymphadenopathy redness or erythema around the incision.  Assessment/Plan Impression is right shoulder arthritis status post loose glenoid component extraction and placement of antibiotic spacer pending creation of individualized fall reconstruction glenoid baseplate to fill cavitary defect in the glenoid.  That construction of the individualized implant has been completed.  He presents now for implantation.  The risk and benefits are discussed with the patient including not limited to infection nerve and vessel damage incomplete pain relief as well as incomplete restoration of function.  Essentially we are trying to give Iyad a functional shoulder for activities of daily living.  Patient understands the risk and benefits and wishes to proceed.  All questions answered.  Burnard Bunting, MD 07/26/2023, 6:35 AM

## 2023-07-26 NOTE — Brief Op Note (Signed)
07/26/2023  4:51 PM  PATIENT:  Joseph [name]  68 y.o. male  PRE-OPERATIVE DIAGNOSIS: Poly wear from primary total shoulder replacement status post implant removal POST-OPERATIVE DIAGNOSIS: Poly wear from primary total shoulder replacement status post implant removal and placement of antibiotic spacer  PROCEDURE:  Procedure(s): RIGHT REVERSE SHOULDER ARTHROPLASTY, REMOVAL ANTIBIOTIC SPACER (Right)  SURGEON:  Surgeons and Role:    * Cammy Copa, MD - Primary  PHYSICIAN ASSISTANT:   ASSISTANTS: Karenann Cai, PA  ANESTHESIA:   General  EBL:  250 mL   BLOOD ADMINISTERED: None  DRAINS: None  LOCAL MEDICATIONS USED: Vancomycin  SPECIMEN: No specimen  DISPOSITION OF SPECIMEN: No specimen  COUNTS:  YES  TOURNIQUET:  * No tourniquets in log *  DICTATION: Reubin Milan Dictation [phone]  PLAN OF CARE: Admit for overnight observation  PATIENT DISPOSITION:  PACU - hemodynamically stable.   Delay start of Pharmacological VTE agent (>24hrs) due to surgical blood loss or risk of bleeding: Plan aspirin DVT prophylaxis

## 2023-07-27 MED ORDER — HYDROMORPHONE HCL 1 MG/ML IJ SOLN
1.0000 mg | INTRAMUSCULAR | Status: DC | PRN
  Administered 2023-07-27 – 2023-07-28 (×3): 1 mg via INTRAVENOUS
  Filled 2023-07-27 (×3): qty 1

## 2023-07-27 MED ORDER — KETOROLAC TROMETHAMINE 30 MG/ML IJ SOLN
30.0000 mg | Freq: Four times a day (QID) | INTRAMUSCULAR | Status: DC
  Administered 2023-07-27 – 2023-07-28 (×5): 30 mg via INTRAVENOUS
  Filled 2023-07-27 (×5): qty 1

## 2023-07-27 NOTE — Discharge Instructions (Signed)
Social Connections   [facility]Adult Program)  - Address: [address] [place], [place] [address] - General office #:  [phone] - Enrollment Phone #: [phone]  [facility]  - [facility] Line: call toll free, available 24 hours a day, at [phone]   [facility] [address]   [employer] [facility] [employer] and [facility]) Serving ages 81 years & older [address] [place], [place] [address]      [phone]

## 2023-07-27 NOTE — TOC Initial Note (Signed)
Transition of Care [facility]) - Initial/Assessment Note    Patient Details  Name: Joseph [name] MRN: [id number] Date of Birth: 1956-05-19  Transition of Care [facility]) CM/SW Contact:    Marliss Coots, LCSW Phone Number: 07/27/2023, 11:24 AM  Clinical Narrative:                  11:25 AM CSW added SDOH resources (social connections) to AVS.   Barriers to Discharge: Continued Medical Work up   Patient Goals and CMS Choice            Expected Discharge Plan and Services       Living arrangements for the past 2 months: Single Family Home                                      Prior Living Arrangements/Services Living arrangements for the past 2 months: Single Family Home Lives with:: Spouse Patient language and need for interpreter reviewed:: Yes              Criminal Activity/Legal Involvement Pertinent to Current Situation/Hospitalization: No - Comment as needed  Activities of Daily Living   ADL Screening (condition at time of admission) Independently performs ADLs?: Yes (appropriate for developmental age) Is the patient deaf or have difficulty hearing?: No Does the patient have difficulty seeing, even when wearing glasses/contacts?: No Does the patient have difficulty concentrating, remembering, or making decisions?: No  Permission Sought/Granted Permission sought to share information with : Family Supports Permission granted to share information with : No (Contact information on chart)  Share Information with NAME: Steen Bisig     Permission granted to share info w Relationship: Spouse  Permission granted to share info w Contact Information: [phone]  Emotional Assessment       Orientation: : Oriented to Self, Oriented to Place, Oriented to  Time, Oriented to Situation Alcohol / Substance Use: Not Applicable Psych Involvement: No (comment)  Admission diagnosis:  S/P reverse total shoulder arthroplasty, right [Z96.611] Patient Active Problem  List   Diagnosis Date Noted   S/P reverse total shoulder arthroplasty, right 07/26/2023   Medication management 05/16/2023   PICC (peripherally inserted central catheter) in place 05/16/2023   Prosthetic joint infection (HCC) 04/29/2023   OA (osteoarthritis) of shoulder 04/28/2023   S/P hardware removal 04/28/2023   Umbilical hernia without obstruction and without gangrene 07/05/2022   Insulin resistance 09/03/2019   Cubital tunnel syndrome 07/16/2019   Localized, primary osteoarthritis of shoulder region 07/16/2019   Shoulder pain 07/16/2019   Low testosterone 04/13/2018   Mixed hyperlipidemia    Bilateral sensorineural hearing loss 11/01/2017   Subjective tinnitus of right ear 11/01/2017   Hyperlipidemia 09/14/2017   History of tobacco abuse 09/14/2017   Overweight (BMI 25.0-29.9) 09/14/2017   Vertigo 07/14/2016   Arthritis of hand 02/09/2016   Right hand pain 02/09/2016   Cervical spondylosis with myelopathy 01/15/2016   Lumbar facet arthropathy 12/12/2015   Lumbar stenosis 12/12/2015   Meralgia paresthetica 12/12/2015   Neurofibroma 12/12/2015   Other intervertebral disc degeneration, lumbar region 12/12/2015   Dysphagia 10/31/2015   GERD (gastroesophageal reflux disease) 10/31/2015   Neuropathy 10/21/2015   Essential hypertension 02/11/2015   Shingles 07/10/2014   Low back pain 05/01/2014   Kidney stone 05/01/2014   PCP:  Sharlene Dory, DO Pharmacy:   [id number] - [place], [place] - [address]  [facility] [place] [address] Phone: [phone] Fax: [phone]     Social Drivers of Health (SDOH) Social History: SDOH Screenings   Food Insecurity: No Food Insecurity (07/27/2023)  Housing: Low Risk  (07/27/2023)  Transportation Needs: No Transportation Needs (07/27/2023)  Utilities: Not At Risk (07/27/2023)  Depression (PHQ2-9): Low Risk  (05/21/2022)  Social Connections: Socially Isolated (07/27/2023)  Tobacco Use: Medium  Risk (07/26/2023)   SDOH Interventions:     Readmission Risk Interventions     No data to display

## 2023-07-27 NOTE — Plan of Care (Signed)
Problem: Education: Goal: Knowledge of General Education information will improve Description: Including pain rating scale, medication(s)/side effects and non-pharmacologic comfort measures Outcome: Progressing   Problem: Health Behavior/Discharge Planning: Goal: Ability to manage health-related needs will improve Outcome: Progressing   Problem: Clinical Measurements: Goal: Ability to maintain clinical measurements within normal limits will improve Outcome: Progressing Goal: Will remain free from infection Outcome: Progressing Goal: Diagnostic test results will improve Outcome: Progressing Goal: Respiratory complications will improve Outcome: Progressing Goal: Cardiovascular complication will be avoided Outcome: Progressing   Problem: Activity: Goal: Risk for activity intolerance will decrease Outcome: Progressing   Problem: Nutrition: Goal: Adequate nutrition will be maintained Outcome: Progressing   Problem: Coping: Goal: Level of anxiety will decrease Outcome: Progressing   Problem: Elimination: Goal: Will not experience complications related to bowel motility Outcome: Progressing Goal: Will not experience complications related to urinary retention Outcome: Progressing   Problem: Pain Managment: Goal: General experience of comfort will improve and/or be controlled Outcome: Progressing   Problem: Safety: Goal: Ability to remain free from injury will improve Outcome: Progressing   Problem: Skin Integrity: Goal: Risk for impaired skin integrity will decrease Outcome: Progressing   Problem: Education: Goal: Knowledge of the prescribed therapeutic regimen will improve Outcome: Progressing Goal: Understanding of activity limitations/precautions following surgery will improve Outcome: Progressing Goal: Individualized Educational Video(s) Outcome: Progressing   Problem: Activity: Goal: Ability to tolerate increased activity will improve Outcome: Progressing    Problem: Pain Management: Goal: Pain level will decrease with appropriate interventions Outcome: Progressing

## 2023-07-27 NOTE — Evaluation (Signed)
Occupational Therapy Evaluation Patient Details Name: Joseph [name] MRN: [id number] DOB: [birth year] Today's Date: 07/27/2023   History of Present Illness   History of Present Illness: 68 year old patient who underwent component extraction and antibiotic bead placement for loose right total shoulder replacement Nov. 2024 now s/p right RSA conversion from cemented spacer.PMH: arthritis, GERD, HTN, OA, vertigo.     Clinical Impressions s/p shoulder replacement without functional use of RUE secondary to effects of surgery and interscalene block and shoulder precautions. Therapist provided education and instruction to patient and spouse in regards to exercises, precautions, positioning, donning upper extremity clothing and bathing while maintaining shoulder precautions, ice and edema management, use of ice machine and donning/doffing sling. Patient and spouse verbalized understanding and demonstrated as needed. Pt recently underwent Right shoulder surgery 11/24 to place spacer and verbalizes no difficulty following precautions and exercises from initial surgery. Patient to follow up with MD for further therapy needs. Due to recent administration of pain medication, pt was not ambulated during OT evaluation. Will follow up with pt this afternoon to assess balance if he has not done so with nursing or spouse.       If plan is discharge home, recommend the following:   A little help with walking and/or transfers;A lot of help with bathing/dressing/bathroom;Help with stairs or ramp for entrance;Assist for transportation     Functional Status Assessment   Patient has had a recent decline in their functional status and demonstrates the ability to make significant improvements in function in a reasonable and predictable amount of time.     Equipment Recommendations   None recommended by OT      Precautions/Restrictions   Precautions Precautions: Shoulder Type of Shoulder Precautions:  post op R shoulder RSA conversion- No ROM Shoulder, OK A/ROM elbow, wrist, and hand, sling 24/7, WB restrictions. Shoulder Interventions: Shoulder sling/immobilizer;At all times;Off for dressing/bathing/exercises Precaution Booklet Issued: Yes (comment) Recall of Precautions/Restrictions: Intact Precaution/Restrictions Comments: verbal education and handout provided to pt and spouse regarding shoulder precautions/restrictions post op. Both able to verbalize understanding. Restrictions Weight Bearing Restrictions Per Provider Order: Yes RUE Weight Bearing Per Provider Order: Non weight bearing     Mobility Bed Mobility    General bed mobility comments: deferred at this time due to recent administration of diludid pain medication.    Transfers  General transfer comment: deferred at this time due to recent administration of diludid pain medication.      Balance Overall balance assessment:  (Not assess at evaluation. Will follow up this afternoon)      ADL either performed or assessed with clinical judgement   ADL      General ADL Comments: Due to recent shoulder surgery, pt will require Min-Max A to complete BADL tasks such as grooming, bathing, dressing, and toileting which his spouse is able to assist with.     Vision Baseline Vision/History: 0 No visual deficits Ability to See in Adequate Light: 0 Adequate Patient Visual Report: No change from baseline Vision Assessment?: No apparent visual deficits     Perception Perception: Not tested       Praxis Praxis: Not tested       Pertinent Vitals/Pain Pain Assessment Pain Assessment: 0-10 Pain Score: 5  Pain Location: Right shoulder Pain Descriptors / Indicators: Aching, Constant, Grimacing, Discomfort Pain Intervention(s): Limited activity within patient's tolerance, Monitored during session, Premedicated before session, Ice applied, Repositioned     Extremity/Trunk Assessment Upper Extremity Assessment Upper  Extremity Assessment: RUE deficits/detail;Right hand dominant RUE Deficits /  Details: Post shoulder RSA conversion with restrictions in place. Able to demonstrate functional wrist and hand A/ROM. Nerve block as worn off. RUE: Unable to fully assess due to immobilization RUE Coordination: decreased fine motor;decreased gross motor   Lower Extremity Assessment Lower Extremity Assessment: Overall WFL for tasks assessed   Cervical / Trunk Assessment Cervical / Trunk Assessment: Normal   Communication Communication Communication: No apparent difficulties   Cognition Arousal: Alert Behavior During Therapy: WFL for tasks assessed/performed Cognition: No apparent impairments    Following commands: Intact             Shoulder Instructions Shoulder Instructions Donning/doffing shirt without moving shoulder: Caregiver independent with task Method for sponge bathing under operated UE: Caregiver independent with task Donning/doffing sling/immobilizer: Caregiver independent with task Correct positioning of sling/immobilizer: Caregiver independent with task ROM for elbow, wrist and digits of operated UE: Independent;Caregiver independent with task;Patient able to independently direct caregiver Sling wearing schedule (on at all times/off for ADL's): Caregiver independent with task Proper positioning of operated UE when showering: Caregiver independent with task Positioning of UE while sleeping: Caregiver independent with task    Home Living Family/patient expects to be discharged to:: Private residence Living Arrangements: Spouse/significant other Available Help at Discharge: Family;Available 24 hours/day Type of Home: House Home Access: Level entry     Home Layout: One level     Bathroom Shower/Tub: Producer, television/film/video: Handicapped height     Home Equipment: Shower seat;Hand held shower head          Prior Functioning/Environment Prior Level of Function :  Independent/Modified Independent;Driving     OT Problem List: Impaired UE functional use        OT Goals(Current goals can be found in the care plan section)   Acute Rehab OT Goals Patient Stated Goal: to decrease pain OT Goal Formulation: With patient Time For Goal Achievement: 07/27/23 Potential to Achieve Goals: Good   OT Frequency:   1X/ week       AM-PAC OT "6 Clicks" Daily Activity     Outcome Measure Help from another person eating meals?: A Little Help from another person taking care of personal grooming?: A Little Help from another person toileting, which includes using toliet, bedpan, or urinal?: A Lot Help from another person bathing (including washing, rinsing, drying)?: A Lot Help from another person to put on and taking off regular upper body clothing?: Total Help from another person to put on and taking off regular lower body clothing?: Total 6 Click Score: 12   End of Session Equipment Utilized During Treatment: Other (comment) (sling)  Activity Tolerance: Patient tolerated treatment well;Patient limited by pain Patient left: in bed;with call bell/phone within reach;with family/visitor present  OT Visit Diagnosis: Muscle weakness (generalized) (M62.81);Pain Pain - Right/Left: Right Pain - part of body: Shoulder                Time: 1610-9604 OT Time Calculation (min): 11 min Charges:  OT General Charges $OT Visit: 1 Visit OT Evaluation $OT Eval Moderate Complexity: 1 Mod  [employer], OTR/L,CBIS  Supplemental OT - [facility] Secure Chat Preferred    Ronella Plunk, Charisse March 07/27/2023, 9:39 AM

## 2023-07-27 NOTE — Progress Notes (Signed)
Occupational Therapy Treatment/Discharge Patient Details Name: Joseph [name] MRN: [id number] DOB: May 15, 1956 Today's Date: 07/27/2023   History of present illness 68 year old patient who underwent component extraction and antibiotic bead placement for loose right total shoulder replacement Nov. 2024 now s/p right RSA conversion from cemented spacer.PMH: arthritis, GERD, HTN, OA, vertigo.   OT comments  Pt sitting up on EOB with spouse in room upon therapy arrival. Session focused on  functional mobility and balance while pt navigated up/down unit hallway with assist for line management. Pt continues to verbalize a high pain level. Nursing arrived in room during session and is aware. Education provided on pain management techniques and pain level expectations post op. Pt verbalized understanding. All therapy goals have been met and no further acute OT needs identified at this time. Will sign off.       If plan is discharge home, recommend the following:  A little help with walking and/or transfers;A lot of help with bathing/dressing/bathroom;Help with stairs or ramp for entrance;Assist for transportation   Equipment Recommendations  None recommended by OT       Precautions / Restrictions Precautions Precautions: Shoulder Type of Shoulder Precautions: post op R shoulder RSA conversion- No ROM Shoulder, OK A/ROM elbow, wrist, and hand, sling 24/7, WB restrictions. Shoulder Interventions: Shoulder sling/immobilizer;At all times;Off for dressing/bathing/exercises Precaution Booklet Issued: No (provided at evaluation this morning) Recall of Precautions/Restrictions: Intact Precaution/Restrictions Comments: verbal education and handout provided to pt and spouse regarding shoulder precautions/restrictions post op. Both able to verbalize understanding. Restrictions Weight Bearing Restrictions Per Provider Order: Yes RUE Weight Bearing Per Provider Order: Non weight bearing       Mobility Bed  Mobility Overal bed mobility: Needs Assistance Bed Mobility: Sit to Supine       Sit to supine: Supervision   General bed mobility comments: No physical assist needed. Provided line management only.    Transfers Overall transfer level: Needs assistance Equipment used: None Transfers: Sit to/from Stand, Bed to chair/wheelchair/BSC Sit to Stand: Supervision     Step pivot transfers: Supervision     General transfer comment: Required assistance with line management only (IV and wound vac)     Balance Overall balance assessment: No apparent balance deficits (not formally assessed)        ADL either performed or assessed with clinical judgement              Communication Communication Communication: No apparent difficulties   Cognition Arousal: Alert Behavior During Therapy: WFL for tasks assessed/performed Cognition: No apparent impairments    Following commands: Intact                 General Comments Pt and family education provided on expected pain post surgery and the likelihood of it being higher the first 48 hours after. Discussed use of pain medication, proper positioning, and ice for pain management and edema control. Pt verbalized understanding.    Pertinent Vitals/ Pain       Pain Assessment Pain Assessment: 0-10 Pain Score: 6  Pain Location: Right shoulder Pain Descriptors / Indicators: Aching, Constant, Grimacing, Discomfort, Operative site guarding Pain Intervention(s): Limited activity within patient's tolerance, Monitored during session, Repositioned, Patient requesting pain meds-RN notified            Progress Toward Goals  OT Goals(current goals can now be found in the care plan section)  Progress towards OT goals: Goals met/education completed, patient discharged from OT  Acute Rehab OT Goals Patient Stated Goal: to decrease pain  OT Goal Formulation: With patient Time For Goal Achievement: 07/27/23 Potential to Achieve Goals:  Good ADL Goals Additional ADL Goal #1: Pt will demonstrate ability to navigate within room/bathroom with staff and/or spouse assisting while maintaining post op shoulder instructions with assist as needed.  Plan         AM-PAC OT "6 Clicks" Daily Activity     Outcome Measure   Help from another person eating meals?: A Little Help from another person taking care of personal grooming?: A Little Help from another person toileting, which includes using toliet, bedpan, or urinal?: A Lot Help from another person bathing (including washing, rinsing, drying)?: A Lot Help from another person to put on and taking off regular upper body clothing?: Total Help from another person to put on and taking off regular lower body clothing?: Total 6 Click Score: 12    End of Session Equipment Utilized During Treatment: Other (comment) (sling)  OT Visit Diagnosis: Muscle weakness (generalized) (M62.81);Pain Pain - Right/Left: Right Pain - part of body: Shoulder   Activity Tolerance Patient tolerated treatment well;Patient limited by pain   Patient Left in bed;with call bell/phone within reach;with family/visitor present   Nurse Communication Patient requests pain meds        Time: 1202-1222 OT Time Calculation (min): 20 min  Charges: OT General Charges $OT Visit: 1 Visit OT Evaluation $OT Eval Moderate Complexity: 1 Mod OT Treatments $Therapeutic Activity: 8-22 mins  Joseph [name], OTR/L,CBIS  Supplemental OT - [facility] Secure Chat Preferred    Joseph [name], Charisse March 07/27/2023, 12:41 PM

## 2023-07-27 NOTE — Progress Notes (Signed)
  Subjective: Joseph [name] is a 68 y.o. male s/p right RSA conversion from cemented spacer.  They are POD 1.  Pt's pain is moderate but controlled.  Block is worn off..  Patient denies any complaints of chest pain, shortness of breath, abdominal pain.  Has not ambulated yet.  No dizziness or lightheadedness when he gets to the edge of the bed.  Objective: Vital signs in last 24 hours: Temp:  [94 F (34.4 C)-98.3 F (36.8 C)] 98.2 F (36.8 C) (02/12 0412) Pulse Rate:  [61-89] 82 (02/12 0412) Resp:  [12-18] 18 (02/12 0412) BP: (102-135)/(68-94) 112/73 (02/12 0412) SpO2:  [90 %-99 %] 94 % (02/12 0412)  Intake/Output from previous day: 02/11 0701 - 02/12 0700 In: 1100 [I.V.:1000; IV Piggyback:100] Out: 1180 [Urine:930; Blood:250] Intake/Output this shift: No intake/output data recorded.  Exam:  No gross blood or drainage overlying the dressing.  There is no drainage in the wound VAC canister 2+ radial pulse of the operative extremity Postoperative physical exam somewhat limited by interscalene block but intact EPL, FPL, finger abduction, pronation/supination, bicep flexion, axillary nerve function with firing deltoid   Labs: No results for input(s): "HGB" in the last 72 hours. No results for input(s): "WBC", "RBC", "HCT", "PLT" in the last 72 hours. No results for input(s): "NA", "K", "CL", "CO2", "BUN", "CREATININE", "GLUCOSE", "CALCIUM" in the last 72 hours. No results for input(s): "LABPT", "INR" in the last 72 hours.  Assessment/Plan: Pt is POD 1 s/p right RSA    -Plan to discharge to home likely today pending patient's pain and ability to ambulate.  He will try some ambulation around his room today and work with occupational therapy.  If he feels he is able to ambulate without any dizziness or lightheadedness, and pain is controlled, plan for discharge home.  He will need portable Prevena wound VAC prior to discharge.  -No lifting with the operative arm  -No range motion of  the operative shoulder but he is okay for full active and passive range of motion of the right elbow/hand/wrist.  -Follow-up with Dr. August Saucer or myself in clinic 2 weeks postoperatively     [facility] 07/27/2023, 8:39 AM

## 2023-07-27 NOTE — Care Management Obs Status (Signed)
MEDICARE OBSERVATION STATUS NOTIFICATION   Patient Details  Name: Joseph [name] MRN: [id number] Date of Birth: 08/26/1955   Medicare Observation Status Notification Given:  Yes    Lawerance Sabal, RN 07/27/2023, 12:03 PM

## 2023-07-28 ENCOUNTER — Other Ambulatory Visit: Payer: Self-pay | Admitting: Family Medicine

## 2023-07-28 ENCOUNTER — Telehealth: Payer: Self-pay

## 2023-07-28 DIAGNOSIS — M24111 Other articular cartilage disorders, right shoulder: Secondary | ICD-10-CM | POA: Diagnosis present

## 2023-07-28 DIAGNOSIS — Z8261 Family history of arthritis: Secondary | ICD-10-CM | POA: Diagnosis not present

## 2023-07-28 DIAGNOSIS — Z791 Long term (current) use of non-steroidal anti-inflammatories (NSAID): Secondary | ICD-10-CM | POA: Diagnosis not present

## 2023-07-28 DIAGNOSIS — Z96611 Presence of right artificial shoulder joint: Secondary | ICD-10-CM

## 2023-07-28 DIAGNOSIS — Z4731 Aftercare following explantation of shoulder joint prosthesis: Secondary | ICD-10-CM | POA: Diagnosis present

## 2023-07-28 DIAGNOSIS — E782 Mixed hyperlipidemia: Secondary | ICD-10-CM | POA: Diagnosis present

## 2023-07-28 DIAGNOSIS — M7501 Adhesive capsulitis of right shoulder: Secondary | ICD-10-CM | POA: Diagnosis present

## 2023-07-28 DIAGNOSIS — Z8269 Family history of other diseases of the musculoskeletal system and connective tissue: Secondary | ICD-10-CM | POA: Diagnosis not present

## 2023-07-28 DIAGNOSIS — T84038D Mechanical loosening of other internal prosthetic joint, subsequent encounter: Secondary | ICD-10-CM

## 2023-07-28 DIAGNOSIS — I10 Essential (primary) hypertension: Secondary | ICD-10-CM | POA: Diagnosis present

## 2023-07-28 DIAGNOSIS — Z79899 Other long term (current) drug therapy: Secondary | ICD-10-CM | POA: Diagnosis not present

## 2023-07-28 DIAGNOSIS — Z87891 Personal history of nicotine dependence: Secondary | ICD-10-CM | POA: Diagnosis not present

## 2023-07-28 DIAGNOSIS — T84038A Mechanical loosening of other internal prosthetic joint, initial encounter: Secondary | ICD-10-CM | POA: Diagnosis not present

## 2023-07-28 DIAGNOSIS — Z833 Family history of diabetes mellitus: Secondary | ICD-10-CM | POA: Diagnosis not present

## 2023-07-28 DIAGNOSIS — K219 Gastro-esophageal reflux disease without esophagitis: Secondary | ICD-10-CM | POA: Diagnosis present

## 2023-07-28 DIAGNOSIS — Z8249 Family history of ischemic heart disease and other diseases of the circulatory system: Secondary | ICD-10-CM | POA: Diagnosis not present

## 2023-07-28 DIAGNOSIS — Z885 Allergy status to narcotic agent status: Secondary | ICD-10-CM | POA: Diagnosis not present

## 2023-07-28 MED ORDER — METHOCARBAMOL 500 MG PO TABS: 500.0000 mg | ORAL_TABLET | Freq: Three times a day (TID) | ORAL | 0 refills | Status: DC | PRN

## 2023-07-28 MED ORDER — ASPIRIN 81 MG PO TBEC: 81.0000 mg | DELAYED_RELEASE_TABLET | Freq: Every day | ORAL | 0 refills | Status: DC

## 2023-07-28 MED ORDER — DOCUSATE SODIUM 100 MG PO CAPS: 100.0000 mg | ORAL_CAPSULE | Freq: Two times a day (BID) | ORAL | 0 refills | Status: DC

## 2023-07-28 MED ORDER — HYDROMORPHONE HCL 2 MG PO TABS: 1.0000 mg | ORAL_TABLET | ORAL | 0 refills | Status: DC | PRN

## 2023-07-28 NOTE — Progress Notes (Signed)
  Subjective: Joseph [name] is a 68 y.o. male s/p right RSA conversion from cemented spacer.  They are POD 2.  Patient's pain is much improved compared with yesterday evening.  He has been ambulatory around his room without any lightheadedness or dizziness.  Still no chest pain or shortness of breath or fever/chills.  Feels ready for discharge home.  Objective: Vital signs in last 24 hours: Temp:  [97.9 F (36.6 C)-98 F (36.7 C)] 97.9 F (36.6 C) (02/13 0459) Pulse Rate:  [62-64] 63 (02/13 0459) Resp:  [16-17] 16 (02/13 0459) BP: (115-143)/(73-81) 143/81 (02/13 0459) SpO2:  [90 %-96 %] 90 % (02/13 0459)  Intake/Output from previous day: No intake/output data recorded. Intake/Output this shift: No intake/output data recorded.  Exam:  No gross blood or drainage overlying the dressing.  There is no drainage in the wound VAC canister 2+ radial pulse of the operative extremity  intact EPL, FPL, finger abduction, pronation/supination, bicep flexion, axillary nerve function with firing deltoid.  No change compared with yesterday's exam   Labs: No results for input(s): "HGB" in the last 72 hours. No results for input(s): "WBC", "RBC", "HCT", "PLT" in the last 72 hours. No results for input(s): "NA", "K", "CL", "CO2", "BUN", "CREATININE", "GLUCOSE", "CALCIUM" in the last 72 hours. No results for input(s): "LABPT", "INR" in the last 72 hours.  Assessment/Plan: Pt is POD 2 s/p right RSA    -Plan to discharge to home today now that pain is much better controlled.  He will need portable Prevena wound VAC prior to discharge.  -No lifting with the operative arm  -No range motion of the operative shoulder but he is okay for full active and passive range of motion of the right elbow/hand/wrist.  -Follow-up with Dr. August Saucer or myself in clinic 2 weeks postoperatively.  Patient was instructed to reach out to the office after the Prevena wound VAC runs out of power which takes about a week.   Anticipate this will be next Friday and then he can, and we can replace this with Aquacel dressing.     Luke Shivangi Lutz 07/28/2023, 11:03 AM

## 2023-07-28 NOTE — Telephone Encounter (Signed)
Dr August Saucer wants patient worked in to remove wound vac next Wednesday. Can you please call him and get him worked in with Dean/Luke on that day?

## 2023-07-28 NOTE — Plan of Care (Signed)
Problem: Education: Goal: Knowledge of General Education information will improve Description: Including pain rating scale, medication(s)/side effects and non-pharmacologic comfort measures Outcome: Progressing   Problem: Health Behavior/Discharge Planning: Goal: Ability to manage health-related needs will improve Outcome: Progressing   Problem: Clinical Measurements: Goal: Ability to maintain clinical measurements within normal limits will improve Outcome: Progressing Goal: Will remain free from infection Outcome: Progressing Goal: Diagnostic test results will improve Outcome: Progressing Goal: Respiratory complications will improve Outcome: Progressing Goal: Cardiovascular complication will be avoided Outcome: Progressing   Problem: Activity: Goal: Risk for activity intolerance will decrease Outcome: Progressing   Problem: Nutrition: Goal: Adequate nutrition will be maintained Outcome: Progressing   Problem: Coping: Goal: Level of anxiety will decrease Outcome: Progressing   Problem: Elimination: Goal: Will not experience complications related to bowel motility Outcome: Progressing Goal: Will not experience complications related to urinary retention Outcome: Progressing   Problem: Pain Managment: Goal: General experience of comfort will improve and/or be controlled Outcome: Progressing   Problem: Safety: Goal: Ability to remain free from injury will improve Outcome: Progressing   Problem: Skin Integrity: Goal: Risk for impaired skin integrity will decrease Outcome: Progressing   Problem: Education: Goal: Knowledge of the prescribed therapeutic regimen will improve Outcome: Progressing Goal: Understanding of activity limitations/precautions following surgery will improve Outcome: Progressing Goal: Individualized Educational Video(s) Outcome: Progressing   Problem: Activity: Goal: Ability to tolerate increased activity will improve Outcome: Progressing    Problem: Pain Management: Goal: Pain level will decrease with appropriate interventions Outcome: Progressing

## 2023-07-29 ENCOUNTER — Encounter (HOSPITAL_COMMUNITY): Payer: Self-pay | Admitting: Orthopedic Surgery

## 2023-07-31 DIAGNOSIS — T84038A Mechanical loosening of other internal prosthetic joint, initial encounter: Secondary | ICD-10-CM

## 2023-07-31 DIAGNOSIS — Z96619 Presence of unspecified artificial shoulder joint: Secondary | ICD-10-CM

## 2023-08-01 ENCOUNTER — Telehealth: Payer: Self-pay | Admitting: Orthopedic Surgery
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
## 2023-08-01 NOTE — Telephone Encounter (Signed)
 IC spoke with patient. He will keep appt for Wednesday.

## 2023-08-03 ENCOUNTER — Telehealth: Payer: Self-pay

## 2023-08-03 ENCOUNTER — Ambulatory Visit: Payer: Medicare Other | Admitting: Orthopedic Surgery

## 2023-08-03 ENCOUNTER — Other Ambulatory Visit (INDEPENDENT_AMBULATORY_CARE_PROVIDER_SITE_OTHER): Payer: Medicare Other

## 2023-08-03 ENCOUNTER — Encounter: Payer: Self-pay | Admitting: Orthopedic Surgery

## 2023-08-03 DIAGNOSIS — Z96611 Presence of right artificial shoulder joint: Secondary | ICD-10-CM

## 2023-08-03 NOTE — Progress Notes (Signed)
 Post-Op Visit Note   Patient: Joseph [name]           Date of Birth: 1955-10-30           MRN: [id number] Visit Date: 08/03/2023 PCP: Sharlene Dory, DO   Assessment & Plan:  Chief Complaint:  Chief Complaint  Patient presents with   Right Shoulder - Routine Post Op   Visit Diagnoses:  1. History of arthroplasty of right shoulder     Plan: Patient is a 68 year old male who presents for evaluation of right shoulder following right reverse shoulder arthroplasty with vault reconstruction system and removal of antibiotic spacer on 07/26/2023.  He is doing well with pain overall controlled.  He is here today for wound VAC removal and placement of Aquacel dressing.  Incision was inspected with no evidence of infection or dehiscence.  There are multiple nylon sutures in place that will need to be removed at his next appointment next week.  We will remove the Aquacel dressing at that point, remove the nylon sutures and then apply Steri-Strips.  There is a area of blistering in the anterior aspect of the axilla.  We will use Bactroban cream over top of this 2-3 times daily.  No fevers or chills.  We will set him up for CPM machine to start on Monday.  Follow-Up Instructions: Return in about 1 week (around 08/10/2023).   Orders:  Orders Placed This Encounter  Procedures   XR Shoulder Right   No orders of the defined types were placed in this encounter.   Imaging: XR Shoulder Right Result Date: 08/03/2023 AP, Scap Y views of right shoulder reviewed.  Vault reconstruction reverse shoulder replacement implant looks to be in good position without complication.  No periprosthetic fracture.  No dislocation.   PMFS History: Patient Active Problem List   Diagnosis Date Noted   Loosening of total shoulder replacement (HCC) 07/31/2023   S/P reverse total shoulder arthroplasty, right 07/26/2023   Medication management 05/16/2023   PICC (peripherally inserted central catheter) in  place 05/16/2023   Prosthetic joint infection (HCC) 04/29/2023   OA (osteoarthritis) of shoulder 04/28/2023   S/P hardware removal 04/28/2023   Umbilical hernia without obstruction and without gangrene 07/05/2022   Insulin resistance 09/03/2019   Cubital tunnel syndrome 07/16/2019   Localized, primary osteoarthritis of shoulder region 07/16/2019   Shoulder pain 07/16/2019   Low testosterone 04/13/2018   Mixed hyperlipidemia    Bilateral sensorineural hearing loss 11/01/2017   Subjective tinnitus of right ear 11/01/2017   Hyperlipidemia 09/14/2017   History of tobacco abuse 09/14/2017   Overweight (BMI 25.0-29.9) 09/14/2017   Vertigo 07/14/2016   Arthritis of hand 02/09/2016   Right hand pain 02/09/2016   Cervical spondylosis with myelopathy 01/15/2016   Lumbar facet arthropathy 12/12/2015   Lumbar stenosis 12/12/2015   Meralgia paresthetica 12/12/2015   Neurofibroma 12/12/2015   Other intervertebral disc degeneration, lumbar region 12/12/2015   Dysphagia 10/31/2015   GERD (gastroesophageal reflux disease) 10/31/2015   Neuropathy 10/21/2015   Essential hypertension 02/11/2015   Shingles 07/10/2014   Low back pain 05/01/2014   Kidney stone 05/01/2014   Past Medical History:  Diagnosis Date   Arthritis    Complication of anesthesia    GERD (gastroesophageal reflux disease)    Heart murmur    as a child, pt states he had an ECHO about 30 years ago (1995) and it was normal   Hypertension    Mixed hyperlipidemia    Osteoarthritis  Pneumonia    PONV (postoperative nausea and vomiting)    Prediabetes    Rheumatic fever    Vertigo     Family History  Problem Relation Age of Onset   Arthritis Mother        Rheumatoid   Diabetes Mother    Arthritis Father    Hypertension Father    Diabetes Brother    Lupus Brother     Past Surgical History:  Procedure Laterality Date   BACK SURGERY  2012   JOINT REPLACEMENT Right 2013   right shoulder   SPINE SURGERY  1995    C4-5, C5-C6    SYNOVECTOMY WITH REMOVAL OF TOTAL SHOULDER Right 04/28/2023   Procedure: REMOVAL RIGHT SHOULDER IMPLANTS AND PLACEMENT OF ANTIBIOTIC SPACER;  Surgeon: Cammy Copa, MD;  Location: [facility] OR;  Service: Orthopedics;  Laterality: Right;   TONSILLECTOMY  1963   TOTAL SHOULDER REVISION Right 07/26/2023   Procedure: RIGHT REVERSE SHOULDER ARTHROPLASTY, REMOVAL ANTIBIOTIC SPACER;  Surgeon: Cammy Copa, MD;  Location: [facility] OR;  Service: Orthopedics;  Laterality: Right;   Social History   Occupational History   Occupation: Disability    Comment: Shoulder / Back  Tobacco Use   Smoking status: Former    Current packs/day: 0.00    Average packs/day: 1.5 packs/day for 20.0 years (30.0 ttl pk-yrs)    Types: Cigarettes    Start date: 05/01/1970    Quit date: 05/01/1990    Years since quitting: 33.2   Smokeless tobacco: Never  Vaping Use   Vaping status: Never Used  Substance and Sexual Activity   Alcohol use: No    Alcohol/week: 0.0 standard drinks of alcohol   Drug use: No   Sexual activity: Yes    Partners: Female

## 2023-08-03 NOTE — Telephone Encounter (Signed)
 Can you please order bionic sling for patient-Joseph [name] would like patient to start on Monday.

## 2023-08-03 NOTE — Telephone Encounter (Signed)
 done

## 2023-08-05 ENCOUNTER — Encounter: Payer: Self-pay | Admitting: Orthopedic Surgery

## 2023-08-10 ENCOUNTER — Encounter: Payer: Self-pay | Admitting: Surgical

## 2023-08-10 ENCOUNTER — Ambulatory Visit (INDEPENDENT_AMBULATORY_CARE_PROVIDER_SITE_OTHER): Payer: Medicare Other | Admitting: Surgical

## 2023-08-10 DIAGNOSIS — Z96611 Presence of right artificial shoulder joint: Secondary | ICD-10-CM

## 2023-08-10 NOTE — Progress Notes (Signed)
 Post-Op Visit Note   Patient: Joseph [name]           Date of Birth: 10-02-1955           MRN: [id number] Visit Date: 08/10/2023 PCP: Sharlene Dory, DO   Assessment & Plan:  Chief Complaint:  Chief Complaint  Patient presents with   Right Shoulder - Routine Post Op   Visit Diagnoses: No diagnosis found.  Plan: Joseph [name] is a 68 y.o. male who presents s/p right reverse shoulder arthroplasty with vault reconstruction system on 07/26/2023.  Patient is doing well and pain is overall controlled.  Using CPM machine as instructed as of Monday. Denies any chest pain, SOB, fevers, chills.  No complaint of any instability symptoms.  Did use Bactroban cream on the blistering in his axilla which has improved.  No longer draining and there is no fluid in the blisters any longer..    On exam, patient has range of motion 10 degrees X rotation, 60 degrees abduction, 80 degrees forward elevation..  Intact EPL, FPL, finger abduction, finger adduction, pronation/supination, bicep, tricep, deltoid of operative extremity.  Axillary nerve intact with deltoid firing.  Incision is healing well without evidence of infection or dehiscence.  Incision was made sure to be covered with Steri-Strips from the proximal to distal aspect of the length of the incision.  2+ radial pulse of the operative extremity  Plan is continue with sling immobilization but okay to come out throughout the day to use the arm for ADLs as tolerated.  Continue with CPM machine for 3 times per day for 1 hour each session.  Come back in 4 weeks for initiation of physical therapy at that point.  No lifting with the operative arm more than the weight of his cell phone.  Follow-Up Instructions: No follow-ups on file.   Orders:  No orders of the defined types were placed in this encounter.  No orders of the defined types were placed in this encounter.   Imaging: No results found.  PMFS History: Patient Active Problem List    Diagnosis Date Noted   Loosening of total shoulder replacement (HCC) 07/31/2023   S/P reverse total shoulder arthroplasty, right 07/26/2023   Medication management 05/16/2023   PICC (peripherally inserted central catheter) in place 05/16/2023   Prosthetic joint infection (HCC) 04/29/2023   OA (osteoarthritis) of shoulder 04/28/2023   S/P hardware removal 04/28/2023   Umbilical hernia without obstruction and without gangrene 07/05/2022   Insulin resistance 09/03/2019   Cubital tunnel syndrome 07/16/2019   Localized, primary osteoarthritis of shoulder region 07/16/2019   Shoulder pain 07/16/2019   Low testosterone 04/13/2018   Mixed hyperlipidemia    Bilateral sensorineural hearing loss 11/01/2017   Subjective tinnitus of right ear 11/01/2017   Hyperlipidemia 09/14/2017   History of tobacco abuse 09/14/2017   Overweight (BMI 25.0-29.9) 09/14/2017   Vertigo 07/14/2016   Arthritis of hand 02/09/2016   Right hand pain 02/09/2016   Cervical spondylosis with myelopathy 01/15/2016   Lumbar facet arthropathy 12/12/2015   Lumbar stenosis 12/12/2015   Meralgia paresthetica 12/12/2015   Neurofibroma 12/12/2015   Other intervertebral disc degeneration, lumbar region 12/12/2015   Dysphagia 10/31/2015   GERD (gastroesophageal reflux disease) 10/31/2015   Neuropathy 10/21/2015   Essential hypertension 02/11/2015   Shingles 07/10/2014   Low back pain 05/01/2014   Kidney stone 05/01/2014   Past Medical History:  Diagnosis Date   Arthritis    Complication of anesthesia    GERD (gastroesophageal reflux  disease)    Heart murmur    as a child, pt states he had an ECHO about 30 years ago (1995) and it was normal   Hypertension    Mixed hyperlipidemia    Osteoarthritis    Pneumonia    PONV (postoperative nausea and vomiting)    Prediabetes    Rheumatic fever    Vertigo     Family History  Problem Relation Age of Onset   Arthritis Mother        Rheumatoid   Diabetes Mother     Arthritis Father    Hypertension Father    Diabetes Brother    Lupus Brother     Past Surgical History:  Procedure Laterality Date   BACK SURGERY  2012   JOINT REPLACEMENT Right 2013   right shoulder   SPINE SURGERY  1995   C4-5, C5-C6    SYNOVECTOMY WITH REMOVAL OF TOTAL SHOULDER Right 04/28/2023   Procedure: REMOVAL RIGHT SHOULDER IMPLANTS AND PLACEMENT OF ANTIBIOTIC SPACER;  Surgeon: Cammy Copa, MD;  Location: [facility] OR;  Service: Orthopedics;  Laterality: Right;   TONSILLECTOMY  1963   TOTAL SHOULDER REVISION Right 07/26/2023   Procedure: RIGHT REVERSE SHOULDER ARTHROPLASTY, REMOVAL ANTIBIOTIC SPACER;  Surgeon: Cammy Copa, MD;  Location: [facility] OR;  Service: Orthopedics;  Laterality: Right;   Social History   Occupational History   Occupation: Disability    Comment: Shoulder / Back  Tobacco Use   Smoking status: Former    Current packs/day: 0.00    Average packs/day: 1.5 packs/day for 20.0 years (30.0 ttl pk-yrs)    Types: Cigarettes    Start date: 05/01/1970    Quit date: 05/01/1990    Years since quitting: 33.2   Smokeless tobacco: Never  Vaping Use   Vaping status: Never Used  Substance and Sexual Activity   Alcohol use: No    Alcohol/week: 0.0 standard drinks of alcohol   Drug use: No   Sexual activity: Yes    Partners: Female

## 2023-08-16 ENCOUNTER — Encounter: Payer: Self-pay | Admitting: Orthopedic Surgery

## 2023-08-24 NOTE — Discharge Summary (Signed)
 Physician Discharge Summary      Patient ID: Joseph [name] MRN: [id number] DOB/AGE: [birth year] 68 y.o.  Admit date: 07/26/2023 Discharge date: 07/28/2023  Admission Diagnoses:  Principal Problem:   S/P reverse total shoulder arthroplasty, right Active Problems:   Loosening of total shoulder replacement [facility])   Discharge Diagnoses:  Same  Surgeries: Procedure(s): RIGHT REVERSE SHOULDER ARTHROPLASTY, REMOVAL ANTIBIOTIC SPACER on 07/26/2023   Consultants:   Discharged Condition: Stable  Hospital Course: Joseph [name] is an 68 y.o. male who was admitted 07/26/2023 with a chief complaint of right shoulder pain, and found to have a diagnosis of right shoulder arthritis with presence of antibiotic spacer.  They were brought to the operating room on 07/26/2023 and underwent the above named procedures.  Pt awoke from anesthesia without complication and was transferred to the floor. On POD1, patient's pain was overall controlled.  After the block wore off, his pain increased and he had to stay until POD 2.  Had significant improvement of pain on POD 2 to the point where he felt ready for discharge.  Was able to mobilize well without any difficulty.  No red flag signs or symptoms throughout his stay..  Pt will f/u with Dr. August Saucer in clinic in ~2 weeks.   Antibiotics given:  Anti-infectives (From admission, onward)    Start     Dose/Rate Route Frequency Ordered Stop   07/27/23 0045  ceFAZolin (ANCEF) IVPB 2g/100 mL premix        2 g 200 mL/hr over 30 Minutes Intravenous Every 8 hours 07/26/23 2359 07/27/23 1454   07/26/23 1207  vancomycin (VANCOCIN) powder  Status:  Discontinued          As needed 07/26/23 1207 07/26/23 1348   07/26/23 0615  ceFAZolin (ANCEF) IVPB 2g/100 mL premix        2 g 200 mL/hr over 30 Minutes Intravenous On call to O.R. 07/26/23 0601 07/26/23 1207     .  Recent vital signs:  Vitals:   07/27/23 2038 07/28/23 0459  BP: 116/80 (!) 143/81  Pulse: 64 63  Resp:  17 16  Temp: 98 F (36.7 C) 97.9 F (36.6 C)  SpO2: 96% 90%    Recent laboratory studies:  Results for orders placed or performed during the hospital encounter of 07/20/23  Surgical pcr screen   Collection Time: 07/20/23  9:14 AM   Specimen: Nasal Mucosa; Nasal Swab  Result Value Ref Range   MRSA, PCR NEGATIVE NEGATIVE   Staphylococcus aureus NEGATIVE NEGATIVE  Urinalysis, w/ Reflex to Culture (Infection Suspected) -Urine, Clean Catch   Collection Time: 07/20/23  9:14 AM  Result Value Ref Range   Specimen Source URINE, CLEAN CATCH    Color, Urine YELLOW YELLOW   APPearance CLEAR CLEAR   Specific Gravity, Urine 1.009 1.005 - 1.030   pH 5.0 5.0 - 8.0   Glucose, UA NEGATIVE NEGATIVE mg/dL   Hgb urine dipstick NEGATIVE NEGATIVE   Bilirubin Urine NEGATIVE NEGATIVE   Ketones, ur NEGATIVE NEGATIVE mg/dL   Protein, ur NEGATIVE NEGATIVE mg/dL   Nitrite NEGATIVE NEGATIVE   Leukocytes,Ua NEGATIVE NEGATIVE   RBC / HPF 0-5 0 - 5 RBC/hpf   WBC, UA 0-5 0 - 5 WBC/hpf   Bacteria, UA NONE SEEN NONE SEEN   Squamous Epithelial / HPF 0-5 0 - 5 /HPF   Mucus PRESENT   CBC   Collection Time: 07/20/23  9:30 AM  Result Value Ref Range   WBC 6.6 4.0 - 10.5 K/uL  RBC 4.84 4.22 - 5.81 MIL/uL   Hemoglobin 14.3 13.0 - 17.0 g/dL   HCT 40.3 47.4 - 25.9 %   MCV 88.6 80.0 - 100.0 fL   MCH 29.5 26.0 - 34.0 pg   MCHC 33.3 30.0 - 36.0 g/dL   RDW 56.3 87.5 - 64.3 %   Platelets 225 150 - 400 K/uL   nRBC 0.0 0.0 - 0.2 %  Basic metabolic panel   Collection Time: 07/20/23  9:30 AM  Result Value Ref Range   Sodium 139 135 - 145 mmol/L   Potassium 4.2 3.5 - 5.1 mmol/L   Chloride 105 98 - 111 mmol/L   CO2 26 22 - 32 mmol/L   Glucose, Bld 94 70 - 99 mg/dL   BUN 17 8 - 23 mg/dL   Creatinine, Ser 3.29 (H) 0.61 - 1.24 mg/dL   Calcium 9.4 8.9 - 51.8 mg/dL   GFR, Estimated 53 (L) >60 mL/min   Anion gap 8 5 - 15    Discharge Medications:   Allergies as of 07/28/2023       Reactions    Hydrocodone-acetaminophen Nausea Only, Nausea And Vomiting   Other    Intolerance to narcotic medication - causes stomach upset and dizziness and does not relieve pain.         Medication List     TAKE these medications    amLODipine 5 MG tablet Commonly known as: NORVASC Take 1 tablet (5 mg total) by mouth daily.   aspirin EC 81 MG tablet Take 1 tablet (81 mg total) by mouth daily. Swallow whole.   atorvastatin 40 MG tablet Commonly known as: LIPITOR Take 1 tablet (40 mg total) by mouth every evening.   Diclofenac Sodium 1.5 % Soln APPLY TOPICALLY 3 TIMES DAILY AS NEEDED FOR PAIN What changed: See the new instructions.   docusate sodium 100 MG capsule Commonly known as: COLACE Take 1 capsule (100 mg total) by mouth 2 (two) times daily.   HYDROmorphone 2 MG tablet Commonly known as: DILAUDID Take 0.5-1 tablets (1-2 mg total) by mouth every 4 (four) hours as needed for moderate pain (pain score 4-6) (pains score 4-6).   lidocaine 5 % Commonly known as: LIDODERM Place 1 patch onto the skin daily. Remove & Discard patch within 12 hours or as directed by MD What changed:  when to take this reasons to take this   meloxicam 7.5 MG tablet Commonly known as: MOBIC Take 7.5 mg by mouth every evening.   methocarbamol 500 MG tablet Commonly known as: ROBAXIN Take 1 tablet (500 mg total) by mouth every 8 (eight) hours as needed for muscle spasms.   pantoprazole 40 MG tablet Commonly known as: PROTONIX Take 1 tablet by mouth twice daily        Diagnostic Studies: XR Shoulder Right Result Date: 08/05/2023 AP, Scap Y views of right shoulder reviewed.  Vault reconstruction reverse shoulder replacement implant looks to be in good position without complication.  No periprosthetic fracture.  No dislocation.  DG Shoulder Right Port Result Date: 07/26/2023 CLINICAL DATA:  Status post shoulder surgery. EXAM: RIGHT SHOULDER - 1 VIEW COMPARISON:  Preoperative imaging FINDINGS:  Reverse right shoulder arthroplasty in expected alignment. No periprosthetic lucency or fracture. Recent postsurgical change includes air and edema in the joint space and soft tissues. IMPRESSION: Reverse right shoulder arthroplasty without immediate postoperative complication. Electronically Signed   By: Narda Rutherford M.D.   On: 07/26/2023 15:27    Disposition: Discharge disposition: 01-Home or  Self Care       Discharge Instructions     Call MD / Call 911   Complete by: As directed    If you experience chest pain or shortness of breath, CALL 911 and be transported to the hospital emergency room.  If you develope a fever above 101 F, pus (white drainage) or increased drainage or redness at the wound, or calf pain, call your surgeon's office.   Constipation Prevention   Complete by: As directed    Drink plenty of fluids.  Prune juice may be helpful.  You may use a stool softener, such as Colace (over the counter) 100 mg twice a day.  Use MiraLax (over the counter) for constipation as needed.   Diet - low sodium heart healthy   Complete by: As directed    Discharge instructions   Complete by: As directed    You may shower, dressing is waterproof.  Do not bathe or soak the operative shoulder in a tub, pool.  No range of motion of the operative shoulder.  No lifting with the operative shoulder. Continue use of the sling.  Okay for range of motion of the elbow, wrist, hand as discussed.  Continue using the wound VAC which does require charge.  This will turn off in approximately 1 week from your discharge date and at that point, contact the office and we can bring you in to change out from the wound VAC sponge dressing to a new waterproof dressing as well as looking at the incision.  Otherwise, follow-up with Dr. August Saucer or Drema Pry in ~2 weeks on your given appointment date.    Dental Antibiotics:  In most cases prophylactic antibiotics for Dental procdeures after total joint surgery are not  necessary.  Exceptions are as follows:  1. History of prior total joint infection  2. Severely immunocompromised (Organ Transplant, cancer chemotherapy, Rheumatoid biologic meds such as Humera)  3. Poorly controlled diabetes (A1C &gt; 8.0, blood glucose over 200)  If you have one of these conditions, contact your surgeon for an antibiotic prescription, prior to your dental procedure.   Increase activity slowly as tolerated   Complete by: As directed    Post-operative opioid taper instructions:   Complete by: As directed    POST-OPERATIVE OPIOID TAPER INSTRUCTIONS: It is important to wean off of your opioid medication as soon as possible. If you do not need pain medication after your surgery it is ok to stop day one. Opioids include: Codeine, Hydrocodone(Norco, Vicodin), Oxycodone(Percocet, oxycontin) and hydromorphone amongst others.  Long term and even short term use of opiods can cause: Increased pain response Dependence Constipation Depression Respiratory depression And more.  Withdrawal symptoms can include Flu like symptoms Nausea, vomiting And more Techniques to manage these symptoms Hydrate well Eat regular healthy meals Stay active Use relaxation techniques(deep breathing, meditating, yoga) Do Not substitute Alcohol to help with tapering If you have been on opioids for less than two weeks and do not have pain than it is ok to stop all together.  Plan to wean off of opioids This plan should start within one week post op of your joint replacement. Maintain the same interval or time between taking each dose and first decrease the dose.  Cut the total daily intake of opioids by one tablet each day Next start to increase the time between doses. The last dose that should be eliminated is the evening dose.  SignedKarenann Cai 08/24/2023, 11:26 AM

## 2023-08-29 ENCOUNTER — Telehealth: Payer: Self-pay | Admitting: Family Medicine

## 2023-08-29 NOTE — Telephone Encounter (Signed)
 Copied from CRM [id number]. Topic: Appointments - Scheduling Inquiry for Clinic >> Aug 29, 2023  9:40 AM Lorin Glass B wrote: Reason for CRM: Patient called in to request Annual Physical with Dr. Carmelia Roller but system not allowing schedule due to Annual Wellness visit being due. Patient would like to schedule Physical for next available in the AM. And would like to discuss wife scheduling sometimes close to that appointment day and time. Callback [phone]

## 2023-08-30 NOTE — Telephone Encounter (Signed)
 Called pt and got him scheduled for cpe, pt's wife was also made a appt to establish care

## 2023-09-07 ENCOUNTER — Other Ambulatory Visit (INDEPENDENT_AMBULATORY_CARE_PROVIDER_SITE_OTHER): Payer: Self-pay

## 2023-09-07 ENCOUNTER — Ambulatory Visit: Admitting: Orthopedic Surgery

## 2023-09-07 ENCOUNTER — Other Ambulatory Visit: Payer: Self-pay | Admitting: Family Medicine

## 2023-09-07 DIAGNOSIS — M19019 Primary osteoarthritis, unspecified shoulder: Secondary | ICD-10-CM

## 2023-09-07 DIAGNOSIS — M792 Neuralgia and neuritis, unspecified: Secondary | ICD-10-CM

## 2023-09-07 DIAGNOSIS — I10 Essential (primary) hypertension: Secondary | ICD-10-CM

## 2023-09-07 DIAGNOSIS — Z96611 Presence of right artificial shoulder joint: Secondary | ICD-10-CM

## 2023-09-07 NOTE — Progress Notes (Signed)
 Post-Op Visit Note   Patient: Joseph [name]           Date of Birth: May 29, 1956           MRN: [id number] Visit Date: 09/07/2023 PCP: Sharlene Dory, DO   Assessment & Plan:  Chief Complaint:  Chief Complaint  Patient presents with   Right Shoulder - Follow-up    6 wk F/U for Right shoulder surgery No rec. Falls or injuries Advising he has some tingling and numbness occ in right hand   Visit Diagnoses:  1. History of arthroplasty of right shoulder   2. Shoulder arthritis   3. Radicular pain in right arm     Plan: [name] is a patient is now 6 weeks out right reverse shoulder reconstruction with wall reconstruction system.  Has the accordion brace at home.  Describes an aching type pain.  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  Does not want to see anyone at the neurosurgery group in [place].  On examination deltoid fires.  Range of motion is improving.  Radiographs look reasonable in terms of no lucencies around the screws.  Plan at this time is passive range of motion only in 6-week return with repeat radiographs.  Follow-Up Instructions: No follow-ups on file.   Orders:  Orders Placed This Encounter  Procedures   XR Shoulder Right   Ambulatory referral to Orthopedic Surgery   No orders of the defined types were placed in this encounter.   Imaging: XR Shoulder Right Result Date: 09/07/2023 AP axillary outlet radiographs right shoulder reviewed.  Reverse shoulder prosthesis in good position alignment.  No evidence of hardware complication on the humeral side or glenoid side.  Central glenoid screw in good position.  No lucency around any of the screws.   PMFS History: Patient Active Problem List   Diagnosis Date Noted   Loosening of total shoulder replacement (HCC) 07/31/2023   S/P reverse total shoulder arthroplasty, right 07/26/2023   Medication management  05/16/2023   PICC (peripherally inserted central catheter) in place 05/16/2023   Prosthetic joint infection (HCC) 04/29/2023   OA (osteoarthritis) of shoulder 04/28/2023   S/P hardware removal 04/28/2023   Umbilical hernia without obstruction and without gangrene 07/05/2022   Insulin resistance 09/03/2019   Cubital tunnel syndrome 07/16/2019   Localized, primary osteoarthritis of shoulder region 07/16/2019   Shoulder pain 07/16/2019   Low testosterone 04/13/2018   Mixed hyperlipidemia    Bilateral sensorineural hearing loss 11/01/2017   Subjective tinnitus of right ear 11/01/2017   Hyperlipidemia 09/14/2017   History of tobacco abuse 09/14/2017   Overweight (BMI 25.0-29.9) 09/14/2017   Vertigo 07/14/2016   Arthritis of hand 02/09/2016   Right hand pain 02/09/2016   Cervical spondylosis with myelopathy 01/15/2016   Lumbar facet arthropathy 12/12/2015   Lumbar stenosis 12/12/2015   Meralgia paresthetica 12/12/2015   Neurofibroma 12/12/2015   Other intervertebral disc degeneration, lumbar region 12/12/2015   Dysphagia 10/31/2015   GERD (gastroesophageal reflux disease) 10/31/2015   Neuropathy 10/21/2015   Essential hypertension 02/11/2015   Shingles 07/10/2014   Low back pain 05/01/2014   Kidney stone 05/01/2014   Past Medical History:  Diagnosis Date   Arthritis    Complication of anesthesia    GERD (gastroesophageal reflux disease)    Heart murmur    as a child, pt states he had an ECHO about 30 years ago (1995) and it was  normal   Hypertension    Mixed hyperlipidemia    Osteoarthritis    Pneumonia    PONV (postoperative nausea and vomiting)    Prediabetes    Rheumatic fever    Vertigo     Family History  Problem Relation Age of Onset   Arthritis Mother        Rheumatoid   Diabetes Mother    Arthritis Father    Hypertension Father    Diabetes Brother    Lupus Brother     Past Surgical History:  Procedure Laterality Date   BACK SURGERY  2012   JOINT  REPLACEMENT Right 2013   right shoulder   SPINE SURGERY  1995   C4-5, C5-C6    SYNOVECTOMY WITH REMOVAL OF TOTAL SHOULDER Right 04/28/2023   Procedure: REMOVAL RIGHT SHOULDER IMPLANTS AND PLACEMENT OF ANTIBIOTIC SPACER;  Surgeon: Cammy Copa, MD;  Location: [facility] OR;  Service: Orthopedics;  Laterality: Right;   TONSILLECTOMY  1963   TOTAL SHOULDER REVISION Right 07/26/2023   Procedure: RIGHT REVERSE SHOULDER ARTHROPLASTY, REMOVAL ANTIBIOTIC SPACER;  Surgeon: Cammy Copa, MD;  Location: [facility] OR;  Service: Orthopedics;  Laterality: Right;   Social History   Occupational History   Occupation: Disability    Comment: Shoulder / Back  Tobacco Use   Smoking status: Former    Current packs/day: 0.00    Average packs/day: 1.5 packs/day for 20.0 years (30.0 ttl pk-yrs)    Types: Cigarettes    Start date: 05/01/1970    Quit date: 05/01/1990    Years since quitting: 33.3   Smokeless tobacco: Never  Vaping Use   Vaping status: Never Used  Substance and Sexual Activity   Alcohol use: No    Alcohol/week: 0.0 standard drinks of alcohol   Drug use: No   Sexual activity: Yes    Partners: Female

## 2023-09-13 ENCOUNTER — Encounter: Payer: Self-pay | Admitting: Family Medicine

## 2023-09-13 ENCOUNTER — Ambulatory Visit (INDEPENDENT_AMBULATORY_CARE_PROVIDER_SITE_OTHER): Admitting: Family Medicine

## 2023-09-13 VITALS — BP 132/86 | HR 70 | Temp 97.8°F | Ht 74.0 in | Wt 219.6 lb

## 2023-09-13 DIAGNOSIS — I10 Essential (primary) hypertension: Secondary | ICD-10-CM

## 2023-09-13 DIAGNOSIS — K219 Gastro-esophageal reflux disease without esophagitis: Secondary | ICD-10-CM | POA: Diagnosis not present

## 2023-09-13 DIAGNOSIS — Z0000 Encounter for general adult medical examination without abnormal findings: Secondary | ICD-10-CM | POA: Diagnosis not present

## 2023-09-13 DIAGNOSIS — Z125 Encounter for screening for malignant neoplasm of prostate: Secondary | ICD-10-CM | POA: Diagnosis not present

## 2023-09-13 LAB — LIPID PANEL
Cholesterol: 129 mg/dL (ref 0–200)
HDL: 43.4 mg/dL (ref >39.00)
LDL Cholesterol: 47 mg/dL (ref 0–99)
NonHDL: 85.25
Total CHOL/HDL Ratio: 3
Triglycerides: 192 mg/dL — ABNORMAL HIGH (ref 0.0–149.0)
VLDL: 38.4 mg/dL (ref 0.0–40.0)

## 2023-09-13 LAB — CBC
HCT: 40.1 % (ref 39.0–52.0)
Hemoglobin: 13.3 g/dL (ref 13.0–17.0)
MCHC: 33.3 g/dL (ref 30.0–36.0)
MCV: 87.1 fl (ref 78.0–100.0)
Platelets: 278 10*3/uL (ref 150.0–400.0)
RBC: 4.61 Mil/uL (ref 4.22–5.81)
RDW: 13.8 % (ref 11.5–15.5)
WBC: 5.1 10*3/uL (ref 4.0–10.5)

## 2023-09-13 LAB — COMPREHENSIVE METABOLIC PANEL WITH GFR
ALT: 16 U/L (ref 0–53)
AST: 18 U/L (ref 0–37)
Albumin: 4.7 g/dL (ref 3.5–5.2)
Alkaline Phosphatase: 110 U/L (ref 39–117)
BUN: 17 mg/dL (ref 6–23)
CO2: 27 meq/L (ref 19–32)
Calcium: 9.6 mg/dL (ref 8.4–10.5)
Chloride: 105 meq/L (ref 96–112)
Creatinine, Ser: 1.43 mg/dL (ref 0.40–1.50)
GFR: 50.11 mL/min — ABNORMAL LOW (ref >60.00)
Glucose, Bld: 91 mg/dL (ref 70–99)
Potassium: 4 meq/L (ref 3.5–5.1)
Sodium: 140 meq/L (ref 135–145)
Total Bilirubin: 0.4 mg/dL (ref 0.2–1.2)
Total Protein: 7.3 g/dL (ref 6.0–8.3)

## 2023-09-13 LAB — PSA, MEDICARE: PSA: 2.79 ng/mL (ref 0.10–4.00)

## 2023-09-13 NOTE — Patient Instructions (Signed)
 Give [place] 2-3 business days to get the results of your labs back.   Keep the diet clean and stay active.  Let us know if you need anything.

## 2023-09-13 NOTE — Progress Notes (Addendum)
 Subjective:   Joseph [name] is a 68 y.o. male who presents for Medicare Annual/Subsequent preventive examination.  Visit Complete: In person  Patient Medicare AWV questionnaire was completed by the patient on 09/13/23; I have confirmed that all information answered by patient is correct and no changes since this date.  Hypertension Patient presents for hypertension follow up. He does not monitor home blood pressures. He is compliant with medications- Norvasc 5 mg/d. Patient has these side effects of medication: none He is adhering to a healthy diet overall. Exercise: walking  GERD- hx of reflux on Protonix 40 mg bid. Compliant, no AE's. No new difficulty swallowing. No unexplained wt loss or bleeding. S/s's relatively well controlled.   Cardiac Risk Factors include: advanced age (>39men, >72 women);hypertension;male gender     Objective:    Today's Vitals   09/13/23 0957 09/13/23 1025  BP: 132/86   Pulse: 70   Temp: 97.8 F (36.6 C)   SpO2: 98%   Weight: 219 lb 9.6 oz (99.6 kg)   Height: 6\' 2"  (1.88 m)   PainSc: 6  5    Body mass index is 28.19 kg/m.     09/13/2023   12:03 PM 09/13/2023   10:01 AM 07/20/2023    9:09 AM 04/28/2023    2:36 PM 04/21/2023    9:31 AM 08/03/2016    4:36 PM 08/03/2016    8:55 AM  Advanced Directives  Does Patient Have a Medical Advance Directive? No Yes Yes Yes Yes  Yes  Type of Special educational needs teacher of [place];Living will;Out of facility DNR (pink MOST or yellow form) Healthcare Power of [place];Living will Living will Living will;Healthcare Power of Attorney Living will   Does patient want to make changes to medical advance directive?  No - Patient declined No - Patient declined No - Patient declined     Copy of Healthcare Power of Attorney in Chart?  No - copy requested No - copy requested Yes - validated most recent copy scanned in chart (See row information) No - copy requested      Current Medications  (verified) Outpatient Encounter Medications as of 09/13/2023  Medication Sig   amLODipine (NORVASC) 5 MG tablet Take 1 tablet (5 mg total) by mouth daily.   aspirin EC 81 MG tablet Take 1 tablet (81 mg total) by mouth daily. Swallow whole.   atorvastatin (LIPITOR) 40 MG tablet Take 1 tablet (40 mg total) by mouth every evening.   Diclofenac Sodium 1.5 % SOLN APPLY TOPICALLY 3 TIMES DAILY AS NEEDED FOR PAIN (Patient taking differently: Apply 1 Application topically in the morning and at bedtime.)   docusate sodium (COLACE) 100 MG capsule Take 1 capsule (100 mg total) by mouth 2 (two) times daily.   HYDROmorphone (DILAUDID) 2 MG tablet Take 0.5-1 tablets (1-2 mg total) by mouth every 4 (four) hours as needed for moderate pain (pain score 4-6) (pains score 4-6).   lidocaine (LIDODERM) 5 % Place 1 patch onto the skin daily. Remove & Discard patch within 12 hours or as directed by MD (Patient taking differently: Place 1 patch onto the skin daily as needed (pain.). Remove & Discard patch within 12 hours or as directed by MD)   methocarbamol (ROBAXIN) 500 MG tablet Take 1 tablet (500 mg total) by mouth every 8 (eight) hours as needed for muscle spasms.   pantoprazole (PROTONIX) 40 MG tablet Take 1 tablet by mouth twice daily   [DISCONTINUED] meloxicam (MOBIC) 7.5 MG tablet Take 7.5  mg by mouth every evening.   No facility-administered encounter medications on file as of 09/13/2023.    Allergies (verified) Hydrocodone-acetaminophen and Other   History: Past Medical History:  Diagnosis Date   Arthritis    Complication of anesthesia    GERD (gastroesophageal reflux disease)    Heart murmur    as a child, pt states he had an ECHO about 30 years ago (1995) and it was normal   Hypertension    Mixed hyperlipidemia    Osteoarthritis    Pneumonia    PONV (postoperative nausea and vomiting)    Prediabetes    Rheumatic fever    Vertigo    Past Surgical History:  Procedure Laterality Date   BACK  SURGERY  2012   JOINT REPLACEMENT Right 2013   right shoulder   SPINE SURGERY  1995   C4-5, C5-C6    SYNOVECTOMY WITH REMOVAL OF TOTAL SHOULDER Right 04/28/2023   Procedure: REMOVAL RIGHT SHOULDER IMPLANTS AND PLACEMENT OF ANTIBIOTIC SPACER;  Surgeon: Cammy Copa, MD;  Location: [facility] OR;  Service: Orthopedics;  Laterality: Right;   TONSILLECTOMY  1963   TOTAL SHOULDER REVISION Right 07/26/2023   Procedure: RIGHT REVERSE SHOULDER ARTHROPLASTY, REMOVAL ANTIBIOTIC SPACER;  Surgeon: Cammy Copa, MD;  Location: [facility] OR;  Service: Orthopedics;  Laterality: Right;   Family History  Problem Relation Age of Onset   Arthritis Mother        Rheumatoid   Diabetes Mother    Arthritis Father    Hypertension Father    Diabetes Brother    Lupus Brother    Social History   Socioeconomic History   Marital status: Married    Spouse name: Not on file   Number of children: 2   Years of education: 12   Highest education level: Not on file  Occupational History   Occupation: Disability    Comment: Shoulder / Back  Tobacco Use   Smoking status: Former    Current packs/day: 0.00    Average packs/day: 1.5 packs/day for 20.0 years (30.0 ttl pk-yrs)    Types: Cigarettes    Start date: 05/01/1970    Quit date: 05/01/1990    Years since quitting: 33.3   Smokeless tobacco: Never  Vaping Use   Vaping status: Never Used  Substance and Sexual Activity   Alcohol use: No    Alcohol/week: 0.0 standard drinks of alcohol   Drug use: No   Sexual activity: Yes    Partners: Female  Other Topics Concern   Not on file  Social History Narrative   Born and raised in [place], [place]. Currently resides in a private residence with wife Stanton Kidney). No pets. Fun: Work on classic cars;    Denies religious beliefs effecting health care.    Social Drivers of Corporate investment banker Strain: Low Risk  (09/13/2023)   Overall Financial Resource Strain (CARDIA)    Difficulty of Paying Living Expenses:  Not hard at all  Food Insecurity: No Food Insecurity (09/13/2023)   Hunger Vital Sign    Worried About Running Out of Food in the Last Year: Never true    Ran Out of Food in the Last Year: Never true  Transportation Needs: No Transportation Needs (09/13/2023)   PRAPARE - Administrator, Civil Service (Medical): No    Lack of Transportation (Non-Medical): No  Physical Activity: Insufficiently Active (09/13/2023)   Exercise Vital Sign    Days of Exercise per Week: 7 days  Minutes of Exercise per Session: 20 min  Stress: No Stress Concern Present (09/13/2023)   [employer] of Occupational Health - Occupational Stress Questionnaire    Feeling of Stress : Not at all  Social Connections: Moderately Integrated (09/13/2023)   Social Connection and Isolation Panel [NHANES]    Frequency of Communication with Friends and Family: Once a week    Frequency of Social Gatherings with Friends and Family: Never    Attends Religious Services: More than 4 times per year    Active Member of [employer] or Organizations: Yes    Attends Engineer, structural: More than 4 times per year    Marital Status: Married  Recent Concern: Social Connections - Socially Isolated (07/27/2023)   Social Connection and Isolation Panel [NHANES]    Frequency of Communication with Friends and Family: Never    Frequency of Social Gatherings with Friends and Family: Never    Attends Religious Services: Never    Database administrator or Organizations: No    Attends Engineer, structural: Never    Marital Status: Married    Tobacco Counseling Counseling given: Not Answered   Clinical Intake:  Pre-visit preparation completed: No  Pain : 0-10 Pain Score: 5  Pain Type: Chronic pain Pain Location: Back Pain Orientation: Mid, Lower Pain Descriptors / Indicators: Aching, Discomfort Pain Onset: More than a month ago Pain Frequency: Constant     BMI - recorded: 28.19 Nutritional Status: BMI 25 -29  Overweight Nutritional Risks: None Diabetes: No  How often do you need to have someone help you when you read instructions, pamphlets, or other written materials from your doctor or pharmacy?: 1 - Never What is the last grade level you completed in school?: GED  Interpreter Needed?: No  Information entered by :: Porsha McClurkin,CMA   Activities of Daily Living    09/13/2023   10:26 AM 09/13/2023   10:00 AM  In your present state of health, do you have any difficulty performing the following activities:  Hearing? 0 0  Vision? 1 0  Difficulty concentrating or making decisions? 0 0  Walking or climbing stairs? 0 0  Dressing or bathing? 0 0  Doing errands, shopping? 0 0  Preparing Food and eating ? N N  Using the Toilet? N N  In the past six months, have you accidently leaked urine? N N  Do you have problems with loss of bowel control? N N  Managing your Medications? N N  Managing your Finances? N N  Housekeeping or managing your Housekeeping? N N    Patient Care Team: Sharlene Dory, DO as PCP - General (Family Medicine)  Indicate any recent Medical Services you may have received from other than [facility] providers in the past year (date may be approximate).     Assessment:   This is a routine wellness examination for Joseph [name].  Hearing/Vision screen Hearing Screening - Comments:: No hearing concerns Vision Screening - Comments:: No vision concerns   Goals Addressed   None    Depression Screen    09/13/2023   10:02 AM 05/21/2022    1:25 PM 10/14/2020    1:05 PM  PHQ 2/9 Scores  PHQ - 2 Score 0 0 0    Fall Risk    09/13/2023   10:02 AM 07/05/2022    8:24 AM 10/14/2020    1:05 PM  Fall Risk   Falls in the past year? 0 0 0  Number falls in past yr: 0  0 0  Injury with Fall? 0 0 0  Risk for fall due to : No Fall Risks No Fall Risks No Fall Risks  Follow up Falls evaluation completed  Falls evaluation completed    MEDICARE RISK AT HOME: Medicare Risk at  Home Any stairs in or around the home?: Yes If so, are there any without handrails?: No Home free of loose throw rugs in walkways, pet beds, electrical cords, etc?: Yes Adequate lighting in your home to reduce risk of falls?: Yes Life alert?: No Use of a cane, walker or w/c?: No Grab bars in the bathroom?: No Shower chair or bench in shower?: No Elevated toilet seat or a handicapped toilet?: No  TIMED UP AND GO:  Was the test performed?  Yes  Length of time to ambulate 10 feet: 8 sec Gait steady and fast without use of assistive device    Cognitive Function: A&Ox4  Immunizations Immunization History  Administered Date(s) Administered   Influenza,inj,Quad PF,6+ Mos 05/31/2018   PFIZER(Purple Top)SARS-COV-2 Vaccination 08/09/2019   Td 09/13/2015    TDAP status: Up to date  Flu Vaccine status: N/A  Pneumococcal vaccine status: Up to date  Qualifies for Shingles Vaccine? Yes   Zostavax completed No   Shingrix Completed?: No.    Education has been provided regarding the importance of this vaccine. Patient has been advised to call insurance company to determine out of pocket expense if they have not yet received this vaccine. Advised may also receive vaccine at local pharmacy or Health Dept. Verbalized acceptance and understanding.  Screening Tests Health Maintenance  Topic Date Due   INFLUENZA VACCINE  01/13/2024   Medicare Annual Wellness (AWV)  09/12/2024   DTaP/Tdap/Td (2 - Tdap) 09/12/2025   Colonoscopy  11/24/2025   Hepatitis C Screening  Completed   HPV VACCINES  Aged Out   Pneumonia Vaccine 27+ Years old  Discontinued   COVID-19 Vaccine  Discontinued   Zoster Vaccines- Shingrix  Discontinued    Health Maintenance   Colorectal cancer screening: Type of screening: Colonoscopy. Completed 08/24/2025. Repeat every 10 years  Lung Cancer Screening: (Low Dose CT Chest recommended if Age 75-80 years, 20 pack-year currently smoking OR have quit w/in 15years.) does not  qualify.   Lung Cancer Screening Referral: N/A  Additional Screening:  Hepatitis C Screening: does qualify; Completed  Vision Screening: Recommended annual ophthalmology exams for early detection of glaucoma and other disorders of the eye. Is the patient up to date with their annual eye exam?  No  Who is the provider or what is the name of the office in which the patient attends annual eye exams? N/A If pt is not established with a provider, would they like to be referred to a provider to establish care? No .   Dental Screening: Recommended annual dental exams for proper oral hygiene  Diabetic Foot Exam: N/A  Community Resource Referral / Chronic Care Management: CRR required this visit?  No   CCM required this visit?  No     Plan:  Encounter for Medicare annual wellness exam  Essential hypertension - Plan: CBC, Comprehensive metabolic panel with GFR, Lipid panel -Chronic, stable.  Continue amlodipine 5 mg daily.  Counseled on diet and exercise.  Screening for prostate cancer - Plan: PSA, Medicare ( [place] only) -Counseled on risks and benefits of PSA screening.  This is the last year we will screen unless there is a change in his family history.  Gastroesophageal reflux disease, unspecified whether esophagitis  present -Chronic, stable.  Continue Protonix 40 mg twice daily.  I have personally reviewed and noted the following in the patient's chart:   Medical and social history Use of alcohol, tobacco or illicit drugs  Current medications and supplements including opioid prescriptions. Patient is not currently taking opioid prescriptions from [place]. Functional ability and status Nutritional status Physical activity Advanced directives List of other physicians Hospitalizations, surgeries, and ER visits in previous 12 months Vitals Screenings to include cognitive, depression, and falls Referrals and appointments  In addition, I have reviewed and discussed with  patient certain preventive protocols, quality metrics, and best practice recommendations. A written personalized care plan for preventive services as well as general preventive health recommendations were provided to patient.     Jilda Roche [place], DO   09/13/2023   After Visit Summary: (In Person-Printed) AVS printed and given to the patient

## 2023-09-13 NOTE — Progress Notes (Deleted)
 Subjective:    Joseph [name] is a 68 y.o. male who presents for a Welcome to Medicare exam.   Cardiac Risk Factors include: advanced age (>38men, >92 women);male gender     Objective:    Today's Vitals   09/13/23 0957  Weight: 219 lb 9.6 oz (99.6 kg)  Height: 6\' 2"  (1.88 m)  PainSc: 6    Body mass index is 28.19 kg/m.  Medications Outpatient Encounter Medications as of 09/13/2023  Medication Sig   amLODipine (NORVASC) 5 MG tablet Take 1 tablet (5 mg total) by mouth daily.   aspirin EC 81 MG tablet Take 1 tablet (81 mg total) by mouth daily. Swallow whole.   atorvastatin (LIPITOR) 40 MG tablet Take 1 tablet (40 mg total) by mouth every evening.   Diclofenac Sodium 1.5 % SOLN APPLY TOPICALLY 3 TIMES DAILY AS NEEDED FOR PAIN (Patient taking differently: Apply 1 Application topically in the morning and at bedtime.)   docusate sodium (COLACE) 100 MG capsule Take 1 capsule (100 mg total) by mouth 2 (two) times daily.   HYDROmorphone (DILAUDID) 2 MG tablet Take 0.5-1 tablets (1-2 mg total) by mouth every 4 (four) hours as needed for moderate pain (pain score 4-6) (pains score 4-6).   lidocaine (LIDODERM) 5 % Place 1 patch onto the skin daily. Remove & Discard patch within 12 hours or as directed by MD (Patient taking differently: Place 1 patch onto the skin daily as needed (pain.). Remove & Discard patch within 12 hours or as directed by MD)   meloxicam (MOBIC) 7.5 MG tablet Take 7.5 mg by mouth every evening.   methocarbamol (ROBAXIN) 500 MG tablet Take 1 tablet (500 mg total) by mouth every 8 (eight) hours as needed for muscle spasms.   pantoprazole (PROTONIX) 40 MG tablet Take 1 tablet by mouth twice daily   No facility-administered encounter medications on file as of 09/13/2023.     History: Past Medical History:  Diagnosis Date   Arthritis    Complication of anesthesia    GERD (gastroesophageal reflux disease)    Heart murmur    as a child, pt states he had an ECHO about 30  years ago (1995) and it was normal   Hypertension    Mixed hyperlipidemia    Osteoarthritis    Pneumonia    PONV (postoperative nausea and vomiting)    Prediabetes    Rheumatic fever    Vertigo    Past Surgical History:  Procedure Laterality Date   BACK SURGERY  2012   JOINT REPLACEMENT Right 2013   right shoulder   SPINE SURGERY  1995   C4-5, C5-C6    SYNOVECTOMY WITH REMOVAL OF TOTAL SHOULDER Right 04/28/2023   Procedure: REMOVAL RIGHT SHOULDER IMPLANTS AND PLACEMENT OF ANTIBIOTIC SPACER;  Surgeon: Cammy Copa, MD;  Location: [facility] OR;  Service: Orthopedics;  Laterality: Right;   TONSILLECTOMY  1963   TOTAL SHOULDER REVISION Right 07/26/2023   Procedure: RIGHT REVERSE SHOULDER ARTHROPLASTY, REMOVAL ANTIBIOTIC SPACER;  Surgeon: Cammy Copa, MD;  Location: [facility] OR;  Service: Orthopedics;  Laterality: Right;    Family History  Problem Relation Age of Onset   Arthritis Mother        Rheumatoid   Diabetes Mother    Arthritis Father    Hypertension Father    Diabetes Brother    Lupus Brother    Social History   Occupational History   Occupation: Disability    Comment: Shoulder / Back  Tobacco Use  Smoking status: Former    Current packs/day: 0.00    Average packs/day: 1.5 packs/day for 20.0 years (30.0 ttl pk-yrs)    Types: Cigarettes    Start date: 05/01/1970    Quit date: 05/01/1990    Years since quitting: 33.3   Smokeless tobacco: Never  Vaping Use   Vaping status: Never Used  Substance and Sexual Activity   Alcohol use: No    Alcohol/week: 0.0 standard drinks of alcohol   Drug use: No   Sexual activity: Yes    Partners: Female    Tobacco Counseling Counseling given: Not Answered   Immunizations and Health Maintenance Immunization History  Administered Date(s) Administered   Influenza,inj,Quad PF,6+ Mos 05/31/2018   PFIZER(Purple Top)SARS-COV-2 Vaccination 08/09/2019   Td 09/13/2015   There are no preventive care reminders to display for  this patient.  Activities of Daily Living    09/13/2023   10:00 AM 07/26/2023    6:04 AM  In your present state of health, do you have any difficulty performing the following activities:  Hearing? 0 0  Vision? 0 0  Difficulty concentrating or making decisions? 0 0  Walking or climbing stairs? 0   Dressing or bathing? 0   Doing errands, shopping? 0   Preparing Food and eating ? N   Using the Toilet? N   In the past six months, have you accidently leaked urine? N   Do you have problems with loss of bowel control? N   Managing your Medications? N   Managing your Finances? N   Housekeeping or managing your Housekeeping? N     Physical Exam   Physical Exam (optional), or other factors deemed appropriate based on the beneficiary's medical and social history and current clinical standards.   Advanced Directives: Does Patient Have a Medical Advance Directive?: Yes Type of Advance Directive: Healthcare Power of Attorney, Living will, Out of facility DNR (pink MOST or yellow form) Does patient want to make changes to medical advance directive?: No - Patient declined Copy of Healthcare Power of Attorney in Chart?: No - copy requested   EKG:  {ekg findings:315101}     Assessment:    This is a routine wellness  examination for this patient . ***  Vision/Hearing screen Hearing Screening - Comments:: No hearing concerns Vision Screening - Comments:: No vision concerns   Goals   None      Depression Screen    09/13/2023   10:02 AM 05/21/2022    1:25 PM 10/14/2020    1:05 PM  PHQ 2/9 Scores  PHQ - 2 Score 0 0 0     Fall Risk    09/13/2023   10:02 AM  Fall Risk   Falls in the past year? 0  Number falls in past yr: 0  Injury with Fall? 0  Risk for fall due to : No Fall Risks  Follow up Falls evaluation completed    Cognitive Function        Patient Care Team: Sharlene Dory, DO as PCP - General (Family Medicine)     Plan:   ***  I have personally  reviewed and noted the following in the patient's chart:   Medical and social history Use of alcohol, tobacco or illicit drugs  Current medications and supplements Functional ability and status Nutritional status Physical activity Advanced directives List of other physicians Hospitalizations, surgeries, and ER visits in previous 12 months Vitals Screenings to include cognitive, depression, and falls Referrals and appointments  In  addition, I have reviewed and discussed with patient certain preventive protocols, quality metrics, and best practice recommendations. A written personalized care plan for preventive services as well as general preventive health recommendations were provided to patient.     Janyth Pupa [facility]  09/13/2023

## 2023-09-14 ENCOUNTER — Other Ambulatory Visit: Payer: Self-pay

## 2023-09-14 DIAGNOSIS — E782 Mixed hyperlipidemia: Secondary | ICD-10-CM

## 2023-09-14 NOTE — Progress Notes (Signed)
 Patient was scheduled for 10/14/23 due to going out of town and lipid panel ordered.

## 2023-09-14 NOTE — Progress Notes (Signed)
 lipid

## 2023-09-20 ENCOUNTER — Ambulatory Visit (INDEPENDENT_AMBULATORY_CARE_PROVIDER_SITE_OTHER): Admitting: Orthopedic Surgery

## 2023-09-20 ENCOUNTER — Other Ambulatory Visit (INDEPENDENT_AMBULATORY_CARE_PROVIDER_SITE_OTHER): Payer: Self-pay

## 2023-09-20 VITALS — BP 149/65 | HR 88 | Ht 74.0 in | Wt 220.0 lb

## 2023-09-20 DIAGNOSIS — M545 Low back pain, unspecified: Secondary | ICD-10-CM | POA: Diagnosis not present

## 2023-09-20 NOTE — Progress Notes (Signed)
 Orthopedic Spine Surgery Office Note  Assessment: Patient is a 68 y.o. male with low back pain and leg pain with walking and standing that improves with flexion of the lumbar spine consistent with neurogenic claudication.  Has central stenosis at L2/3   Plan: -Explained that initially conservative treatment is tried as a significant number of patients may experience relief with these treatment modalities. Discussed that the conservative treatments include:  -activity modification  -physical therapy  -over the counter pain medications  -medrol dosepak  -lumbar steroid injections -Patient has tried: Tylenol, oral steroids, gabapentin, PT, chiropractor -Has seen neurosurgery recently about his schwannoma.  No operative intervention was recommended.  Explained that neurosurgery deals with these intradural tumors so I would defer all treatment recommendations time for that -For his lumbar stenosis and radiating leg pain, he has tried multiple conservative treatments over the last 6 months without any relief so discussed surgery as a treatment option.  After this discussion patient elected to proceed -Patient will not be seen a date of surgery   The patient has symptoms consistent with lumbar stenosis. The patient's symptoms were not improving with conservative treatment so operative management was discussed in the form of L2/3 laminectomy. The risks including but not limited to iatrogenic instability, dural tear, nerve root injury, paralysis, persistent pain, infection, bleeding, heart attack, death, fracture, and need for additional procedures were discussed with the patient. The benefit of the surgery would be improvement in the patient's radiating leg pain. I explained that back pain relief is not the goal of the surgery and it is not reliably alleviated with this surgery especially in his case where he has had back pain for close to 30 years. The alternatives to surgical management were covered with  the patient and included continued monitoring, physical therapy, over-the-counter pain medications, ambulatory aids, injections, and activity modification. All the patient's questions were answered to his satisfaction. After this discussion, the patient expressed understanding and elected to proceed with surgical intervention.     ___________________________________________________________________________   History:  Patient is a 68 y.o. male who presents today for lumbar spine.  Patient has a chronic history of low back pain.  He said he has been dealing with that for 30 years.  He said he can tolerate that pain.  However, within the last 6 months he has developed worsening pain in his low back and pain radiating into his bilateral lower extremities.  He says he feels the pain along the posterior aspect of the buttock and thighs.  He feels the pain when standing or walking.  It improves if he hunches forward or sits down.  He also feels electrical shooting type pains in the buttocks and posterior thighs.  These pains also resolved with flexion of the lumbar spine.  There is no trauma or injury that preceded the onset of pain.  He is tried several conservative treatments without any relief of his pain.   Weakness: Denies Symptoms of imbalance: Yes, when he experiences vertigo.  No other consistent unsteadiness or imbalance with gait Paresthesias and numbness: Denies Bowel or bladder incontinence: Denies Saddle anesthesia: Denies  Treatments tried: Tylenol, oral steroids, gabapentin, PT, chiropractor  Review of systems: Denies fevers and chills, night sweats, unexplained weight loss, history of cancer.  Has had pain that wakes him at night  Past medical history: GERD Vertigo HTN HLD  Allergies: Hydrocodone  Past surgical history:  L4/5 laminectomy Right total shoulder arthroplasty Revision right shoulder arthroplasty Tonsillectomy C4-6 decompression and fusion  Social  history: Denies use of nicotine product (smoking, vaping, patches, smokeless) Alcohol use: Denies Denies recreational drug use   Physical Exam:  BMI of 28.3  General: no acute distress, appears stated age Neurologic: alert, answering questions appropriately, following commands Respiratory: unlabored breathing on room air, symmetric chest rise Psychiatric: appropriate affect, normal cadence to speech   MSK (spine):  -Strength exam      Left  Right EHL    5/5  5/5 TA    5/5  5/5 GSC    5/5  5/5 Knee extension  5/5  5/5 Hip flexion   5/5  5/5  -Sensory exam    Sensation intact to light touch in L3-S1 nerve distributions of bilateral lower extremities  -Achilles DTR: 2/4 on the left, 2/4 on the right -Patellar tendon DTR: 2/4 on the left, 2/4 on the right  -Straight leg raise: Negative bilaterally -Femoral nerve stretch test: Negative bilaterally -Clonus: no beats bilaterally - Palpable DP pulses bilaterally  -Left hip exam: No pain through range of motion, negative Stinchfield, negative FABER -Right hip exam: No pain through range of motion, negative Stinchfield, negative FABER  Imaging: XRs of the lumbar spine from 09/20/2023 were independently reviewed and interpreted, showing anterior osteophyte formation at L1/2 and L2/3. Disc height loss and anterior osteophyte formation at L4/5. No evidence of instability on flexion/extension views. No fracture or dislocation seen.   MRI of the lumbar spine (on CD) from 04/11/2023 was independently reviewed and interpreted, showing DDD with modic changes at L4/5. Central and lateral recess stenosis at L2/3. Hemilaminotomy defect on the right at L4/5.    Patient name: Joseph [name] Patient MRN: [id number] Date of visit: 09/20/23   Pre-operative Scores  ODI: 56% VAS back: 5/10 VAS leg: 10/10

## 2023-09-28 ENCOUNTER — Ambulatory Visit: Admitting: Orthopedic Surgery

## 2023-10-14 ENCOUNTER — Other Ambulatory Visit: Payer: Self-pay | Admitting: Family Medicine

## 2023-10-14 ENCOUNTER — Other Ambulatory Visit (INDEPENDENT_AMBULATORY_CARE_PROVIDER_SITE_OTHER)

## 2023-10-14 ENCOUNTER — Ambulatory Visit (INDEPENDENT_AMBULATORY_CARE_PROVIDER_SITE_OTHER): Admitting: Orthopedic Surgery

## 2023-10-14 ENCOUNTER — Encounter: Payer: Self-pay | Admitting: Orthopedic Surgery

## 2023-10-14 ENCOUNTER — Encounter: Payer: Self-pay | Admitting: Family Medicine

## 2023-10-14 ENCOUNTER — Other Ambulatory Visit (INDEPENDENT_AMBULATORY_CARE_PROVIDER_SITE_OTHER): Payer: Self-pay

## 2023-10-14 DIAGNOSIS — Z96611 Presence of right artificial shoulder joint: Secondary | ICD-10-CM

## 2023-10-14 DIAGNOSIS — E782 Mixed hyperlipidemia: Secondary | ICD-10-CM | POA: Diagnosis not present

## 2023-10-14 LAB — LIPID PANEL
Cholesterol: 144 mg/dL (ref 0–200)
HDL: 45.5 mg/dL (ref >39.00)
LDL Cholesterol: 79 mg/dL (ref 0–99)
NonHDL: 98.57
Total CHOL/HDL Ratio: 3
Triglycerides: 100 mg/dL (ref 0.0–149.0)
VLDL: 20 mg/dL (ref 0.0–40.0)

## 2023-10-14 NOTE — Progress Notes (Signed)
 Post-Op Visit Note   Patient: Joseph [name]           Date of Birth: 05-14-1956           MRN: [id number] Visit Date: 10/14/2023 PCP: Joseph Mulder, DO   Assessment & Plan:  Chief Complaint:  Chief Complaint  Patient presents with   Right Shoulder - Follow-up    right reverse shoulder arthroplasty with vault reconstruction system on 07/26/2023   Visit Diagnoses:  1. History of arthroplasty of right shoulder     Plan: Joseph [name] is now 3 months out right RSA with vault reconstruction system.  He is doing well without many problems.  Scheduled for lumbar surgery in June.  He is using a pulley and accordion brace.  On examination he has range of motion of 20/80/90.  Deltoid fires.  No real pain with passive range of motion.  Plan at this time is okay to start doing some biceps and triceps weightlifting.  Would not really work on the shoulders in terms of benchpress or dumbbell flies.  I think band work is good below shoulder level.  Follow-up in 3 months with repeat radiographs.  No evidence of loosening of the baseplate at this time.  Follow-Up Instructions: No follow-ups on file.   Orders:  Orders Placed This Encounter  Procedures   XR Shoulder Right   No orders of the defined types were placed in this encounter.   Imaging: No results found.  PMFS History: Patient Active Problem List   Diagnosis Date Noted   Loosening of total shoulder replacement (HCC) 07/31/2023   S/P reverse total shoulder arthroplasty, right 07/26/2023   Medication management 05/16/2023   PICC (peripherally inserted central catheter) in place 05/16/2023   Prosthetic joint infection (HCC) 04/29/2023   OA (osteoarthritis) of shoulder 04/28/2023   S/P hardware removal 04/28/2023   Umbilical hernia without obstruction and without gangrene 07/05/2022   Insulin resistance 09/03/2019   Cubital tunnel syndrome 07/16/2019   Localized, primary osteoarthritis of shoulder region 07/16/2019    Shoulder pain 07/16/2019   Low testosterone  04/13/2018   Mixed hyperlipidemia    Bilateral sensorineural hearing loss 11/01/2017   Subjective tinnitus of right ear 11/01/2017   Hyperlipidemia 09/14/2017   History of tobacco abuse 09/14/2017   Overweight (BMI 25.0-29.9) 09/14/2017   Vertigo 07/14/2016   Arthritis of hand 02/09/2016   Right hand pain 02/09/2016   Cervical spondylosis with myelopathy 01/15/2016   Lumbar facet arthropathy 12/12/2015   Lumbar stenosis 12/12/2015   Meralgia paresthetica 12/12/2015   Neurofibroma 12/12/2015   Other intervertebral disc degeneration, lumbar region 12/12/2015   Dysphagia 10/31/2015   GERD (gastroesophageal reflux disease) 10/31/2015   Neuropathy 10/21/2015   Essential hypertension 02/11/2015   Shingles 07/10/2014   Low back pain 05/01/2014   Kidney stone 05/01/2014   Past Medical History:  Diagnosis Date   Arthritis    Complication of anesthesia    GERD (gastroesophageal reflux disease)    Heart murmur    as a child, pt states he had an ECHO about 30 years ago (1995) and it was normal   Hypertension    Mixed hyperlipidemia    Osteoarthritis    Pneumonia    PONV (postoperative nausea and vomiting)    Prediabetes    Rheumatic fever    Vertigo     Family History  Problem Relation Age of Onset   Arthritis Mother        Rheumatoid   Diabetes Mother  Arthritis Father    Hypertension Father    Diabetes Brother    Lupus Brother     Past Surgical History:  Procedure Laterality Date   BACK SURGERY  2012   JOINT REPLACEMENT Right 2013   right shoulder   SPINE SURGERY  1995   C4-5, C5-C6    SYNOVECTOMY WITH REMOVAL OF TOTAL SHOULDER Right 04/28/2023   Procedure: REMOVAL RIGHT SHOULDER IMPLANTS AND PLACEMENT OF ANTIBIOTIC SPACER;  Surgeon: Jasmine Mesi, MD;  Location: [facility] OR;  Service: Orthopedics;  Laterality: Right;   TONSILLECTOMY  1963   TOTAL SHOULDER REVISION Right 07/26/2023   Procedure: RIGHT REVERSE SHOULDER  ARTHROPLASTY, REMOVAL ANTIBIOTIC SPACER;  Surgeon: Jasmine Mesi, MD;  Location: [facility] OR;  Service: Orthopedics;  Laterality: Right;   Social History   Occupational History   Occupation: Disability    Comment: Shoulder / Back  Tobacco Use   Smoking status: Former    Current packs/day: 0.00    Average packs/day: 1.5 packs/day for 20.0 years (30.0 ttl pk-yrs)    Types: Cigarettes    Start date: 05/01/1970    Quit date: 05/01/1990    Years since quitting: 33.4   Smokeless tobacco: Never  Vaping Use   Vaping status: Never Used  Substance and Sexual Activity   Alcohol use: No    Alcohol/week: 0.0 standard drinks of alcohol   Drug use: No   Sexual activity: Yes    Partners: Female

## 2023-11-01 NOTE — Pre-Procedure Instructions (Signed)
 Surgical Instructions   Your procedure is scheduled on Tuesday, November 15, 2023. Report to [facility] Main Entrance "A" at 5:30 A.M., then check in with the Admitting office. Any questions or running late day of surgery: call [phone]  Questions prior to your surgery date: call [phone], Monday-Friday, 8am-4pm. If you experience any cold or flu symptoms such as cough, fever, chills, shortness of breath, etc. between now and your scheduled surgery, please notify us  at the above number.     Remember:  Do not eat after midnight the night before your surgery  You may drink clear liquids until 4:30 the morning of your surgery.   Clear liquids allowed are: Water , Non-Citrus Juices (without pulp), Carbonated Beverages, Clear Tea (no milk, honey, etc.), Black Coffee Only (NO MILK, CREAM OR POWDERED CREAMER of any kind), and Gatorade.  Patient Instructions  The night before surgery:  No food after midnight. ONLY clear liquids after midnight  The day of surgery (if you do NOT have diabetes):  Drink ONE (1) Pre-Surgery Clear Ensure by 4:30am the morning of surgery. Drink in one sitting. Do not sip.  This drink was given to you during your hospital  pre-op appointment visit.  Nothing else to drink after completing the  Pre-Surgery Clear Ensure.         If you have questions, please contact your surgeon's office.     Take these medicines the morning of surgery with A SIP OF WATER  : Amlodipine  (Norvasc ) Pantoprazole  (Protonix )  One week prior to surgery, STOP taking any Aspirin  (unless otherwise instructed by your surgeon) Aleve, Naproxen, Ibuprofen, Motrin, Advil, Goody's, BC's, all herbal medications, fish oil, and non-prescription vitamins. This includes our Diclofenac  Sodium topical and Meloxicam  (Mobic ).                     Do NOT Smoke (Tobacco/Vaping) for 24 hours prior to your procedure.  If you use a CPAP at night, you may bring your mask/headgear for your overnight stay.    You will be asked to remove any contacts, glasses, piercing's, hearing aid's, dentures/partials prior to surgery. Please bring cases for these items if needed.    Patients discharged the day of surgery will not be allowed to drive home, and someone needs to stay with them for 24 hours.  SURGICAL WAITING ROOM VISITATION Patients may have no more than 2 support people in the waiting area - these visitors may rotate.   Pre-op nurse will coordinate an appropriate time for 1 ADULT support person, who may not rotate, to accompany patient in pre-op.  Children under the age of 45 must have an adult with them who is not the patient and must remain in the main waiting area with an adult.  If the patient needs to stay at the hospital during part of their recovery, the visitor guidelines for inpatient rooms apply.  Please refer to the [facility] website for the visitor guidelines for any additional information.   If you received a COVID test during your pre-op visit  it is requested that you wear a mask when out in public, stay away from anyone that may not be feeling well and notify your surgeon if you develop symptoms. If you have been in contact with anyone that has tested positive in the last 10 days please notify you surgeon.      Pre-operative 5 CHG Bathing Instructions   You can play a key role in reducing the risk of infection after surgery.  Your skin needs to be as free of germs as possible. You can reduce the number of germs on your skin by washing with CHG (chlorhexidine  gluconate) soap before surgery. CHG is an antiseptic soap that kills germs and continues to kill germs even after washing.   DO NOT use if you have an allergy to chlorhexidine /CHG or antibacterial soaps. If your skin becomes reddened or irritated, stop using the CHG and notify one of our RNs at [phone].   Please shower with the CHG soap starting 4 days before surgery using the following schedule:     Please keep  in mind the following:  DO NOT shave, including legs and underarms, starting the day of your first shower.   You may shave your face at any point before/day of surgery.  Place clean sheets on your bed the day you start using CHG soap. Use a clean washcloth (not used since being washed) for each shower. DO NOT sleep with pets once you start using the CHG.   CHG Shower Instructions:  Wash your face and private area with normal soap. If you choose to wash your hair, wash first with your normal shampoo.  After you use shampoo/soap, rinse your hair and body thoroughly to remove shampoo/soap residue.  Turn the water  OFF and apply about 3 tablespoons (45 ml) of CHG soap to a CLEAN washcloth.  Apply CHG soap ONLY FROM YOUR NECK DOWN TO YOUR TOES (washing for 3-5 minutes)  DO NOT use CHG soap on face, private areas, open wounds, or sores.  Pay special attention to the area where your surgery is being performed.  If you are having back surgery, having someone wash your back for you may be helpful. Wait 2 minutes after CHG soap is applied, then you may rinse off the CHG soap.  Pat dry with a clean towel  Put on clean clothes/pajamas   If you choose to wear lotion, please use ONLY the CHG-compatible lotions that are listed below.  Additional instructions for the day of surgery: DO NOT APPLY any lotions, deodorants, cologne, or perfumes.   Do not bring valuables to the hospital. [facility] is not responsible for any belongings/valuables. Do not wear nail polish, gel polish, artificial nails, or any other type of covering on natural nails (fingers and toes) Do not wear jewelry or makeup Put on clean/comfortable clothes.  Please brush your teeth.  Ask your nurse before applying any prescription medications to the skin.     CHG Compatible Lotions   Aveeno Moisturizing lotion  Cetaphil Moisturizing Cream  Cetaphil Moisturizing Lotion  Clairol Herbal Essence Moisturizing Lotion, Dry Skin   Clairol Herbal Essence Moisturizing Lotion, Extra Dry Skin  Clairol Herbal Essence Moisturizing Lotion, Normal Skin  Curel Age Defying Therapeutic Moisturizing Lotion with Alpha Hydroxy  Curel Extreme Care Body Lotion  Curel Soothing Hands Moisturizing Hand Lotion  Curel Therapeutic Moisturizing Cream, Fragrance-Free  Curel Therapeutic Moisturizing Lotion, Fragrance-Free  Curel Therapeutic Moisturizing Lotion, Original Formula  Eucerin Daily Replenishing Lotion  Eucerin Dry Skin Therapy Plus Alpha Hydroxy Crme  Eucerin Dry Skin Therapy Plus Alpha Hydroxy Lotion  Eucerin Original Crme  Eucerin Original Lotion  Eucerin Plus Crme Eucerin Plus Lotion  Eucerin TriLipid Replenishing Lotion  Keri Anti-Bacterial Hand Lotion  Keri Deep Conditioning Original Lotion Dry Skin Formula Softly Scented  Keri Deep Conditioning Original Lotion, Fragrance Free Sensitive Skin Formula  Keri Lotion Fast Absorbing Fragrance Free Sensitive Skin Formula  Keri Lotion Fast Absorbing Softly Scented Dry Skin  Formula  Keri Original Lotion  Keri Skin Renewal Lotion Keri Silky Smooth Lotion  Keri Silky Smooth Sensitive Skin Lotion  Nivea Body Creamy Conditioning Oil  Nivea Body Extra Enriched Lotion  Nivea Body Original Lotion  Nivea Body Sheer Moisturizing Lotion Nivea Crme  Nivea Skin Firming Lotion  NutraDerm 30 Skin Lotion  NutraDerm Skin Lotion  NutraDerm Therapeutic Skin Cream  NutraDerm Therapeutic Skin Lotion  ProShield Protective Hand Cream  Provon moisturizing lotion  Please read over the following fact sheets that you were given.

## 2023-11-02 ENCOUNTER — Encounter (HOSPITAL_COMMUNITY)
Admission: RE | Admit: 2023-11-02 | Discharge: 2023-11-02 | Disposition: A | Discharge status: Home / Self Care | Source: Ambulatory Visit | Attending: Orthopedic Surgery | Admitting: Orthopedic Surgery

## 2023-11-02 ENCOUNTER — Encounter (HOSPITAL_COMMUNITY): Payer: Self-pay

## 2023-11-02 ENCOUNTER — Other Ambulatory Visit: Payer: Self-pay

## 2023-11-02 DIAGNOSIS — Z01812 Encounter for preprocedural laboratory examination: Secondary | ICD-10-CM | POA: Diagnosis not present

## 2023-11-02 DIAGNOSIS — Z01818 Encounter for other preprocedural examination: Secondary | ICD-10-CM

## 2023-11-02 LAB — BASIC METABOLIC PANEL WITH GFR
Anion gap: 4 — ABNORMAL LOW (ref 5–15)
BUN: 18 mg/dL (ref 8–23)
CO2: 27 mmol/L (ref 22–32)
Calcium: 9 mg/dL (ref 8.9–10.3)
Chloride: 109 mmol/L (ref 98–111)
Creatinine, Ser: 1.37 mg/dL — ABNORMAL HIGH (ref 0.61–1.24)
GFR, Estimated: 56 mL/min — ABNORMAL LOW (ref >60)
Glucose, Bld: 90 mg/dL (ref 70–99)
Potassium: 4.1 mmol/L (ref 3.5–5.1)
Sodium: 140 mmol/L (ref 135–145)

## 2023-11-02 LAB — SURGICAL PCR SCREEN
MRSA, PCR: NEGATIVE
Staphylococcus aureus: NEGATIVE

## 2023-11-02 LAB — CBC
HCT: 41.3 % (ref 39.0–52.0)
Hemoglobin: 13.4 g/dL (ref 13.0–17.0)
MCH: 28.2 pg (ref 26.0–34.0)
MCHC: 32.4 g/dL (ref 30.0–36.0)
MCV: 86.8 fL (ref 80.0–100.0)
Platelets: 244 10*3/uL (ref 150–400)
RBC: 4.76 MIL/uL (ref 4.22–5.81)
RDW: 13.2 % (ref 11.5–15.5)
WBC: 5.4 10*3/uL (ref 4.0–10.5)
nRBC: 0 % (ref 0.0–0.2)

## 2023-11-02 NOTE — Progress Notes (Signed)
 PCP - Dr. Dawna Etienne Cardiologist - denies  PPM/ICD - denies   Chest x-ray - 07/07/19 EKG - 04/21/23 Stress Test - denies ECHO - denies Cardiac Cath - denies  Sleep Study - denies   DM- denies  Last dose of GLP1 agonist-  n/a   ASA/Blood Thinner Instructions: n/a   ERAS Protcol - clears until 0430 PRE-SURGERY Ensure given  COVID TEST- n/a   Anesthesia review: no  Patient denies shortness of breath, fever, cough and chest pain at PAT appointment   All instructions explained to the patient, with a verbal understanding of the material. Patient agrees to go over the instructions while at home for a better understanding.  The opportunity to ask questions was provided.

## 2023-11-14 NOTE — H&P (Signed)
 Orthopedic Spine Surgery H&P Note  Assessment: Patient is a 68 y.o. male with low back pain that radiates into bilateral lower extremities, consistent with neurogenic claudication   Plan: -Out of bed as tolerated, activity as tolerated, no brace -Covered the risks of surgery one more time with the patient and patient elected to proceed with planned surgery -Written consent verified -Hold anticoagulation in anticipation of surgery -Ancef  and TXA on all to OR -NPO for procedure -Site marked -To OR when ready  Risks discussed this morning included but were not limited to: iatrogenic instability, dural tear, nerve root injury, paralysis, persistent pain, infection, bleeding, heart attack, death, fracture, and need for additional procedures. All patient questions were answered to his satisfaction. He reiterated his desire to proceed with surgical management.   ___________________________________________________________________________  Chief Complaint: low back pain that radiates into bilateral lower extremities  History: Patient is 68 y.o. male who has been previously seen in the office for low back pain that radiates into the bilateral buttock and posterior thighs. His symptoms and work up were consistent with neurogenic claudication. His symptoms failed to improve with conservative treatment so operative management was discussed at the last office visit. The patient presents today with no changes in his symptoms since the last office visit. See previous office note for further details.    Review of systems: General: denies fevers and chills, myalgias Neurologic: denies recent changes in vision, slurred speech Abdomen: denies nausea, vomiting, hematemesis Respiratory: denies cough, shortness of breath  Past medical history: GERD Vertigo HTN HLD   Allergies: Hydrocodone   Past surgical history:  L4/5 laminectomy Right total shoulder arthroplasty Revision right shoulder  arthroplasty Tonsillectomy C4-6 decompression and fusion   Social history: Denies use of nicotine product (smoking, vaping, patches, smokeless) Alcohol use: Denies Denies recreational drug use  Family history: -reviewed and not pertinent to lumbar stenosis with neurogenic claudication   Physical Exam:  General: no acute distress, appears stated age Neurologic: alert, answering questions appropriately, following commands Cardiovascular: regular rate, no cyanosis Respiratory: unlabored breathing on room air, symmetric chest rise Psychiatric: appropriate affect, normal cadence to speech   MSK (spine):  -Strength exam      Left  Right  EHL    5/5  5/5 TA    5/5  5/5 GSC    5/5  5/5 Knee extension  5/5  5/5 Knee flexion   5/5  5/5 Hip flexion   5/5  5/5  -Sensory exam    Sensation intact to light touch in L2-S1 nerve distributions of bilateral lower extremities  -Palpable DP pulse bilaterally   Patient name: Joseph [name] Patient MRN: [id number] Date: 11/15/2023

## 2023-11-14 NOTE — Anesthesia Preprocedure Evaluation (Addendum)
 Anesthesia Evaluation  Patient identified by MRN, date of birth, ID band Patient awake    Reviewed: Allergy & Precautions, NPO status , Patient's Chart, lab work & pertinent test results  History of Anesthesia Complications (+) PONV and history of anesthetic complications  Airway Mallampati: II  TM Distance: >3 FB Neck ROM: Full    Dental no notable dental hx. (+) Partial Upper, Missing, Dental Advisory Given,    Pulmonary former smoker   Pulmonary exam normal breath sounds clear to auscultation       Cardiovascular hypertension, Pt. on medications Normal cardiovascular exam Rhythm:Regular Rate:Normal     Neuro/Psych  Neuromuscular disease    GI/Hepatic ,GERD  Medicated and Controlled,,  Endo/Other    Renal/GU Renal diseaseLab Results      Component                Value               Date                         K                        4.1                 11/02/2023                CO2                      27                  11/02/2023                BUN                      18                  11/02/2023                CREATININE               1.37 (H)            11/02/2023                GFRNONAA                 56 (L)              11/02/2023                   Musculoskeletal  (+) Arthritis ,    Abdominal   Peds  Hematology Lab Results      Component                Value               Date                      WBC                      5.4                 11/02/2023                HGB  13.4                11/02/2023                HCT                      41.3                11/02/2023                MCV                      86.8                11/02/2023                PLT                      244                 11/02/2023              Anesthesia Other Findings Pt S/P 2 R shoulder Surgeries. States limited mobility on R side.   Reproductive/Obstetrics                              Anesthesia Physical Anesthesia Plan  ASA: 2  Anesthesia Plan: General   Post-op Pain Management: Ketamine  IV*, Tylenol  PO (pre-op)* and Precedex   Induction: Intravenous  PONV Risk Score and Plan: Treatment may vary due to age or medical condition, Midazolam  and Ondansetron   Airway Management Planned: Oral ETT  Additional Equipment: None  Intra-op Plan:   Post-operative Plan: Extubation in OR  Informed Consent: I have reviewed the patients History and Physical, chart, labs and discussed the procedure including the risks, benefits and alternatives for the proposed anesthesia with the patient or authorized representative who has indicated his/her understanding and acceptance.     Dental advisory given  Plan Discussed with: CRNA and Surgeon  Anesthesia Plan Comments: (2 x18g iv)       Anesthesia Quick Evaluation

## 2023-11-15 ENCOUNTER — Ambulatory Visit (HOSPITAL_COMMUNITY)

## 2023-11-15 ENCOUNTER — Other Ambulatory Visit: Payer: Self-pay

## 2023-11-15 ENCOUNTER — Ambulatory Visit (HOSPITAL_COMMUNITY): Payer: Self-pay | Admitting: Vascular Surgery

## 2023-11-15 ENCOUNTER — Encounter (HOSPITAL_COMMUNITY): Payer: Self-pay | Admitting: Orthopedic Surgery

## 2023-11-15 ENCOUNTER — Ambulatory Visit (HOSPITAL_COMMUNITY)
Admission: RE | Admit: 2023-11-15 | Discharge: 2023-11-15 | Disposition: A | Discharge status: Home / Self Care | Source: Ambulatory Visit | Attending: Orthopedic Surgery | Admitting: Orthopedic Surgery

## 2023-11-15 ENCOUNTER — Encounter (HOSPITAL_COMMUNITY)
Admission: RE | Disposition: A | Discharge status: Home / Self Care | Payer: Self-pay | Source: Ambulatory Visit | Attending: Orthopedic Surgery

## 2023-11-15 ENCOUNTER — Other Ambulatory Visit (HOSPITAL_COMMUNITY): Payer: Self-pay

## 2023-11-15 DIAGNOSIS — Z0189 Encounter for other specified special examinations: Secondary | ICD-10-CM | POA: Diagnosis not present

## 2023-11-15 DIAGNOSIS — K219 Gastro-esophageal reflux disease without esophagitis: Secondary | ICD-10-CM | POA: Diagnosis not present

## 2023-11-15 DIAGNOSIS — Z87891 Personal history of nicotine dependence: Secondary | ICD-10-CM | POA: Diagnosis not present

## 2023-11-15 DIAGNOSIS — M48062 Spinal stenosis, lumbar region with neurogenic claudication: Secondary | ICD-10-CM | POA: Diagnosis present

## 2023-11-15 DIAGNOSIS — M1990 Unspecified osteoarthritis, unspecified site: Secondary | ICD-10-CM | POA: Insufficient documentation

## 2023-11-15 DIAGNOSIS — I10 Essential (primary) hypertension: Secondary | ICD-10-CM | POA: Insufficient documentation

## 2023-11-15 DIAGNOSIS — Z01818 Encounter for other preprocedural examination: Secondary | ICD-10-CM

## 2023-11-15 HISTORY — PX: DECOMPRESSIVE LUMBAR LAMINECTOMY LEVEL 1: SHX5791

## 2023-11-15 SURGERY — DECOMPRESSIVE LUMBAR LAMINECTOMY LEVEL 1
Anesthesia: General

## 2023-11-15 MED ORDER — ONDANSETRON HCL 4 MG/2ML IJ SOLN
INTRAMUSCULAR | Status: AC
Start: 2023-11-15 — End: ?
  Filled 2023-11-15: qty 2

## 2023-11-15 MED ORDER — CEFAZOLIN SODIUM-DEXTROSE 2-4 GM/100ML-% IV SOLN
2.0000 g | INTRAVENOUS | Status: AC
  Administered 2023-11-15: 2 g via INTRAVENOUS

## 2023-11-15 MED ORDER — ONDANSETRON HCL 4 MG/2ML IJ SOLN
INTRAMUSCULAR | Status: DC | PRN
  Administered 2023-11-15: 4 mg via INTRAVENOUS

## 2023-11-15 MED ORDER — OXYCODONE HCL 5 MG PO TABS: 5.0000 mg | ORAL_TABLET | Freq: Once | ORAL | Status: DC | PRN

## 2023-11-15 MED ORDER — ROCURONIUM BROMIDE 10 MG/ML (PF) SYRINGE
PREFILLED_SYRINGE | INTRAVENOUS | Status: AC
Start: 2023-11-15 — End: ?
  Filled 2023-11-15: qty 10

## 2023-11-15 MED ORDER — ACETAMINOPHEN 500 MG PO TABS
1000.0000 mg | ORAL_TABLET | Freq: Three times a day (TID) | ORAL | 0 refills | Status: AC
  Filled 2023-11-15: qty 126, 21d supply, fill #0

## 2023-11-15 MED ORDER — AMISULPRIDE (ANTIEMETIC) 5 MG/2ML IV SOLN
10.0000 mg | Freq: Once | INTRAVENOUS | Status: AC | PRN
  Administered 2023-11-15: 10 mg via INTRAVENOUS

## 2023-11-15 MED ORDER — GLYCOPYRROLATE 0.2 MG/ML IJ SOLN
INTRAMUSCULAR | Status: DC | PRN
Start: 2023-11-15 — End: 2023-11-15
  Administered 2023-11-15: .2 mg via INTRAVENOUS

## 2023-11-15 MED ORDER — ONDANSETRON HCL 4 MG/2ML IJ SOLN: 4.0000 mg | Freq: Once | INTRAMUSCULAR | Status: DC | PRN

## 2023-11-15 MED ORDER — MIDAZOLAM HCL 2 MG/2ML IJ SOLN
INTRAMUSCULAR | Status: AC
  Filled 2023-11-15: qty 2

## 2023-11-15 MED ORDER — PHENYLEPHRINE 80 MCG/ML (10ML) SYRINGE FOR IV PUSH (FOR BLOOD PRESSURE SUPPORT)
PREFILLED_SYRINGE | INTRAVENOUS | Status: AC
  Filled 2023-11-15: qty 10

## 2023-11-15 MED ORDER — ACETAMINOPHEN 10 MG/ML IV SOLN
1000.0000 mg | Freq: Once | INTRAVENOUS | Status: DC | PRN
  Administered 2023-11-15: 1000 mg via INTRAVENOUS

## 2023-11-15 MED ORDER — TRANEXAMIC ACID-NACL 1000-0.7 MG/100ML-% IV SOLN
1000.0000 mg | INTRAVENOUS | Status: AC
  Administered 2023-11-15: 1000 mg via INTRAVENOUS

## 2023-11-15 MED ORDER — HYDROMORPHONE HCL 2 MG PO TABS
1.0000 mg | ORAL_TABLET | ORAL | 0 refills | Status: DC | PRN
  Filled 2023-11-15 – 2023-11-18 (×2): qty 30, 5d supply, fill #0

## 2023-11-15 MED ORDER — LIDOCAINE 2% (20 MG/ML) 5 ML SYRINGE
INTRAMUSCULAR | Status: DC | PRN
  Administered 2023-11-15: 100 mg via INTRAVENOUS

## 2023-11-15 MED ORDER — EPHEDRINE 5 MG/ML INJ
INTRAVENOUS | Status: AC
  Filled 2023-11-15: qty 5

## 2023-11-15 MED ORDER — CHLORHEXIDINE GLUCONATE 0.12 % MT SOLN
OROMUCOSAL | Status: AC
  Administered 2023-11-15: 15 mL via OROMUCOSAL
  Filled 2023-11-15: qty 15

## 2023-11-15 MED ORDER — TAMSULOSIN HCL 0.4 MG PO CAPS
0.4000 mg | ORAL_CAPSULE | Freq: Every day | ORAL | 0 refills | Status: AC
  Filled 2023-11-15: qty 20, 20d supply, fill #0

## 2023-11-15 MED ORDER — DEXAMETHASONE SODIUM PHOSPHATE 10 MG/ML IJ SOLN
10.0000 mg | Freq: Once | INTRAMUSCULAR | Status: AC
  Administered 2023-11-15: 10 mg via INTRAVENOUS

## 2023-11-15 MED ORDER — OXYCODONE HCL 5 MG PO TABS
ORAL_TABLET | ORAL | Status: AC
  Filled 2023-11-15: qty 1

## 2023-11-15 MED ORDER — SUCCINYLCHOLINE CHLORIDE 200 MG/10ML IV SOSY
PREFILLED_SYRINGE | INTRAVENOUS | Status: AC
  Filled 2023-11-15: qty 10

## 2023-11-15 MED ORDER — FENTANYL CITRATE (PF) 250 MCG/5ML IJ SOLN
INTRAMUSCULAR | Status: AC
  Filled 2023-11-15: qty 5

## 2023-11-15 MED ORDER — PROPOFOL 10 MG/ML IV BOLUS
INTRAVENOUS | Status: AC
  Filled 2023-11-15: qty 20

## 2023-11-15 MED ORDER — PHENYLEPHRINE HCL-NACL 20-0.9 MG/250ML-% IV SOLN
INTRAVENOUS | Status: DC | PRN
  Administered 2023-11-15: 30 ug/min via INTRAVENOUS

## 2023-11-15 MED ORDER — POLYETHYLENE GLYCOL 3350 17 GM/SCOOP PO POWD
17.0000 g | Freq: Every day | ORAL | 0 refills | Status: AC
Start: 2023-11-15 — End: 2023-11-29
  Filled 2023-11-15: qty 238, 14d supply, fill #0

## 2023-11-15 MED ORDER — METHOCARBAMOL 500 MG PO TABS
500.0000 mg | ORAL_TABLET | Freq: Four times a day (QID) | ORAL | 0 refills | Status: AC
  Filled 2023-11-15: qty 40, 10d supply, fill #0

## 2023-11-15 MED ORDER — LACTATED RINGERS IV SOLN
INTRAVENOUS | Status: DC | PRN
Start: 2023-11-15 — End: 2023-11-15

## 2023-11-15 MED ORDER — LIDOCAINE 2% (20 MG/ML) 5 ML SYRINGE
INTRAMUSCULAR | Status: AC
  Filled 2023-11-15: qty 5

## 2023-11-15 MED ORDER — OXYCODONE HCL 5 MG/5ML PO SOLN: 5.0000 mg | Freq: Once | ORAL | Status: DC | PRN

## 2023-11-15 MED ORDER — EPHEDRINE SULFATE-NACL 50-0.9 MG/10ML-% IV SOSY
PREFILLED_SYRINGE | INTRAVENOUS | Status: DC | PRN
  Administered 2023-11-15 (×3): 5 mg via INTRAVENOUS

## 2023-11-15 MED ORDER — CEFAZOLIN SODIUM-DEXTROSE 2-4 GM/100ML-% IV SOLN
INTRAVENOUS | Status: AC
  Filled 2023-11-15: qty 100

## 2023-11-15 MED ORDER — AMISULPRIDE (ANTIEMETIC) 5 MG/2ML IV SOLN
INTRAVENOUS | Status: AC
  Filled 2023-11-15: qty 4

## 2023-11-15 MED ORDER — MIDAZOLAM HCL 2 MG/2ML IJ SOLN
INTRAMUSCULAR | Status: DC | PRN
  Administered 2023-11-15: 2 mg via INTRAVENOUS

## 2023-11-15 MED ORDER — SUGAMMADEX SODIUM 200 MG/2ML IV SOLN
INTRAVENOUS | Status: DC | PRN
  Administered 2023-11-15: 200 mg via INTRAVENOUS

## 2023-11-15 MED ORDER — 0.9 % SODIUM CHLORIDE (POUR BTL) OPTIME
TOPICAL | Status: DC | PRN
  Administered 2023-11-15: 1000 mL

## 2023-11-15 MED ORDER — VANCOMYCIN HCL 1000 MG IV SOLR
INTRAVENOUS | Status: AC
Start: 2023-11-15 — End: ?
  Filled 2023-11-15: qty 20

## 2023-11-15 MED ORDER — DEXAMETHASONE SODIUM PHOSPHATE 10 MG/ML IJ SOLN
INTRAMUSCULAR | Status: AC
  Filled 2023-11-15: qty 1

## 2023-11-15 MED ORDER — PHENYLEPHRINE 80 MCG/ML (10ML) SYRINGE FOR IV PUSH (FOR BLOOD PRESSURE SUPPORT)
PREFILLED_SYRINGE | INTRAVENOUS | Status: DC | PRN
  Administered 2023-11-15 (×2): 80 ug via INTRAVENOUS

## 2023-11-15 MED ORDER — ACETAMINOPHEN 10 MG/ML IV SOLN
INTRAVENOUS | Status: AC
  Filled 2023-11-15: qty 100

## 2023-11-15 MED ORDER — THROMBIN 20000 UNITS EX SOLR
CUTANEOUS | Status: AC
  Filled 2023-11-15: qty 20000

## 2023-11-15 MED ORDER — KETAMINE HCL 50 MG/5ML IJ SOSY
PREFILLED_SYRINGE | INTRAMUSCULAR | Status: AC
  Filled 2023-11-15: qty 5

## 2023-11-15 MED ORDER — TRANEXAMIC ACID-NACL 1000-0.7 MG/100ML-% IV SOLN: 1000.0000 mg | Freq: Once | INTRAVENOUS | Status: DC

## 2023-11-15 MED ORDER — POVIDONE-IODINE 10 % EX SWAB: 2.0000 | Freq: Once | CUTANEOUS | Status: DC

## 2023-11-15 MED ORDER — ROCURONIUM BROMIDE 10 MG/ML (PF) SYRINGE
PREFILLED_SYRINGE | INTRAVENOUS | Status: DC | PRN
  Administered 2023-11-15: 20 mg via INTRAVENOUS
  Administered 2023-11-15: 60 mg via INTRAVENOUS
  Administered 2023-11-15: 20 mg via INTRAVENOUS

## 2023-11-15 MED ORDER — SENNA 8.6 MG PO TABS
1.0000 | ORAL_TABLET | Freq: Two times a day (BID) | ORAL | 0 refills | Status: AC
  Filled 2023-11-15: qty 28, 14d supply, fill #0

## 2023-11-15 MED ORDER — HYDROMORPHONE HCL 1 MG/ML IJ SOLN: 0.2500 mg | INTRAMUSCULAR | Status: DC | PRN

## 2023-11-15 MED ORDER — TRANEXAMIC ACID-NACL 1000-0.7 MG/100ML-% IV SOLN
INTRAVENOUS | Status: AC
  Filled 2023-11-15: qty 100

## 2023-11-15 MED ORDER — PROPOFOL 10 MG/ML IV BOLUS
INTRAVENOUS | Status: DC | PRN
  Administered 2023-11-15: 200 mg via INTRAVENOUS

## 2023-11-15 MED ORDER — DEXMEDETOMIDINE HCL IN NACL 80 MCG/20ML IV SOLN
INTRAVENOUS | Status: DC | PRN
  Administered 2023-11-15: 8 ug via INTRAVENOUS
  Administered 2023-11-15: 4 ug via INTRAVENOUS
  Administered 2023-11-15 (×2): 8 ug via INTRAVENOUS

## 2023-11-15 MED ORDER — FENTANYL CITRATE (PF) 250 MCG/5ML IJ SOLN
INTRAMUSCULAR | Status: DC | PRN
  Administered 2023-11-15: 50 ug via INTRAVENOUS
  Administered 2023-11-15: 100 ug via INTRAVENOUS
  Administered 2023-11-15 (×2): 50 ug via INTRAVENOUS

## 2023-11-15 MED ORDER — DEXMEDETOMIDINE HCL IN NACL 80 MCG/20ML IV SOLN
INTRAVENOUS | Status: AC
  Filled 2023-11-15: qty 20

## 2023-11-15 MED ORDER — GLYCOPYRROLATE PF 0.2 MG/ML IJ SOSY
PREFILLED_SYRINGE | INTRAMUSCULAR | Status: AC
  Filled 2023-11-15: qty 1

## 2023-11-15 MED ORDER — CHLORHEXIDINE GLUCONATE 0.12 % MT SOLN
15.0000 mL | OROMUCOSAL | Status: AC
  Filled 2023-11-15: qty 15

## 2023-11-15 MED ORDER — KETAMINE HCL 10 MG/ML IJ SOLN
INTRAMUSCULAR | Status: DC | PRN
Start: 2023-11-15 — End: 2023-11-15
  Administered 2023-11-15 (×2): 25 mg via INTRAVENOUS

## 2023-11-15 SURGICAL SUPPLY — 38 items
BENZOIN TINCTURE AMPULE (MISCELLANEOUS) ×1 IMPLANT
BENZOIN TINCTURE PRP APPL 2/3 (GAUZE/BANDAGES/DRESSINGS) ×1 IMPLANT
BUR MATCHSTICK NEURO 3.0 LAGG (BURR) ×1 IMPLANT
CANISTER SUCTION 3000ML PPV (SUCTIONS) ×1 IMPLANT
COVER MAYO STAND STRL (DRAPES) ×3 IMPLANT
COVER SURGICAL LIGHT HANDLE (MISCELLANEOUS) ×1 IMPLANT
DERMABOND ADVANCED .7 DNX12 (GAUZE/BANDAGES/DRESSINGS) IMPLANT
DRAIN HEMOVAC 7FR (DRAIN) IMPLANT
DRAPE C-ARM 42X72 X-RAY (DRAPES) ×1 IMPLANT
DRAPE UTILITY XL STRL (DRAPES) ×2 IMPLANT
DRESSING MEPILEX FLEX 4X4 (GAUZE/BANDAGES/DRESSINGS) ×1 IMPLANT
DRSG MEPILEX POST OP 4X8 (GAUZE/BANDAGES/DRESSINGS) ×1 IMPLANT
DRSG TEGADERM 4X4.75 (GAUZE/BANDAGES/DRESSINGS) ×3 IMPLANT
DURAPREP 26ML APPLICATOR (WOUND CARE) ×1 IMPLANT
ELECT PENCIL ROCKER SW 15FT (MISCELLANEOUS) ×1 IMPLANT
ELECTRODE REM PT RTRN 9FT ADLT (ELECTROSURGICAL) ×1 IMPLANT
GAUZE SPONGE 4X4 12PLY STRL (GAUZE/BANDAGES/DRESSINGS) ×1 IMPLANT
GLOVE BIO SURGEON STRL SZ7.5 (GLOVE) ×1 IMPLANT
GLOVE INDICATOR 7.5 STRL GRN (GLOVE) ×1 IMPLANT
GOWN STRL SURGICAL XL XLNG (GOWN DISPOSABLE) ×1 IMPLANT
KIT BASIN OR (CUSTOM PROCEDURE TRAY) ×1 IMPLANT
KIT POSITION SURG JACKSON T1 (MISCELLANEOUS) ×1 IMPLANT
KIT TURNOVER KIT B (KITS) ×1 IMPLANT
NS IRRIG 1000ML POUR BTL (IV SOLUTION) ×1 IMPLANT
PACK LAMINECTOMY ORTHO (CUSTOM PROCEDURE TRAY) ×1 IMPLANT
PATTIES SURGICAL .5 X.5 (GAUZE/BANDAGES/DRESSINGS) IMPLANT
SPONGE SURGIFOAM ABS GEL 100 (HEMOSTASIS) ×1 IMPLANT
SUCTION TUBE FRAZIER 10FR DISP (SUCTIONS) ×1 IMPLANT
SUT BONE WAX W31G (SUTURE) ×1 IMPLANT
SUT MNCRL AB 3-0 PS2 27 (SUTURE) ×1 IMPLANT
SUT MNCRL+ AB 3-0 CT1 36 (SUTURE) ×1 IMPLANT
SUT VIC AB 0 CT1 18XCR BRD8 (SUTURE) ×1 IMPLANT
SUT VIC AB 2-0 CT1 18 (SUTURE) ×1 IMPLANT
SUT VIC AB 2-0 CT2 18 VCP726D (SUTURE) ×1 IMPLANT
TOWEL GREEN STERILE (TOWEL DISPOSABLE) ×1 IMPLANT
TOWEL GREEN STERILE FF (TOWEL DISPOSABLE) ×1 IMPLANT
TUBING FEATHERFLOW (TUBING) ×1 IMPLANT
WATER STERILE IRR 1000ML POUR (IV SOLUTION) ×1 IMPLANT

## 2023-11-15 NOTE — Progress Notes (Signed)
 Orthopedic Surgery Post-operative Progress Note  Assessment: Patient is a 68 y.o. male who is now s/p L2/3 laminectomy   Plan: -Operative plans complete -Drains: none -Out of bed as tolerated, no brace -No bending/lifting/twisting greater than 10 pounds -Pain control -Regular diet -No chemoprophylaxis for dvt or antiplatelets for 72 hours after surgery -Anticipate discharge to home from PACU  ___________________________________________________________________________   Subjective: No acute events since surgery. Recovering in PACU. Pain controlled. Denies numbness and paresthesias.   Objective:  General: no acute distress, appropriate affect Neurologic: drowsy, answering questions appropriately, following commands Respiratory: unlabored breathing on room air Skin: dressing clear/dry/intact  MSK (spine):  -Strength exam      Right  Left  EHL    5/5  5/5 TA    5/5  5/5 GSC    5/5  5/5 Knee extension  5/5  5/5 Hip flexion   5/5  5/5  -Sensory exam    Sensation intact to light touch in L2-S1 nerve distributions of bilateral lower extremities   Patient name: Joseph [name] Patient MRN: [id number] Date: 11/15/23

## 2023-11-15 NOTE — Transfer of Care (Signed)
 Immediate Anesthesia Transfer of Care Note  Patient: Joseph [name]  Procedure(s) Performed: DECOMPRESSIVE LUMBAR LAMINECTOMY LEVEL 1  Patient Location: PACU  Anesthesia Type:General  Level of Consciousness: drowsy  Airway & Oxygen Therapy: Patient connected to face mask oxygen  Post-op Assessment: Report given to RN and Post -op Vital signs reviewed and stable  Post vital signs: Reviewed and stable  Last Vitals:  Vitals Value Taken Time  BP 126/79 11/15/23 1106  Temp    Pulse 72 11/15/23 1109  Resp 15 11/15/23 1109  SpO2 100 % 11/15/23 1109  Vitals shown include unfiled device data.  Last Pain:  Vitals:   11/15/23 0608  TempSrc:   PainSc: 6       Patients Stated Pain Goal: 0 (11/15/23 1610)  Complications: No notable events documented.

## 2023-11-15 NOTE — Anesthesia Postprocedure Evaluation (Signed)
 Anesthesia Post Note  Patient: Alec Huntington  Procedure(s) Performed: DECOMPRESSIVE LUMBAR LAMINECTOMY LEVEL 1     Patient location during evaluation: PACU Anesthesia Type: General Level of consciousness: awake and alert Pain management: pain level controlled Vital Signs Assessment: post-procedure vital signs reviewed and stable Respiratory status: spontaneous breathing, nonlabored ventilation, respiratory function stable and patient connected to nasal cannula oxygen Cardiovascular status: blood pressure returned to baseline and stable Postop Assessment: no apparent nausea or vomiting Anesthetic complications: no  No notable events documented.  Last Vitals:  Vitals:   11/15/23 1315 11/15/23 1330  BP: 115/82 122/81  Pulse: 70 71  Resp: 11 14  Temp:  (!) 36.4 C  SpO2: 99% 97%    Last Pain:  Vitals:   11/15/23 1330  TempSrc:   PainSc: Asleep                 Rosalita Combe

## 2023-11-15 NOTE — Discharge Instructions (Signed)
 Orthopedic Surgery Discharge Instructions  Patient name: Joseph [name] Procedure Performed: L2/3 laminectomy Date of Surgery: 11/15/2023 Surgeon: Colette Davies, MD  Pre-operative Diagnosis: lumbar stenosis with neurogenic claudication Post-operative Diagnosis: same as above  Discharge Date: 11/15/2023 Discharged to: home Discharge Condition: stable  Activity: You should refrain from bending, lifting, or twisting with objects greater than ten pounds until six weeks after surgery. You are encouraged to walk as much as desired. You can perform household activities such as cleaning dishes, doing laundry, vacuuming, etc. as long as the ten-pound restriction is followed. You do not need to wear a brace during the post-operative period.   Incision Care: Your incision site has a dressing over it. That dressing should remain in place and dry at all times for a total of one week after surgery. After one week, you can remove the dressing. Underneath the dressing, you will find skin glue. You should leave the skin glue in place. It will fall off with time. Do not pick, rub, or scrub at it. Do not put cream or lotion over the surgical area. After one week and once the dressing is off, it is okay to let soap and water  run over your incision. Again, do not pick, scrub, or rub at the skin glue when bathing. Do not submerge (e.g., take a bath, swim, go in a hot tub, etc.) until six weeks after surgery. There may be some bloody drainage from the incision into the dressing after surgery. This is normal. You do not need to replace the dressing. Continue to leave it in place for the one week as instructed above. Should the dressing become saturated with blood or drainage, please call the office for further instructions.   Medications: You have been prescribed dilaudid . This is a narcotic pain medication and should only be taken as prescribed. You should not drink alcohol or operate heavy machinery (including driving)  while taking this medication. The dilaudid  can cause constipation as a side effect. For that reason, you have been prescribed senna and miralax. These are both laxatives. You do not need to take this medication if you develop diarrhea. Should you remain constipated even while taking these medications, please increase the dose of miralax to twice daily. Tylenol  has been prescribed to be taken every 8 hours, which will give you additional pain relief. Robaxin  is a muscle relaxer that has been prescribed to you for muscle spasm type pain. Take this medication as needed.   You can use over-the-counter NSAIDs (ibuprofen, Aleve, Celebrex , naproxen, meloxicam , etc.) for additional pain relief after this surgery starting 72 hours after surgery. These medications are safe to take with the Tylenol  you have been prescribed. You should not take these medications if you have or have had kidney problems or gastrointestinal ulcers. Take these medications as instructed on the packaging.   In order to set expectations for opioid prescriptions, you will only be prescribed opioids for a total of six weeks after surgery and, at two-weeks after surgery, your opioid prescription will start to tapered (decreased dosage and number of pills). If you have ongoing need for opioid medication six weeks after surgery, you will be referred to pain management. If you are already established with a provider that is giving you opioid medications, you should schedule an appointment with them for six weeks after surgery if you feel you are going to need another prescription. State law only allows for opioid prescriptions one week at a time. If you are running out of opioid  medication near the end of the week, please call the office during business hours before running out so I can send you another prescription.   You may resume any home blood thinners (warfarin, lovenox, apixaban, plavix, xarelto, etc) 72 hours after your surgery. Take these  medications as they were previously prescribed.  Driving: You should not drive while taking narcotic pain medications. You should start getting back to driving slowly and you may want to try driving in a parking lot before doing anything more.   Diet: You are safe to resume your regular diet after surgery.   Reasons to Call the Office After Surgery: You should feel free to call the office with any concerns or questions you have in the post-operative period, but you should definitely notify the office if you develop: -shortness of breath, chest pain, or trouble breathing -excessive bleeding, drainage, redness, or swelling around the surgical site -fevers, chills, or pain that is getting worse with each passing day -persistent nausea or vomiting -new weakness in either leg -new or worsening numbness or tingling in either leg -numbness in the groin, bowel or bladder incontinence -other concerns about your surgery  Follow Up Appointments: You should have an office appointment scheduled for approximately two weeks after surgery. If you do not remember when this appointment is or do not already have it scheduled, please call the office to schedule.   Office Information:  -Colette Davies, MD -Phone number: [phone] -Address: [address]       [place], [place] [address]

## 2023-11-15 NOTE — Anesthesia Procedure Notes (Addendum)
 Procedure Name: Intubation Date/Time: 11/15/2023 7:46 AM  Performed by: Rosalita Combe, MDPre-anesthesia Checklist: Patient identified, Emergency Drugs available, Suction available, Patient being monitored and Timeout performed Patient Re-evaluated:Patient Re-evaluated prior to induction Oxygen Delivery Method: Circle system utilized Preoxygenation: Pre-oxygenation with 100% oxygen Induction Type: IV induction Ventilation: Mask ventilation with difficulty, Two handed mask ventilation required and Oral airway inserted - appropriate to patient size Laryngoscope Size: Mac and 4 Grade View: Grade II Tube type: Oral Tube size: 7.5 mm Number of attempts: 1 Airway Equipment and Method: Stylet and Oral airway Placement Confirmation: ETT inserted through vocal cords under direct vision, positive ETCO2 and breath sounds checked- equal and bilateral Secured at: 22 cm Tube secured with: Tape Dental Injury: Teeth and Oropharynx as per pre-operative assessment  Future Recommendations: Recommend- induction with short-acting agent, and alternative techniques readily available Comments: Atraumatic placement

## 2023-11-15 NOTE — Op Note (Signed)
 Orthopedic Spine Surgery Operative Report  Procedure: L2/3 lumbar laminectomy  Modifier: none  Date of procedure: 11/15/2023  Patient name: Joseph [name] MRN: [id number] DOB: 1956/02/07  Surgeon: Colette Davies, MD Assistant: none Pre-operative diagnosis: lumbar stenosis, neurogenic claudication Post-operative diagnosis: same as above Findings: L2/3 hypertrophic facets and thickened ligamentum flavum  Specimens: none Anesthesia: general EBL: 75cc Complications: none Pre-incision antibiotic: ancef  TXA was given prior to incision as well  Implants: none   Indication for procedure: Patient is a 68 y.o. male who presented to the office with symptoms consistent with neurogenic claudication. The patient had tried conservative treatments that did not provide any lasting relief. As result, operative management was discussed. The pre-operative MRI showed stenosis at L2/3 so a L2/3 laminectomy was presented as a treatment option. The risks including but not limited to iatrogenic instability, dural tear, nerve root injury, paralysis, persistent pain, infection, bleeding, heart attack, death,, fracture, dvt/pe, and need for additional procedures were discussed with the patient. The benefit of the surgery would be relief of the patient's radiating leg pain. The alternatives to surgical management were covered with the patient and included continued monitoring, physical therapy, over-the-counter pain medications, ambulatory aids, and activity modification. All the patient's questions were answered to his satisfaction. After this discussion, the patient expressed understanding and elected to proceed with surgical intervention.   Procedure Description: The patient was met in the pre-operative holding area. The patient's identity and consent were verified. The operative site was marked. The patient's remaining questions about the surgery were answered. The patient was brought back to the operating room.  General anesthesia was induced and an endotracheal tube was placed by the anesthesia staff. The patient was transferred to the prone [place] table in the prone position. All bony prominences were well padded. The head of the bed was slightly elevated and the eyes were free from compression by the face pillow. The surgical area was cleansed with a CHX scrub brush and alcohol. Fluoroscopy was then brought in to check rotation on the AP image and to mark the levels on the lateral image. The patient's skin was then prepped and draped in a standard, sterile fashion. A time out was performed that identified the patient, the procedure, and the operative levels. All team members agreed with what was stated in the time out.   A midline incision over the spinous processes of the previously marked levels was made and sharp dissection was continued down through the skin and dermis. Electrocautery was then used to continue the midline dissection down to the level of the spinous process. Subperiosteal dissection was performed using electrocautery to expose the lamina out lateral to the facet joint capsule. Care was taken to not violate the facet joint capsules. A lateral fluoroscopic image was taken to confirm the level. Subperiosteal dissection with electrocautery was then done to expose all the lamina and pars interarticularis of L2 and L3. Again, care was taken to avoid disruption of the facet capsules.    A rongeur was used to remove the spinous processes and interspinous ligaments between the L1/2 interspinous ligament to the cranial portion of the L3 spinous process. Bone wax was used to obtain hemostasis at the bleeding bony surfaces. A high-speed burr was used to thin the lamina at L2 and the cranial portion of L3. Above the level of the ligamentum, the lamina was thinned with the burr to the approximate level of the ligamentum. Care was taken to leave at least 8mm of pars interarticularis on each  side. A series of  Kerrison rongeurs were used to remove the remaining lamina and ligamentum overlying the thecal sac. A woodsen was then used to protect the thecal sac as kerrisons were used to remove the medial portion of the L2/3 facets bilaterally.   A woodsen was placed into the laminectomy site to palpate for any remaining areas of stenosis. Once it was confirmed with the woodsen that decompression had been completed from the medial pedicle wall to the contralateral medial pedicle wall and from the pedicle of L2 to the pedicle of L3, decompression was determined to be completed.   The wound was copiously irrigated with sterile saline. 1g of vancomycin  powder was placed into the wound. The fascia was reapproximated with 0 vicryl suture. The subcutaneous fat was reapproximated with 0 vicryl suture. The deep dermal layer was reapproximated with 2-0 vicryl. The skin as closed with a 3-0 running monocryl. All counts were correct at the end of the case. Dermabond was applied over the skin. An island dressing was placed over the wound. The patient was transferred back to a bed and brought to the post-anesthesia care unit by anesthesia staff in stable condition.  Post-operative plan: The patient will recover in the post-anesthesia care unit and then go home once his pain is under control, he has an unchanged neurologic exam, he voids, and he ambulates. The patient will receive another dose of TXA. The patient will be out of bed as tolerated with no brace. The patient is anticipated to discharge to home today and be seen in the office in approximately 2 weeks.    Colette Davies, MD Orthopedic Surgeon

## 2023-11-16 ENCOUNTER — Encounter (HOSPITAL_COMMUNITY): Payer: Self-pay | Admitting: Orthopedic Surgery

## 2023-11-18 ENCOUNTER — Telehealth: Payer: Self-pay | Admitting: Orthopedic Surgery

## 2023-11-18 ENCOUNTER — Other Ambulatory Visit (HOSPITAL_COMMUNITY): Payer: Self-pay

## 2023-11-18 MED ORDER — HYDROMORPHONE HCL 2 MG PO TABS: 1.0000 mg | ORAL_TABLET | ORAL | 0 refills | Status: AC | PRN

## 2023-11-18 NOTE — Telephone Encounter (Signed)
 Patient called and ask for an prescription for hydromorphone  for pain. [id number]#[phone]

## 2023-11-18 NOTE — Addendum Note (Signed)
 Addended by: Colette Davies on: 11/18/2023 05:05 PM   Modules accepted: Orders

## 2023-11-20 ENCOUNTER — Other Ambulatory Visit: Payer: Self-pay | Admitting: Family Medicine

## 2023-11-21 ENCOUNTER — Other Ambulatory Visit (HOSPITAL_COMMUNITY): Payer: Self-pay

## 2023-11-28 ENCOUNTER — Ambulatory Visit (INDEPENDENT_AMBULATORY_CARE_PROVIDER_SITE_OTHER): Admitting: Orthopedic Surgery

## 2023-11-28 DIAGNOSIS — Z9889 Other specified postprocedural states: Secondary | ICD-10-CM

## 2023-11-28 NOTE — Progress Notes (Addendum)
 Orthopedic Surgery Post-operative Office Visit  Procedure: L2/3 laminectomy Date of Surgery: 11/15/2023 (~2 weeks post-op)  Assessment: Patient is a 68 y.o. who is doing well after surgery with resolution of his radiating leg pain   Plan: -Operative plans complete -Out of bed as tolerated, no brace -No bending/lifting/twisting greater than 10 pounds -Okay to let soap/water  run over the incision but do not submerge -Pain management: methocarbamol , tylenol  -Return to office in 4 weeks, lumbar x-rays needed at next visit: none  ___________________________________________________________________________   Subjective: Patient has been doing well since surgery.  His radiating leg pain has resolved.  He still notices some decreased sensation over the lateral aspect of his right thigh.  He states that this decreased sensation is intermittent.  He does not notice it when he is moving but notices at night when he is laying down.  His incisional back pain has gotten better with time.  He still notices it when he sitting against a hard chair but otherwise is having his chronic level of back pain.  He is only taking methocarbamol  for pain. Has been able to do more with time. Has gotten up to walking 1 mile per day since surgery.  He has not noticed any redness or drainage from the incision.  Objective:  General: no acute distress, appropriate affect Neurologic: alert, answering questions appropriately, following commands Respiratory: unlabored breathing on room air Skin: incision is well-approximated with no erythema, induration, active/expressible drainage   MSK (spine):  -Strength exam      Left  Right  EHL    5/5  5/5 TA    5/5  5/5 GSC    5/5  5/5 Knee extension  5/5  5/5 Hip flexion   5/5  5/5  -Sensory exam    Sensation intact to light touch in L2-S1 nerve distributions of bilateral lower extremities  Imaging: None obtained at today's visit   Patient name: Joseph [name] Patient MRN: [id number] Date of visit: 11/28/23

## 2023-12-03 ENCOUNTER — Other Ambulatory Visit: Payer: Self-pay | Admitting: Family Medicine

## 2023-12-03 DIAGNOSIS — I10 Essential (primary) hypertension: Secondary | ICD-10-CM

## 2023-12-28 ENCOUNTER — Other Ambulatory Visit: Payer: Self-pay | Admitting: Family Medicine

## 2024-01-09 ENCOUNTER — Ambulatory Visit (INDEPENDENT_AMBULATORY_CARE_PROVIDER_SITE_OTHER): Admitting: Orthopedic Surgery

## 2024-01-09 DIAGNOSIS — Z9889 Other specified postprocedural states: Secondary | ICD-10-CM

## 2024-01-09 NOTE — Progress Notes (Signed)
 Orthopedic Surgery Post-operative Office Visit   Procedure: L2/3 laminectomy Date of Surgery: 11/15/2023 (~6 weeks post-op)   Assessment: Patient is a 68 y.o. who has had resolution of his radiating leg pain. Having some back pain     Plan: -No spine specific precautions -Okay to submerge wound at this point -Referred to PT  -Pain management: tylenol  as needed -Return to office in 6 weeks, x-rays needed at next visit: AP/lateral/flex/ex lumbar   ___________________________________________________________________________     Subjective: Patient has had resolution of his radiating leg pain since surgery.  He has noticed back pain.  He feels that as he has been getting more active, his back pain has been getting better.  He said he is been inactive and not doing much for the last 8 months after recovering from his shoulder surgery and now has back surgery.  He has not noticed any redness or drainage around his incision.  He is taking Tylenol  for pain relief.   Objective:   General: no acute distress, appropriate affect Neurologic: alert, answering questions appropriately, following commands Respiratory: unlabored breathing on room air Skin: incision is well healed with no erythema, induration, active/expressible drainage   MSK (spine):   -Strength exam                                                   Left                  Right   EHL                              5/5                  5/5 TA                                 5/5                  5/5 GSC                             5/5                  5/5 Knee extension            5/5                  5/5 Hip flexion                    5/5                  5/5   -Sensory exam                           Sensation intact to light touch in L2-S1 nerve distributions of bilateral lower extremities   Imaging: None obtained at today's visit     Patient name: Joseph [name] Patient MRN: [id number] Date of visit: 01/09/24

## 2024-01-13 ENCOUNTER — Ambulatory Visit: Admitting: Orthopedic Surgery

## 2024-01-13 ENCOUNTER — Other Ambulatory Visit (INDEPENDENT_AMBULATORY_CARE_PROVIDER_SITE_OTHER): Payer: Self-pay

## 2024-01-13 ENCOUNTER — Encounter: Payer: Self-pay | Admitting: Orthopedic Surgery

## 2024-01-13 DIAGNOSIS — Z96611 Presence of right artificial shoulder joint: Secondary | ICD-10-CM | POA: Diagnosis not present

## 2024-01-13 NOTE — Progress Notes (Signed)
 Post-Op Visit Note   Patient: Joseph [name]           Date of Birth: Aug 22, 1955           MRN: [id number] Visit Date: 01/13/2024 PCP: Frann Mabel Mt, DO   Assessment & Plan:  Chief Complaint:  Chief Complaint  Patient presents with   Right Shoulder - Follow-up   Visit Diagnoses:  1. History of arthroplasty of right shoulder     Plan: Patient is now about 6 months out right shoulder reverse shoulder replacement.  Overall he is doing well.  Not really having significant shoulder pain.  He has been doing little projects in the garage.  Cutting the grass and using the weedeater.  He has been doing strap exercises along with weights.  He is able to lay on that right shoulder some.  He describes having an occasional small pain.  On exam range of motion is 30/80/100.  Excellent rotator cuff strength including subscap strength.  Radiographs look good.  Plan at this time is to continue current activity level.  Would not lift more than 15 pounds with this shoulder which is a revision shoulder.  42-month return for final check and repeat radiographs.  Follow-Up Instructions: No follow-ups on file.   Orders:  Orders Placed This Encounter  Procedures   XR Shoulder Right   No orders of the defined types were placed in this encounter.   Imaging: XR Shoulder Right Result Date: 01/13/2024 AP axillary outlet radiograph right shoulder reviewed.  Vault reconstruction system prosthesis in good position alignment.  No lucency around the baseplate or screws.  Humeral stem in good position.  No interval change compared to radiographs from 3 months ago.   PMFS History: Patient Active Problem List   Diagnosis Date Noted   Loosening of total shoulder replacement (HCC) 07/31/2023   S/P reverse total shoulder arthroplasty, right 07/26/2023   Medication management 05/16/2023   PICC (peripherally inserted central catheter) in place 05/16/2023   Prosthetic joint infection (HCC) 04/29/2023    OA (osteoarthritis) of shoulder 04/28/2023   S/P hardware removal 04/28/2023   Umbilical hernia without obstruction and without gangrene 07/05/2022   Insulin resistance 09/03/2019   Cubital tunnel syndrome 07/16/2019   Localized, primary osteoarthritis of shoulder region 07/16/2019   Shoulder pain 07/16/2019   Low testosterone  04/13/2018   Mixed hyperlipidemia    Bilateral sensorineural hearing loss 11/01/2017   Subjective tinnitus of right ear 11/01/2017   Hyperlipidemia 09/14/2017   History of tobacco abuse 09/14/2017   Overweight (BMI 25.0-29.9) 09/14/2017   Vertigo 07/14/2016   Arthritis of hand 02/09/2016   Right hand pain 02/09/2016   Cervical spondylosis with myelopathy 01/15/2016   Lumbar facet arthropathy 12/12/2015   Lumbar stenosis with neurogenic claudication 12/12/2015   Meralgia paresthetica 12/12/2015   Neurofibroma 12/12/2015   Other intervertebral disc degeneration, lumbar region 12/12/2015   Dysphagia 10/31/2015   GERD (gastroesophageal reflux disease) 10/31/2015   Neuropathy 10/21/2015   Essential hypertension 02/11/2015   Shingles 07/10/2014   Low back pain 05/01/2014   Kidney stone 05/01/2014   Past Medical History:  Diagnosis Date   Arthritis    Complication of anesthesia    GERD (gastroesophageal reflux disease)    Heart murmur    as a child, pt states he had an ECHO about 30 years ago (1995) and it was normal   Hypertension    Mixed hyperlipidemia    Osteoarthritis    Pneumonia    PONV (postoperative nausea  and vomiting)    Prediabetes    Rheumatic fever    Vertigo     Family History  Problem Relation Age of Onset   Arthritis Mother        Rheumatoid   Diabetes Mother    Arthritis Father    Hypertension Father    Diabetes Brother    Lupus Brother     Past Surgical History:  Procedure Laterality Date   BACK SURGERY  2012   DECOMPRESSIVE LUMBAR LAMINECTOMY LEVEL 1 N/A 11/15/2023   Procedure: DECOMPRESSIVE LUMBAR LAMINECTOMY LEVEL 1;   Surgeon: Georgina Ozell LABOR, MD;  Location: [facility] OR;  Service: Orthopedics;  Laterality: N/A;  L2-3 LAMINECTOMY   JOINT REPLACEMENT Right 2013   right shoulder   SPINE SURGERY  1995   C4-5, C5-C6 (cervical fusion)   SYNOVECTOMY WITH REMOVAL OF TOTAL SHOULDER Right 04/28/2023   Procedure: REMOVAL RIGHT SHOULDER IMPLANTS AND PLACEMENT OF ANTIBIOTIC SPACER;  Surgeon: Addie Cordella Hamilton, MD;  Location: [facility] OR;  Service: Orthopedics;  Laterality: Right;   TONSILLECTOMY  1963   TOTAL SHOULDER REVISION Right 07/26/2023   Procedure: RIGHT REVERSE SHOULDER ARTHROPLASTY, REMOVAL ANTIBIOTIC SPACER;  Surgeon: Addie Cordella Hamilton, MD;  Location: [facility] OR;  Service: Orthopedics;  Laterality: Right;   Social History   Occupational History   Occupation: Disability    Comment: Shoulder / Back  Tobacco Use   Smoking status: Former    Current packs/day: 0.00    Average packs/day: 1.5 packs/day for 20.0 years (30.0 ttl pk-yrs)    Types: Cigarettes    Start date: 05/01/1970    Quit date: 05/01/1990    Years since quitting: 33.7   Smokeless tobacco: Never  Vaping Use   Vaping status: Never Used  Substance and Sexual Activity   Alcohol use: No    Alcohol/week: 0.0 standard drinks of alcohol   Drug use: No   Sexual activity: Yes    Partners: Female

## 2024-01-19 ENCOUNTER — Encounter: Payer: Self-pay | Admitting: Orthopedic Surgery

## 2024-01-23 ENCOUNTER — Telehealth: Payer: Self-pay

## 2024-01-23 NOTE — Telephone Encounter (Signed)
 Received fax back from [facility] in [place] - they do not accept [employer] (the patient's secondary, medicare supplement).  I called [facility] in [place] and spoke with Joy - they don't take [facility], but being that this is a supplement, it should be ok. She said to go ahead and fax the order and she will run the insurance to see (which I have done). I called the patient and explained all this to him, and he was ok with change of location.

## 2024-01-25 DIAGNOSIS — M545 Low back pain, unspecified: Secondary | ICD-10-CM | POA: Diagnosis not present

## 2024-01-26 ENCOUNTER — Ambulatory Visit: Admitting: Orthopedic Surgery

## 2024-01-27 DIAGNOSIS — M545 Low back pain, unspecified: Secondary | ICD-10-CM | POA: Diagnosis not present

## 2024-02-07 DIAGNOSIS — M545 Low back pain, unspecified: Secondary | ICD-10-CM | POA: Diagnosis not present

## 2024-02-09 DIAGNOSIS — M545 Low back pain, unspecified: Secondary | ICD-10-CM | POA: Diagnosis not present

## 2024-02-15 DIAGNOSIS — M545 Low back pain, unspecified: Secondary | ICD-10-CM | POA: Diagnosis not present

## 2024-02-17 DIAGNOSIS — M545 Low back pain, unspecified: Secondary | ICD-10-CM | POA: Diagnosis not present

## 2024-02-19 ENCOUNTER — Other Ambulatory Visit: Payer: Self-pay | Admitting: Family Medicine

## 2024-02-20 ENCOUNTER — Other Ambulatory Visit (INDEPENDENT_AMBULATORY_CARE_PROVIDER_SITE_OTHER): Payer: Self-pay

## 2024-02-20 ENCOUNTER — Ambulatory Visit (INDEPENDENT_AMBULATORY_CARE_PROVIDER_SITE_OTHER): Admitting: Orthopedic Surgery

## 2024-02-20 DIAGNOSIS — M545 Low back pain, unspecified: Secondary | ICD-10-CM

## 2024-02-20 DIAGNOSIS — Z9889 Other specified postprocedural states: Secondary | ICD-10-CM | POA: Diagnosis not present

## 2024-02-20 MED ORDER — METHOCARBAMOL 500 MG PO TABS: 500.0000 mg | ORAL_TABLET | Freq: Three times a day (TID) | ORAL | 1 refills | Status: DC | PRN

## 2024-02-20 NOTE — Progress Notes (Signed)
 Orthopedic Surgery Post-operative Office Visit   Procedure: L2/3 laminectomy Date of Surgery: 11/15/2023 (~3 months post-op)   Assessment: Patient is a 68 y.o. who has had resolution of his radiating leg pain. Still having back pain     Plan: -No spine specific precautions -Continue with physical therapy.  Encouraged him to use heat and start his stretching program daily at home.  He should work on core strengthening as well and get into a routine where he is doing that regularly at home -Pain management: tylenol , lidocaine  patches, robaxin  -Return to office in 3 months, x-rays needed at next visit: AP/lateral/flex/ex lumbar   ___________________________________________________________________________     Subjective: Patient is not having any radiating leg pain at this time.  He is however still having back pain.  He feels the back pain is worst in the morning or if he bends over to pick something up.  He has done now 3 weeks of PT.  He has been working on stretching with them.  They are starting core strengthening now.  He has not noticed any improvement in the 3 weeks that he has been working with PT.  He is using Tylenol  for pain control.     Objective:   General: no acute distress, appropriate affect Neurologic: alert, answering questions appropriately, following commands Respiratory: unlabored breathing on room air Skin: incision is well healed   MSK (spine):   -Strength exam                                                   Left                  Right   EHL                              5/5                  5/5 TA                                 5/5                  5/5 GSC                             5/5                  5/5 Knee extension            5/5                  5/5 Hip flexion                    5/5                  5/5   -Sensory exam                           Sensation intact to light touch in L2-S1 nerve distributions of bilateral lower extremities    Imaging: XRs of the lumbar spine from 02/20/2024 were independently reviewed and interpreted, showing a laminectomy defect at L2/3.  No evidence of instability on flexion/extension views.  No fracture or dislocation seen.  Disc height loss with anterior osteophyte formation at L1/2, L2/3, L4/5.  Most significant degenerative changes seen at L4/5.     Patient name: Joseph [name] Patient MRN: [id number] Date of visit: 02/20/24

## 2024-02-21 DIAGNOSIS — M545 Low back pain, unspecified: Secondary | ICD-10-CM | POA: Diagnosis not present

## 2024-02-25 ENCOUNTER — Other Ambulatory Visit: Payer: Self-pay | Admitting: Family Medicine

## 2024-02-29 ENCOUNTER — Encounter: Payer: Self-pay | Admitting: Family Medicine

## 2024-03-02 ENCOUNTER — Other Ambulatory Visit: Payer: Self-pay | Admitting: Family Medicine

## 2024-03-02 DIAGNOSIS — I10 Essential (primary) hypertension: Secondary | ICD-10-CM

## 2024-03-07 ENCOUNTER — Other Ambulatory Visit (HOSPITAL_COMMUNITY): Payer: Self-pay

## 2024-03-12 ENCOUNTER — Other Ambulatory Visit: Payer: Self-pay

## 2024-03-12 MED ORDER — DICLOFENAC SODIUM 1.5 % EX SOLN: CUTANEOUS | 0 refills | Status: AC

## 2024-03-13 ENCOUNTER — Other Ambulatory Visit: Payer: Self-pay | Admitting: Family Medicine

## 2024-03-27 ENCOUNTER — Encounter: Payer: Self-pay | Admitting: Family Medicine

## 2024-03-27 ENCOUNTER — Other Ambulatory Visit: Payer: Self-pay

## 2024-03-27 MED ORDER — MELOXICAM 7.5 MG PO TABS: 7.5000 mg | ORAL_TABLET | Freq: Every day | ORAL | 2 refills | Status: AC | PRN

## 2024-04-09 ENCOUNTER — Other Ambulatory Visit: Payer: Self-pay

## 2024-04-09 ENCOUNTER — Ambulatory Visit: Admitting: Surgical

## 2024-04-09 ENCOUNTER — Encounter: Payer: Self-pay | Admitting: Surgical

## 2024-04-09 DIAGNOSIS — M25511 Pain in right shoulder: Secondary | ICD-10-CM

## 2024-04-09 DIAGNOSIS — Z96611 Presence of right artificial shoulder joint: Secondary | ICD-10-CM

## 2024-04-09 DIAGNOSIS — G8929 Other chronic pain: Secondary | ICD-10-CM

## 2024-04-09 NOTE — Progress Notes (Signed)
 Office Visit Note   Patient: Joseph [name]           Date of Birth: September 27, 1955           MRN: [id number] Visit Date: 04/09/2024 Requested by: Frann Mabel Mt, DO [address] [place],  [place] [address] PCP: Frann Mabel Mt, DO  Subjective: Chief Complaint  Patient presents with   Right Shoulder - Pain    HPI: Joseph [name] is a 68 y.o. male who presents to the office reporting right shoulder discomfort.  Patient has history of 2 stage reverse shoulder arthroplasty that was performed for particle disease from prior total shoulder arthroplasty that was performed in 2013.  He had final stage of right reverse shoulder arthroplasty on 07/26/2023 and was doing very well with really no discomfort or pain until 3 weeks ago.  He was walking his dog who walks off leash and was walking around when he lifted his arm and felt like the shoulder wanted to come loose.  He feels like the shoulder fell out and down.  He feels like it is moving and he has new crepitus and mechanical symptoms primarily with external rotation and forward elevation.  He now has aching symptoms and apprehension with mobility of the shoulder.  He has not been doing any heavy lifting and has not really lifted more than 10 pounds at any point since his reverse shoulder replacement.  He has resorted to wearing the sling in order to help with pain and apprehension.  Taking meloxicam  and muscle relaxer.              ROS: All systems reviewed are negative as they relate to the chief complaint within the history of present illness.  Patient denies fevers or chills.  Assessment & Plan: Visit Diagnoses:  1. Chronic right shoulder pain   2. Presence of right artificial shoulder joint     Plan: Patient is a 68 year old male who presents for evaluation of acute right shoulder pain that he noticed as of 3 weeks ago.  He has history of reverse shoulder arthroplasty performed in 2 staged procedure with  removal of implants on 04/28/2023 and reverse shoulder arthroplasty with vault reconstruction system on 07/26/2023.  Op note from 11/14 details subscap that was reattached to the humerus but op note from 2/11 describes no real anterior soft tissue to reattach at that time.  At his last appointment with Dr. Addie on 01/13/2024, he did have excellent strength of subscap but today his strength is 3/5 of the right shoulder relative to 5/5 in the left shoulder.  Testing the strength with does reproduce his pain.  He has new crepitus and apprehension which may be subluxation events of the RSA or could be new subscap rupture.  With the distinct change in his right shoulder symptoms, needs CT scan of the right shoulder for further evaluation of loosening versus instability versus occult fracture and follow-up after CT scan to review results with Dr. Addie.  If CT scan is negative, we could evaluate anterior aspect of the right shoulder with ultrasound to further evaluate status of the subscapularis.  Follow-Up Instructions: Return in about 3 weeks (around 04/30/2024).   Orders:  Orders Placed This Encounter  Procedures   XR Shoulder Right   CT SHOULDER RIGHT WO CONTRAST   No orders of the defined types were placed in this encounter.     Procedures: No procedures performed   Clinical Data: No additional findings.  Objective: Vital Signs: There were no vitals taken for this visit.  Physical Exam:  Constitutional: Patient appears well-developed HEENT:  Head: Normocephalic Eyes:EOM are normal Neck: Normal range of motion Cardiovascular: Normal rate Pulmonary/chest: Effort normal Neurologic: Patient is alert Skin: Skin is warm Psychiatric: Patient has normal mood and affect  Ortho Exam: Ortho exam demonstrates right shoulder with 20 degrees external rotation passively with likely more but patient is resisting due to pain and apprehension.  He has about 90 degrees of abduction passively and 110  degrees of forward elevation passively.  There is a slight amount of crepitus noted anteriorly and around the posterior acromion with forward elevation in particular.  He does have some mild tenderness over the midportion of the acromion.  No tenderness over the distal tip of the acromion or the AC joint.  Does have some moderate tenderness over the lesser tuberosity/anterior aspect of the humeral tray region.  Intact EPL, FPL, finger abduction, grip strength testing, pronation/supination, bicep, tricep, deltoid.  Axillary nerve intact with deltoid firing.  3/5 subscap strength/internal rotation strength relative to 5/5 in the contralateral shoulder.  Specialty Comments:  No specialty comments available.  Imaging: No results found.   PMFS History: Patient Active Problem List   Diagnosis Date Noted   Loosening of total shoulder replacement 07/31/2023   S/P reverse total shoulder arthroplasty, right 07/26/2023   Medication management 05/16/2023   PICC (peripherally inserted central catheter) in place 05/16/2023   Prosthetic joint infection 04/29/2023   OA (osteoarthritis) of shoulder 04/28/2023   S/P hardware removal 04/28/2023   Umbilical hernia without obstruction and without gangrene 07/05/2022   Insulin resistance 09/03/2019   Cubital tunnel syndrome 07/16/2019   Localized, primary osteoarthritis of shoulder region 07/16/2019   Shoulder pain 07/16/2019   Low testosterone  04/13/2018   Mixed hyperlipidemia    Bilateral sensorineural hearing loss 11/01/2017   Subjective tinnitus of right ear 11/01/2017   Hyperlipidemia 09/14/2017   History of tobacco abuse 09/14/2017   Overweight (BMI 25.0-29.9) 09/14/2017   Vertigo 07/14/2016   Arthritis of hand 02/09/2016   Right hand pain 02/09/2016   Cervical spondylosis with myelopathy 01/15/2016   Lumbar facet arthropathy 12/12/2015   Lumbar stenosis with neurogenic claudication 12/12/2015   Meralgia paresthetica 12/12/2015   Neurofibroma  12/12/2015   Other intervertebral disc degeneration, lumbar region 12/12/2015   Dysphagia 10/31/2015   GERD (gastroesophageal reflux disease) 10/31/2015   Neuropathy 10/21/2015   Essential hypertension 02/11/2015   Shingles 07/10/2014   Low back pain 05/01/2014   Kidney stone 05/01/2014   Past Medical History:  Diagnosis Date   Arthritis    Complication of anesthesia    GERD (gastroesophageal reflux disease)    Heart murmur    as a child, pt states he had an ECHO about 30 years ago (1995) and it was normal   Hypertension    Mixed hyperlipidemia    Osteoarthritis    Pneumonia    PONV (postoperative nausea and vomiting)    Prediabetes    Rheumatic fever    Vertigo     Family History  Problem Relation Age of Onset   Arthritis Mother        Rheumatoid   Diabetes Mother    Arthritis Father    Hypertension Father    Diabetes Brother    Lupus Brother     Past Surgical History:  Procedure Laterality Date   BACK SURGERY  2012   DECOMPRESSIVE LUMBAR LAMINECTOMY LEVEL 1 N/A 11/15/2023   Procedure:  DECOMPRESSIVE LUMBAR LAMINECTOMY LEVEL 1;  Surgeon: Georgina Ozell LABOR, MD;  Location: [facility] OR;  Service: Orthopedics;  Laterality: N/A;  L2-3 LAMINECTOMY   JOINT REPLACEMENT Right 2013   right shoulder   SPINE SURGERY  1995   C4-5, C5-C6 (cervical fusion)   SYNOVECTOMY WITH REMOVAL OF TOTAL SHOULDER Right 04/28/2023   Procedure: REMOVAL RIGHT SHOULDER IMPLANTS AND PLACEMENT OF ANTIBIOTIC SPACER;  Surgeon: Addie Cordella Hamilton, MD;  Location: [facility] OR;  Service: Orthopedics;  Laterality: Right;   TONSILLECTOMY  1963   TOTAL SHOULDER REVISION Right 07/26/2023   Procedure: RIGHT REVERSE SHOULDER ARTHROPLASTY, REMOVAL ANTIBIOTIC SPACER;  Surgeon: Addie Cordella Hamilton, MD;  Location: [facility] OR;  Service: Orthopedics;  Laterality: Right;   Social History   Occupational History   Occupation: Disability    Comment: Shoulder / Back  Tobacco Use   Smoking status: Former    Current packs/day: 0.00     Average packs/day: 1.5 packs/day for 20.0 years (30.0 ttl pk-yrs)    Types: Cigarettes    Start date: 05/01/1970    Quit date: 05/01/1990    Years since quitting: 33.9   Smokeless tobacco: Never  Vaping Use   Vaping status: Never Used  Substance and Sexual Activity   Alcohol use: No    Alcohol/week: 0.0 standard drinks of alcohol   Drug use: No   Sexual activity: Yes    Partners: Female

## 2024-04-16 ENCOUNTER — Encounter: Payer: Self-pay | Admitting: Radiology

## 2024-04-17 ENCOUNTER — Ambulatory Visit
Admission: RE | Admit: 2024-04-17 | Discharge: 2024-04-17 | Disposition: A | Discharge status: Home / Self Care | Source: Ambulatory Visit | Attending: Surgical | Admitting: Surgical

## 2024-04-17 DIAGNOSIS — Z96611 Presence of right artificial shoulder joint: Secondary | ICD-10-CM

## 2024-04-17 DIAGNOSIS — M19011 Primary osteoarthritis, right shoulder: Secondary | ICD-10-CM | POA: Diagnosis not present

## 2024-04-17 DIAGNOSIS — G8929 Other chronic pain: Secondary | ICD-10-CM

## 2024-04-30 ENCOUNTER — Ambulatory Visit: Admitting: Surgical

## 2024-04-30 ENCOUNTER — Other Ambulatory Visit

## 2024-04-30 DIAGNOSIS — G8929 Other chronic pain: Secondary | ICD-10-CM

## 2024-04-30 DIAGNOSIS — Z96611 Presence of right artificial shoulder joint: Secondary | ICD-10-CM

## 2024-04-30 DIAGNOSIS — M25511 Pain in right shoulder: Secondary | ICD-10-CM

## 2024-05-06 ENCOUNTER — Encounter: Payer: Self-pay | Admitting: Surgical

## 2024-05-06 NOTE — Progress Notes (Signed)
 Follow-up Office Visit Note   Patient: Joseph [name]           Date of Birth: 11-21-1955           MRN: [id number] Visit Date: 04/30/2024 Requested by: Frann Mabel Mt, DO [address] [place],  [place] [address] PCP: Frann Mabel Mt, DO  Subjective: Chief Complaint  Patient presents with   Right Shoulder - Pain, Follow-up    HPI: Joseph [name] is a 68 y.o. male who returns to the office for follow-up visit.    Plan at last visit was: Patient is a 68 year old male who presents for evaluation of acute right shoulder pain that he noticed as of 3 weeks ago.  He has history of reverse shoulder arthroplasty performed in 2 staged procedure with removal of implants on 04/28/2023 and reverse shoulder arthroplasty with vault reconstruction system on 07/26/2023.  Op note from 11/14 details subscap that was reattached to the humerus but op note from 2/11 describes no real anterior soft tissue to reattach at that time.  At his last appointment with Dr. Addie on 01/13/2024, he did have excellent strength of subscap but today his strength is 3/5 of the right shoulder relative to 5/5 in the left shoulder.  Testing the strength with does reproduce his pain.  He has new crepitus and apprehension which may be subluxation events of the RSA or could be new subscap rupture.  With the distinct change in his right shoulder symptoms, needs CT scan of the right shoulder for further evaluation of loosening versus instability versus occult fracture and follow-up after CT scan to review results with Dr. Addie.  If CT scan is negative, we could evaluate anterior aspect of the right shoulder with ultrasound to further evaluate status of the subscapularis.   Since then, patient notes he continues to report right shoulder discomfort and subluxation episodes where he feels like his shoulder will drop down.  He has continued mechanical symptoms in the shoulder.  Also has noticed since this  difficulty with the right shoulder has begun, he has had intermittent tingling sensation in the left trapezius muscle belly with no radicular pain down the left arm and no tingling/numbness/radicular pain in the right arm.  He does have history of C3-4 and C4-5 fusion in the mid 90s.  Uses [employer]  on his neck for any chronic neck pain.  Has not had any recent ESI's.  Denies any weakness of the left arm or any gait changes.              ROS: All systems reviewed are negative as they relate to the chief complaint within the history of present illness.  Patient denies fevers or chills.  Assessment & Plan: Visit Diagnoses:  1. Presence of right artificial shoulder joint   2. Chronic right shoulder pain     Plan: Joseph [name] is a 68 y.o. male who returns to the office for follow-up visit.  Plan from last visit was noted above in HPI.  They now return with no change since prior exam.  CT scan of the right shoulder demonstrates hardware that is intact with some bone loss that seems more consistent with his bone loss from prior surgery rather than related to true scapular notching.  Atrophy noted of the subscap muscle.  Ultrasound examination in the clinic today demonstrates no discernible subscapularis tendon still intact under dynamic exam with rotation of the shoulder.  Impression is that patient is having some instability  episodes in the form of low-grade subluxation that is likely related to decreased ability from the failure of his subscapularis tendon.  This is expected given the numerous procedures through deltopectoral approach that he has had.  Options available to patient would be modify his activity level to avoid positions that cause his shoulder to feel painful or unstable versus revision surgery likely in the form of polyethylene swap to a retentive poly.  Patient will try activity modification and follow-up in 3 months for clinical recheck.  Follow-Up Instructions: No follow-ups on file.    Orders:  Orders Placed This Encounter  Procedures   US  Guided Needle Placement - No Linked Charges   No orders of the defined types were placed in this encounter.     Procedures: No procedures performed   Clinical Data: No additional findings.  Objective: Vital Signs: There were no vitals taken for this visit.  Physical Exam:  Constitutional: Patient appears well-developed HEENT:  Head: Normocephalic Eyes:EOM are normal Neck: Normal range of motion Cardiovascular: Normal rate Pulmonary/chest: Effort normal Neurologic: Patient is alert Skin: Skin is warm Psychiatric: Patient has normal mood and affect  Ortho Exam: Ortho exam demonstrates right shoulder with no demonstrable instability on exam with anterior or posterior directed force.  No sulcus sign compared with baseline of shoulder appearance consistent with his history of reverse shoulder arthroplasty.  Incision is well-healed.  No sinus tract noted.  Intact EPL, FPL, finger abduction, pronation/supination, bicep, tricep, deltoid.  Axillary nerve intact with deltoid firing.  He does have subscap weakness rated 4 -/5 of the right shoulder relative to 5/5 strength on the left shoulder.  This is a change compared with the previous exam from several months ago where he had 5/5 strength of the subscap and the right shoulder.  No AC joint tenderness.  Minimal tenderness throughout the acromion.  Negative Lhermitte sign and negative Spurling sign without reproduction of radicular pain.  He does have decreased active and passive motion of the cervical spine consistent with his history of C-spine fusion.  Specialty Comments:  No specialty comments available.  Imaging: No results found.   PMFS History: Patient Active Problem List   Diagnosis Date Noted   Loosening of total shoulder replacement 07/31/2023   S/P reverse total shoulder arthroplasty, right 07/26/2023   Medication management 05/16/2023   PICC (peripherally  inserted central catheter) in place 05/16/2023   Prosthetic joint infection 04/29/2023   OA (osteoarthritis) of shoulder 04/28/2023   S/P hardware removal 04/28/2023   Umbilical hernia without obstruction and without gangrene 07/05/2022   Insulin resistance 09/03/2019   Cubital tunnel syndrome 07/16/2019   Localized, primary osteoarthritis of shoulder region 07/16/2019   Shoulder pain 07/16/2019   Low testosterone  04/13/2018   Mixed hyperlipidemia    Bilateral sensorineural hearing loss 11/01/2017   Subjective tinnitus of right ear 11/01/2017   Hyperlipidemia 09/14/2017   History of tobacco abuse 09/14/2017   Overweight (BMI 25.0-29.9) 09/14/2017   Vertigo 07/14/2016   Arthritis of hand 02/09/2016   Right hand pain 02/09/2016   Cervical spondylosis with myelopathy 01/15/2016   Lumbar facet arthropathy 12/12/2015   Lumbar stenosis with neurogenic claudication 12/12/2015   Meralgia paresthetica 12/12/2015   Neurofibroma 12/12/2015   Other intervertebral disc degeneration, lumbar region 12/12/2015   Dysphagia 10/31/2015   GERD (gastroesophageal reflux disease) 10/31/2015   Neuropathy 10/21/2015   Essential hypertension 02/11/2015   Shingles 07/10/2014   Low back pain 05/01/2014   Kidney stone 05/01/2014   Past Medical  History:  Diagnosis Date   Arthritis    Complication of anesthesia    GERD (gastroesophageal reflux disease)    Heart murmur    as a child, pt states he had an ECHO about 30 years ago (1995) and it was normal   Hypertension    Mixed hyperlipidemia    Osteoarthritis    Pneumonia    PONV (postoperative nausea and vomiting)    Prediabetes    Rheumatic fever    Vertigo     Family History  Problem Relation Age of Onset   Arthritis Mother        Rheumatoid   Diabetes Mother    Arthritis Father    Hypertension Father    Diabetes Brother    Lupus Brother     Past Surgical History:  Procedure Laterality Date   BACK SURGERY  2012   DECOMPRESSIVE LUMBAR  LAMINECTOMY LEVEL 1 N/A 11/15/2023   Procedure: DECOMPRESSIVE LUMBAR LAMINECTOMY LEVEL 1;  Surgeon: Georgina Ozell LABOR, MD;  Location: [facility] OR;  Service: Orthopedics;  Laterality: N/A;  L2-3 LAMINECTOMY   JOINT REPLACEMENT Right 2013   right shoulder   SPINE SURGERY  1995   C4-5, C5-C6 (cervical fusion)   SYNOVECTOMY WITH REMOVAL OF TOTAL SHOULDER Right 04/28/2023   Procedure: REMOVAL RIGHT SHOULDER IMPLANTS AND PLACEMENT OF ANTIBIOTIC SPACER;  Surgeon: Addie Cordella Hamilton, MD;  Location: [facility] OR;  Service: Orthopedics;  Laterality: Right;   TONSILLECTOMY  1963   TOTAL SHOULDER REVISION Right 07/26/2023   Procedure: RIGHT REVERSE SHOULDER ARTHROPLASTY, REMOVAL ANTIBIOTIC SPACER;  Surgeon: Addie Cordella Hamilton, MD;  Location: [facility] OR;  Service: Orthopedics;  Laterality: Right;   Social History   Occupational History   Occupation: Disability    Comment: Shoulder / Back  Tobacco Use   Smoking status: Former    Current packs/day: 0.00    Average packs/day: 1.5 packs/day for 20.0 years (30.0 ttl pk-yrs)    Types: Cigarettes    Start date: 05/01/1970    Quit date: 05/01/1990    Years since quitting: 34.0   Smokeless tobacco: Never  Vaping Use   Vaping status: Never Used  Substance and Sexual Activity   Alcohol use: No    Alcohol/week: 0.0 standard drinks of alcohol   Drug use: No   Sexual activity: Yes    Partners: Female

## 2024-05-15 ENCOUNTER — Other Ambulatory Visit: Payer: Self-pay | Admitting: Family Medicine

## 2024-05-22 ENCOUNTER — Ambulatory Visit (HOSPITAL_BASED_OUTPATIENT_CLINIC_OR_DEPARTMENT_OTHER)
Admission: RE | Admit: 2024-05-22 | Discharge: 2024-05-22 | Disposition: A | Source: Ambulatory Visit | Attending: Family Medicine

## 2024-05-22 ENCOUNTER — Ambulatory Visit: Admitting: Family Medicine

## 2024-05-22 ENCOUNTER — Encounter: Payer: Self-pay | Admitting: Family Medicine

## 2024-05-22 VITALS — BP 126/78 | HR 88 | Resp 16 | Ht 74.0 in | Wt 219.2 lb

## 2024-05-22 DIAGNOSIS — M542 Cervicalgia: Secondary | ICD-10-CM | POA: Diagnosis not present

## 2024-05-22 DIAGNOSIS — R29898 Other symptoms and signs involving the musculoskeletal system: Secondary | ICD-10-CM | POA: Diagnosis not present

## 2024-05-22 NOTE — Patient Instructions (Signed)
 Heat (pad or rice pillow in microwave) over affected area, 10-15 minutes twice daily.   Ice/cold pack over area for 10-15 min twice daily.  OK to take Tylenol  1000 mg (2 extra strength tabs) or 975 mg (3 regular strength tabs) every 6 hours as needed.  We will be in touch with your X-ray results. If unremarkable, we will proceed with an MRI.   Let us  know if you need anything.  EXERCISES RANGE OF MOTION (ROM) AND STRETCHING EXERCISES  These exercises may help you when beginning to rehabilitate your issue. In order to successfully resolve your symptoms, you must improve your posture. These exercises are designed to help reduce the forward-head and rounded-shoulder posture which contributes to this condition. Your symptoms may resolve with or without further involvement from your physician, physical therapist or athletic trainer. While completing these exercises, remember:  Restoring tissue flexibility helps normal motion to return to the joints. This allows healthier, less painful movement and activity. An effective stretch should be held for at least 20 seconds, although you may need to begin with shorter hold times for comfort. A stretch should never be painful. You should only feel a gentle lengthening or release in the stretched tissue. Do not do any stretch or exercise that you cannot tolerate.  STRETCH- Axial Extensors Lie on your back on the floor. You may bend your knees for comfort. Place a rolled-up hand towel or dish towel, about 2 inches in diameter, under the part of your head that makes contact with the floor. Gently tuck your chin, as if trying to make a double chin, until you feel a gentle stretch at the base of your head. Hold 15-20 seconds. Repeat 2-3 times. Complete this exercise 1 time per day.   STRETCH - Axial Extension  Stand or sit on a firm surface. Assume a good posture: chest up, shoulders drawn back, abdominal muscles slightly tense, knees unlocked (if standing)  and feet hip width apart. Slowly retract your chin so your head slides back and your chin slightly lowers. Continue to look straight ahead. You should feel a gentle stretch in the back of your head. Be certain not to feel an aggressive stretch since this can cause headaches later. Hold for 15-20 seconds. Repeat 2-3 times. Complete this exercise 1 time per day.  STRETCH - Cervical Side Bend  Stand or sit on a firm surface. Assume a good posture: chest up, shoulders drawn back, abdominal muscles slightly tense, knees unlocked (if standing) and feet hip width apart. Without letting your nose or shoulders move, slowly tip your right / left ear to your shoulder until your feel a gentle stretch in the muscles on the opposite side of your neck. Hold 15-20 seconds. Repeat 2-3 times. Complete this exercise 1-2 times per day.  STRETCH - Cervical Rotators  Stand or sit on a firm surface. Assume a good posture: chest up, shoulders drawn back, abdominal muscles slightly tense, knees unlocked (if standing) and feet hip width apart. Keeping your eyes level with the ground, slowly turn your head until you feel a gentle stretch along the back and opposite side of your neck. Hold 15-20 seconds. Repeat 2-3 times. Complete this exercise 1-2 times per day.  RANGE OF MOTION - Neck Circles  Stand or sit on a firm surface. Assume a good posture: chest up, shoulders drawn back, abdominal muscles slightly tense, knees unlocked (if standing) and feet hip width apart. Gently roll your head down and around from the back of  one shoulder to the back of the other. The motion should never be forced or painful. Repeat the motion 10-20 times, or until you feel the neck muscles relax and loosen. Repeat 2-3 times. Complete the exercise 1-2 times per day. STRENGTHENING EXERCISES - Cervical Strain and Sprain These exercises may help you when beginning to rehabilitate your injury. They may resolve your symptoms with or without  further involvement from your physician, physical therapist, or athletic trainer. While completing these exercises, remember:  Muscles can gain both the endurance and the strength needed for everyday activities through controlled exercises. Complete these exercises as instructed by your physician, physical therapist, or athletic trainer. Progress the resistance and repetitions only as guided. You may experience muscle soreness or fatigue, but the pain or discomfort you are trying to eliminate should never worsen during these exercises. If this pain does worsen, stop and make certain you are following the directions exactly. If the pain is still present after adjustments, discontinue the exercise until you can discuss the trouble with your clinician.  STRENGTH - Cervical Flexors, Isometric Face a wall, standing about 6 inches away. Place a small pillow, a ball about 6-8 inches in diameter, or a folded towel between your forehead and the wall. Slightly tuck your chin and gently push your forehead into the soft object. Push only with mild to moderate intensity, building up tension gradually. Keep your jaw and forehead relaxed. Hold 10 to 20 seconds. Keep your breathing relaxed. Release the tension slowly. Relax your neck muscles completely before you start the next repetition. Repeat 2-3 times. Complete this exercise 1 time per day.  STRENGTH- Cervical Lateral Flexors, Isometric  Stand about 6 inches away from a wall. Place a small pillow, a ball about 6-8 inches in diameter, or a folded towel between the side of your head and the wall. Slightly tuck your chin and gently tilt your head into the soft object. Push only with mild to moderate intensity, building up tension gradually. Keep your jaw and forehead relaxed. Hold 10 to 20 seconds. Keep your breathing relaxed. Release the tension slowly. Relax your neck muscles completely before you start the next repetition. Repeat 2-3 times. Complete this  exercise 1 time per day.  STRENGTH - Cervical Extensors, Isometric  Stand about 6 inches away from a wall. Place a small pillow, a ball about 6-8 inches in diameter, or a folded towel between the back of your head and the wall. Slightly tuck your chin and gently tilt your head back into the soft object. Push only with mild to moderate intensity, building up tension gradually. Keep your jaw and forehead relaxed. Hold 10 to 20 seconds. Keep your breathing relaxed. Release the tension slowly. Relax your neck muscles completely before you start the next repetition. Repeat 2-3 times. Complete this exercise 1 time per day.  POSTURE AND BODY MECHANICS CONSIDERATIONS Keeping correct posture when sitting, standing or completing your activities will reduce the stress put on different body tissues, allowing injured tissues a chance to heal and limiting painful experiences. The following are general guidelines for improved posture. Your physician or physical therapist will provide you with any instructions specific to your needs. While reading these guidelines, remember: The exercises prescribed by your provider will help you have the flexibility and strength to maintain correct postures. The correct posture provides the optimal environment for your joints to work. All of your joints have less wear and tear when properly supported by a spine with good posture. This means  you will experience a healthier, less painful body. Correct posture must be practiced with all of your activities, especially prolonged sitting and standing. Correct posture is as important when doing repetitive low-stress activities (typing) as it is when doing a single heavy-load activity (lifting).  PROLONGED STANDING WHILE SLIGHTLY LEANING FORWARD When completing a task that requires you to lean forward while standing in one place for a long time, place either foot up on a stationary 2- to 4-inch high object to help maintain the best  posture. When both feet are on the ground, the low back tends to lose its slight inward curve. If this curve flattens (or becomes too large), then the back and your other joints will experience too much stress, fatigue more quickly, and can cause pain.   RESTING POSITIONS Consider which positions are most painful for you when choosing a resting position. If you have pain with flexion-based activities (sitting, bending, stooping, squatting), choose a position that allows you to rest in a less flexed posture. You would want to avoid curling into a fetal position on your side. If your pain worsens with extension-based activities (prolonged standing, working overhead), avoid resting in an extended position such as sleeping on your stomach. Most people will find more comfort when they rest with their spine in a more neutral position, neither too rounded nor too arched. Lying on a non-sagging bed on your side with a pillow between your knees, or on your back with a pillow under your knees will often provide some relief. Keep in mind, being in any one position for a prolonged period of time, no matter how correct your posture, can still lead to stiffness.  WALKING Walk with an upright posture. Your ears, shoulders, and hips should all line up. OFFICE WORK When working at a desk, create an environment that supports good, upright posture. Without extra support, muscles fatigue and lead to excessive strain on joints and other tissues.  CHAIR: A chair should be able to slide under your desk when your back makes contact with the back of the chair. This allows you to work closely. The chair's height should allow your eyes to be level with the upper part of your monitor and your hands to be slightly lower than your elbows. Body position: Your feet should make contact with the floor. If this is not possible, use a foot rest. Keep your ears over your shoulders. This will reduce stress on your neck and low back.

## 2024-05-22 NOTE — Progress Notes (Signed)
 Musculoskeletal Exam  Patient: Joseph [name] DOB: [birth year]  DOS: 05/22/2024  SUBJECTIVE:  Chief Complaint:   Chief Complaint  Patient presents with   Neck Pain    Neck Pain    Joseph [name] is a 68 y.o.  male for evaluation and treatment of neck pain.   Onset:  2 months ago. No inj or change in activity.  Location: L side of neck and into shoulder Character:  aching and tingling  Progression of issue:  has worsened Associated symptoms: turning head to certain positions exacerbates s/s's.  No bruising, redness, swelling.  Treatment: to date has been rest, Mobic , heat.   Neurovascular symptoms: no  Past Medical History:  Diagnosis Date   Arthritis    Complication of anesthesia    GERD (gastroesophageal reflux disease)    Heart murmur    as a child, pt states he had an ECHO about 30 years ago (1995) and it was normal   Hypertension    Mixed hyperlipidemia    Osteoarthritis    Pneumonia    PONV (postoperative nausea and vomiting)    Prediabetes    Rheumatic fever    Vertigo     Objective: VITAL SIGNS: BP 126/78 (BP Location: Left Arm, Patient Position: Sitting)   Pulse 88   Resp 16   Ht 6' 2 (1.88 m)   Wt 219 lb 3.2 oz (99.4 kg)   SpO2 95%   BMI 28.14 kg/m  Constitutional: Well formed, well developed. No acute distress. Thorax & Lungs: No accessory muscle use Musculoskeletal: neck.   Normal active range of motion: .   Normal passive range of motion: no decreased L rotation Tenderness to palpation: no decreased L rotation Deformity: no Ecchymosis: no Tests positive: none Tests negative: Spurling's Neurologic: Normal sensory function. No focal deficits noted. DTR's 3+ b/l in UE's. No clonus. 4/5 strength with elbow flexion on L, 5/5 strength otherwise.  Psychiatric: Normal mood. Age appropriate judgment and insight. Alert & oriented x 3.    Assessment:  Acute neck pain - Plan: DG Cervical Spine Complete  Weakness of left upper extremity - Plan: DG  Cervical Spine Complete  Plan: Concern for C6 involvement. Ck XR. Cont muscle relaxer prn. MRI if XR neg. Appreciate ortho, he has an appt coming up. Stretches/exercises, heat, ice, Tylenol .  F/u as originally scheduled. The patient voiced understanding and agreement to the plan.   Joseph Mt Goodrich, DO 05/22/24  3:36 PM

## 2024-05-23 ENCOUNTER — Encounter: Payer: Self-pay | Admitting: Family Medicine

## 2024-05-24 ENCOUNTER — Other Ambulatory Visit: Payer: Self-pay | Admitting: Family Medicine

## 2024-05-24 MED ORDER — METHOCARBAMOL 500 MG PO TABS: 500.0000 mg | ORAL_TABLET | Freq: Three times a day (TID) | ORAL | 1 refills | Status: AC | PRN

## 2024-05-28 ENCOUNTER — Other Ambulatory Visit: Payer: Self-pay | Admitting: Family Medicine

## 2024-05-28 DIAGNOSIS — I10 Essential (primary) hypertension: Secondary | ICD-10-CM

## 2024-05-29 ENCOUNTER — Ambulatory Visit: Payer: Self-pay | Admitting: Family Medicine

## 2024-05-29 ENCOUNTER — Ambulatory Visit (HOSPITAL_BASED_OUTPATIENT_CLINIC_OR_DEPARTMENT_OTHER)
Admission: RE | Admit: 2024-05-29 | Discharge: 2024-05-29 | Disposition: A | Source: Ambulatory Visit | Attending: Family Medicine

## 2024-05-29 DIAGNOSIS — M542 Cervicalgia: Secondary | ICD-10-CM

## 2024-05-29 DIAGNOSIS — R29898 Other symptoms and signs involving the musculoskeletal system: Secondary | ICD-10-CM

## 2024-06-01 ENCOUNTER — Other Ambulatory Visit: Payer: Self-pay

## 2024-06-01 ENCOUNTER — Ambulatory Visit: Payer: Self-pay | Admitting: Family Medicine

## 2024-06-01 DIAGNOSIS — M5030 Other cervical disc degeneration, unspecified cervical region: Secondary | ICD-10-CM

## 2024-06-06 ENCOUNTER — Encounter: Payer: Self-pay | Admitting: Orthopedic Surgery

## 2024-06-18 ENCOUNTER — Encounter: Payer: Self-pay | Admitting: Family Medicine

## 2024-06-18 ENCOUNTER — Telehealth: Payer: Self-pay

## 2024-06-18 NOTE — Telephone Encounter (Signed)
 Called pt was advised of MRI and given him NEUROSURGERY contact information  [address] [place] [address] [phone]  the

## 2024-07-04 ENCOUNTER — Ambulatory Visit: Admitting: Orthopedic Surgery

## 2024-07-19 ENCOUNTER — Ambulatory Visit: Admitting: Orthopedic Surgery

## 2024-08-01 ENCOUNTER — Ambulatory Visit: Admitting: Orthopedic Surgery
